# Patient Record
Sex: Female | Born: 1950 | Race: White | Hispanic: No | Marital: Married | Smoking: Former smoker
Health system: Southern US, Community
[De-identification: ages and names within clinical notes are randomized; demographics above are authoritative.]

## PROBLEM LIST (undated history)

## (undated) DIAGNOSIS — R87619 Unspecified abnormal cytological findings in specimens from cervix uteri: Secondary | ICD-10-CM

## (undated) DIAGNOSIS — I8390 Asymptomatic varicose veins of unspecified lower extremity: Secondary | ICD-10-CM

## (undated) DIAGNOSIS — E0590 Thyrotoxicosis, unspecified without thyrotoxic crisis or storm: Secondary | ICD-10-CM

## (undated) DIAGNOSIS — R0683 Snoring: Secondary | ICD-10-CM

## (undated) DIAGNOSIS — K5792 Diverticulitis of intestine, part unspecified, without perforation or abscess without bleeding: Secondary | ICD-10-CM

## (undated) DIAGNOSIS — K760 Fatty (change of) liver, not elsewhere classified: Secondary | ICD-10-CM

## (undated) DIAGNOSIS — A048 Other specified bacterial intestinal infections: Secondary | ICD-10-CM

## (undated) DIAGNOSIS — I700 Atherosclerosis of aorta: Secondary | ICD-10-CM

## (undated) DIAGNOSIS — I10 Essential (primary) hypertension: Secondary | ICD-10-CM

## (undated) DIAGNOSIS — H409 Unspecified glaucoma: Secondary | ICD-10-CM

## (undated) DIAGNOSIS — G2581 Restless legs syndrome: Secondary | ICD-10-CM

## (undated) DIAGNOSIS — F419 Anxiety disorder, unspecified: Secondary | ICD-10-CM

## (undated) DIAGNOSIS — Z8659 Personal history of other mental and behavioral disorders: Secondary | ICD-10-CM

## (undated) DIAGNOSIS — G4730 Sleep apnea, unspecified: Secondary | ICD-10-CM

## (undated) DIAGNOSIS — Z87898 Personal history of other specified conditions: Secondary | ICD-10-CM

## (undated) DIAGNOSIS — E079 Disorder of thyroid, unspecified: Secondary | ICD-10-CM

## (undated) DIAGNOSIS — K649 Unspecified hemorrhoids: Secondary | ICD-10-CM

## (undated) DIAGNOSIS — K589 Irritable bowel syndrome without diarrhea: Secondary | ICD-10-CM

## (undated) HISTORY — DX: Sleep apnea, unspecified: G47.30

## (undated) HISTORY — DX: Unspecified glaucoma: H40.9

## (undated) HISTORY — DX: Unspecified hemorrhoids: K64.9

## (undated) HISTORY — DX: Irritable bowel syndrome, unspecified: K58.9

## (undated) HISTORY — DX: Snoring: R06.83

## (undated) HISTORY — DX: Asymptomatic varicose veins of unspecified lower extremity: I83.90

## (undated) HISTORY — DX: Diverticulitis of intestine, part unspecified, without perforation or abscess without bleeding: K57.92

## (undated) HISTORY — DX: Atherosclerosis of aorta: I70.0

## (undated) HISTORY — PX: COLPOSCOPY: SHX161

## (undated) HISTORY — DX: Personal history of other mental and behavioral disorders: Z86.59

## (undated) HISTORY — DX: Unspecified abnormal cytological findings in specimens from cervix uteri: R87.619

## (undated) HISTORY — DX: Other specified bacterial intestinal infections: A04.8

## (undated) HISTORY — DX: Fatty (change of) liver, not elsewhere classified: K76.0

## (undated) HISTORY — DX: Thyrotoxicosis, unspecified without thyrotoxic crisis or storm: E05.90

---

## 1998-01-21 ENCOUNTER — Other Ambulatory Visit: Admission: RE | Admit: 1998-01-21 | Discharge: 1998-01-21 | Payer: Self-pay | Admitting: Obstetrics and Gynecology

## 1998-06-21 ENCOUNTER — Other Ambulatory Visit: Admission: RE | Admit: 1998-06-21 | Discharge: 1998-06-21 | Payer: Self-pay | Admitting: Obstetrics and Gynecology

## 1998-09-16 ENCOUNTER — Ambulatory Visit (HOSPITAL_COMMUNITY): Admission: RE | Admit: 1998-09-16 | Discharge: 1998-09-16 | Payer: Self-pay | Admitting: Obstetrics and Gynecology

## 1998-09-16 ENCOUNTER — Encounter: Payer: Self-pay | Admitting: Obstetrics and Gynecology

## 1998-12-20 ENCOUNTER — Other Ambulatory Visit: Admission: RE | Admit: 1998-12-20 | Discharge: 1998-12-20 | Payer: Self-pay | Admitting: Obstetrics and Gynecology

## 1999-02-10 ENCOUNTER — Encounter (INDEPENDENT_AMBULATORY_CARE_PROVIDER_SITE_OTHER): Payer: Self-pay | Admitting: Specialist

## 1999-02-10 ENCOUNTER — Other Ambulatory Visit: Admission: RE | Admit: 1999-02-10 | Discharge: 1999-02-10 | Payer: Self-pay | Admitting: Obstetrics and Gynecology

## 1999-06-19 ENCOUNTER — Other Ambulatory Visit: Admission: RE | Admit: 1999-06-19 | Discharge: 1999-06-19 | Payer: Self-pay | Admitting: Obstetrics and Gynecology

## 1999-09-26 ENCOUNTER — Emergency Department (HOSPITAL_COMMUNITY): Admission: EM | Admit: 1999-09-26 | Discharge: 1999-09-26 | Payer: Self-pay | Admitting: *Deleted

## 1999-10-04 ENCOUNTER — Emergency Department (HOSPITAL_COMMUNITY): Admission: EM | Admit: 1999-10-04 | Discharge: 1999-10-04 | Payer: Self-pay | Admitting: Emergency Medicine

## 1999-10-23 ENCOUNTER — Other Ambulatory Visit: Admission: RE | Admit: 1999-10-23 | Discharge: 1999-10-23 | Payer: Self-pay | Admitting: Obstetrics and Gynecology

## 2000-03-12 ENCOUNTER — Other Ambulatory Visit: Admission: RE | Admit: 2000-03-12 | Discharge: 2000-03-12 | Payer: Self-pay | Admitting: Obstetrics and Gynecology

## 2000-07-15 ENCOUNTER — Other Ambulatory Visit (HOSPITAL_COMMUNITY): Admission: RE | Admit: 2000-07-15 | Discharge: 2000-07-28 | Payer: Self-pay | Admitting: Psychiatry

## 2000-08-10 ENCOUNTER — Other Ambulatory Visit: Admission: RE | Admit: 2000-08-10 | Discharge: 2000-08-10 | Payer: Self-pay | Admitting: Obstetrics and Gynecology

## 2000-12-21 ENCOUNTER — Other Ambulatory Visit: Admission: RE | Admit: 2000-12-21 | Discharge: 2000-12-21 | Payer: Self-pay | Admitting: Obstetrics and Gynecology

## 2001-01-31 ENCOUNTER — Other Ambulatory Visit: Admission: RE | Admit: 2001-01-31 | Discharge: 2001-01-31 | Payer: Self-pay | Admitting: Obstetrics and Gynecology

## 2001-01-31 ENCOUNTER — Encounter (INDEPENDENT_AMBULATORY_CARE_PROVIDER_SITE_OTHER): Payer: Self-pay | Admitting: Specialist

## 2001-07-14 ENCOUNTER — Other Ambulatory Visit: Admission: RE | Admit: 2001-07-14 | Discharge: 2001-07-14 | Payer: Self-pay | Admitting: Obstetrics and Gynecology

## 2001-12-01 ENCOUNTER — Other Ambulatory Visit: Admission: RE | Admit: 2001-12-01 | Discharge: 2001-12-01 | Payer: Self-pay | Admitting: Obstetrics and Gynecology

## 2002-06-08 ENCOUNTER — Other Ambulatory Visit: Admission: RE | Admit: 2002-06-08 | Discharge: 2002-06-08 | Payer: Self-pay | Admitting: Obstetrics and Gynecology

## 2002-12-11 ENCOUNTER — Other Ambulatory Visit: Admission: RE | Admit: 2002-12-11 | Discharge: 2002-12-11 | Payer: Self-pay | Admitting: Obstetrics and Gynecology

## 2003-06-07 ENCOUNTER — Other Ambulatory Visit: Admission: RE | Admit: 2003-06-07 | Discharge: 2003-06-07 | Payer: Self-pay | Admitting: Obstetrics and Gynecology

## 2003-12-13 ENCOUNTER — Other Ambulatory Visit: Admission: RE | Admit: 2003-12-13 | Discharge: 2003-12-13 | Payer: Self-pay | Admitting: Obstetrics and Gynecology

## 2004-07-02 ENCOUNTER — Other Ambulatory Visit: Admission: RE | Admit: 2004-07-02 | Discharge: 2004-07-02 | Payer: Self-pay | Admitting: Obstetrics and Gynecology

## 2004-11-21 ENCOUNTER — Other Ambulatory Visit: Admission: RE | Admit: 2004-11-21 | Discharge: 2004-11-21 | Payer: Self-pay | Admitting: Obstetrics and Gynecology

## 2005-07-21 ENCOUNTER — Other Ambulatory Visit: Admission: RE | Admit: 2005-07-21 | Discharge: 2005-07-21 | Payer: Self-pay | Admitting: Obstetrics & Gynecology

## 2006-01-18 ENCOUNTER — Other Ambulatory Visit: Admission: RE | Admit: 2006-01-18 | Discharge: 2006-01-18 | Payer: Self-pay | Admitting: Obstetrics & Gynecology

## 2006-08-10 ENCOUNTER — Other Ambulatory Visit: Admission: RE | Admit: 2006-08-10 | Discharge: 2006-08-10 | Payer: Self-pay | Admitting: Obstetrics & Gynecology

## 2007-08-23 ENCOUNTER — Other Ambulatory Visit: Admission: RE | Admit: 2007-08-23 | Discharge: 2007-08-23 | Payer: Self-pay | Admitting: Obstetrics & Gynecology

## 2007-09-04 HISTORY — PX: LEEP: SHX91

## 2007-10-27 ENCOUNTER — Emergency Department (HOSPITAL_COMMUNITY): Admission: EM | Admit: 2007-10-27 | Discharge: 2007-10-27 | Payer: Self-pay | Admitting: Emergency Medicine

## 2008-01-04 ENCOUNTER — Other Ambulatory Visit: Admission: RE | Admit: 2008-01-04 | Discharge: 2008-01-04 | Payer: Self-pay | Admitting: Obstetrics & Gynecology

## 2008-04-18 ENCOUNTER — Other Ambulatory Visit: Admission: RE | Admit: 2008-04-18 | Discharge: 2008-04-18 | Payer: Self-pay | Admitting: Obstetrics & Gynecology

## 2008-08-27 ENCOUNTER — Other Ambulatory Visit: Admission: RE | Admit: 2008-08-27 | Discharge: 2008-08-27 | Payer: Self-pay | Admitting: Obstetrics & Gynecology

## 2009-10-03 ENCOUNTER — Encounter: Admission: RE | Admit: 2009-10-03 | Discharge: 2009-10-03 | Payer: Self-pay | Admitting: Gastroenterology

## 2011-11-11 LAB — HM PAP SMEAR

## 2011-11-13 LAB — HM DEXA SCAN: HM Dexa Scan: NORMAL

## 2012-08-29 LAB — HM MAMMOGRAPHY

## 2012-09-20 ENCOUNTER — Emergency Department (HOSPITAL_COMMUNITY): Payer: BC Managed Care – PPO

## 2012-09-20 ENCOUNTER — Emergency Department (HOSPITAL_COMMUNITY)
Admission: EM | Admit: 2012-09-20 | Discharge: 2012-09-20 | Disposition: A | Discharge status: Home / Self Care | Payer: BC Managed Care – PPO | Attending: Emergency Medicine | Admitting: Emergency Medicine

## 2012-09-20 ENCOUNTER — Encounter (HOSPITAL_COMMUNITY): Payer: Self-pay | Admitting: *Deleted

## 2012-09-20 DIAGNOSIS — R42 Dizziness and giddiness: Secondary | ICD-10-CM

## 2012-09-20 DIAGNOSIS — Z8669 Personal history of other diseases of the nervous system and sense organs: Secondary | ICD-10-CM | POA: Insufficient documentation

## 2012-09-20 DIAGNOSIS — I10 Essential (primary) hypertension: Secondary | ICD-10-CM | POA: Insufficient documentation

## 2012-09-20 DIAGNOSIS — R112 Nausea with vomiting, unspecified: Secondary | ICD-10-CM | POA: Insufficient documentation

## 2012-09-20 DIAGNOSIS — Z79899 Other long term (current) drug therapy: Secondary | ICD-10-CM | POA: Insufficient documentation

## 2012-09-20 DIAGNOSIS — Z7982 Long term (current) use of aspirin: Secondary | ICD-10-CM | POA: Insufficient documentation

## 2012-09-20 DIAGNOSIS — Z87891 Personal history of nicotine dependence: Secondary | ICD-10-CM | POA: Insufficient documentation

## 2012-09-20 DIAGNOSIS — E079 Disorder of thyroid, unspecified: Secondary | ICD-10-CM | POA: Insufficient documentation

## 2012-09-20 DIAGNOSIS — F411 Generalized anxiety disorder: Secondary | ICD-10-CM | POA: Insufficient documentation

## 2012-09-20 HISTORY — DX: Anxiety disorder, unspecified: F41.9

## 2012-09-20 HISTORY — DX: Essential (primary) hypertension: I10

## 2012-09-20 HISTORY — DX: Disorder of thyroid, unspecified: E07.9

## 2012-09-20 HISTORY — DX: Personal history of other specified conditions: Z87.898

## 2012-09-20 HISTORY — DX: Restless legs syndrome: G25.81

## 2012-09-20 LAB — CBC WITH DIFFERENTIAL/PLATELET
Basophils Absolute: 0 10*3/uL (ref 0.0–0.1)
Lymphocytes Relative: 14 % (ref 12–46)
Lymphs Abs: 1.2 10*3/uL (ref 0.7–4.0)
MCV: 89.6 fL (ref 78.0–100.0)
Neutro Abs: 7 10*3/uL (ref 1.7–7.7)
Platelets: 182 10*3/uL (ref 150–400)
RBC: 4.44 MIL/uL (ref 3.87–5.11)
RDW: 13.1 % (ref 11.5–15.5)
WBC: 8.5 10*3/uL (ref 4.0–10.5)

## 2012-09-20 LAB — URINALYSIS, ROUTINE W REFLEX MICROSCOPIC
Glucose, UA: NEGATIVE mg/dL
Leukocytes, UA: NEGATIVE
Nitrite: NEGATIVE
Specific Gravity, Urine: 1.022 (ref 1.005–1.030)
pH: 8 (ref 5.0–8.0)

## 2012-09-20 LAB — BASIC METABOLIC PANEL
CO2: 23 mEq/L (ref 19–32)
Calcium: 9.1 mg/dL (ref 8.4–10.5)
Chloride: 100 mEq/L (ref 96–112)
Creatinine, Ser: 0.57 mg/dL (ref 0.50–1.10)
GFR calc Af Amer: >90 mL/min (ref >90)
Sodium: 136 mEq/L (ref 135–145)

## 2012-09-20 MED ORDER — PANTOPRAZOLE SODIUM 40 MG IV SOLR
40.0000 mg | Freq: Once | INTRAVENOUS | Status: AC
  Administered 2012-09-20: 40 mg via INTRAVENOUS
  Filled 2012-09-20: qty 40

## 2012-09-20 MED ORDER — MECLIZINE HCL 50 MG PO TABS: 25.0000 mg | ORAL_TABLET | Freq: Three times a day (TID) | ORAL | Status: DC | PRN

## 2012-09-20 MED ORDER — ONDANSETRON 8 MG PO TBDP: ORAL_TABLET | ORAL | Status: DC

## 2012-09-20 MED ORDER — ONDANSETRON HCL 4 MG/2ML IJ SOLN
4.0000 mg | Freq: Once | INTRAMUSCULAR | Status: AC
  Administered 2012-09-20: 4 mg via INTRAVENOUS
  Filled 2012-09-20: qty 2

## 2012-09-20 MED ORDER — MECLIZINE HCL 25 MG PO TABS
25.0000 mg | ORAL_TABLET | Freq: Once | ORAL | Status: AC
  Administered 2012-09-20: 25 mg via ORAL
  Filled 2012-09-20: qty 1

## 2012-09-20 MED ORDER — SODIUM CHLORIDE 0.9 % IV BOLUS (SEPSIS)
1000.0000 mL | Freq: Once | INTRAVENOUS | Status: AC
  Administered 2012-09-20: 1000 mL via INTRAVENOUS

## 2012-09-20 MED ORDER — ALPRAZOLAM 0.5 MG PO TABS: 0.5000 mg | ORAL_TABLET | Freq: Three times a day (TID) | ORAL | Status: DC | PRN

## 2012-09-20 NOTE — ED Notes (Signed)
Patient unable to provide urine sample at this time

## 2012-09-20 NOTE — ED Notes (Signed)
Patient transported to CT 

## 2012-09-20 NOTE — ED Notes (Signed)
[id number]> Expected date:<BR> Expected time:<BR> Means of arrival:<BR> Comments:<BR> 62 y/o F dizziness x 8 hrs

## 2012-09-20 NOTE — ED Notes (Signed)
Per EMS, pt brought from home with reports of N/V and dizziness that has worsened since midnight. Per EMS, pt denies fever, diarrhea and cough.

## 2012-09-20 NOTE — Progress Notes (Signed)
Pt confirms pcp is [name] [facility] updated

## 2012-09-28 NOTE — ED Provider Notes (Signed)
History     CSN: [id number]  Arrival date & time 09/20/12  0809   First MD Initiated Contact with Patient 09/20/12 [phone]      Chief Complaint  Patient presents with  . Nausea  . Emesis  . Dizziness    (Consider location/radiation/quality/duration/timing/severity/associated sxs/prior treatment) HPI.... Nausea, dizziness for several hours.   No motor or sensory deficits. Patient is ambulatory. No fever, sweats, chills, stiff neck. Certain positions make it worse. Severity is mild to moderate  Past Medical History  Diagnosis Date  . Thyroid disease   . Hypertension   . H/O dizziness   . Anxiety   . Restless leg syndrome     Past Surgical History  Procedure Laterality Date  . None      History reviewed. No pertinent family history.  History  Substance Use Topics  . Smoking status: Former Smoker    Quit date: 09/21/1978  . Smokeless tobacco: Never Used  . Alcohol Use: No    OB History   Grav Para Term Preterm Abortions TAB SAB Ect Mult Living                  Review of Systems  All other systems reviewed and are negative.    Allergies  Sulfites  Home Medications   Current Outpatient Rx  Name  Route  Sig  Dispense  Refill  . ALPRAZolam (XANAX) 0.5 MG tablet   Oral   Take 0.5 mg by mouth 3 (three) times daily as needed for anxiety.         Marland Kitchen aspirin EC 81 MG tablet   Oral   Take 81 mg by mouth daily.         . Calcium-Magnesium-Vitamin D (CALCIUM MAGNESIUM PO)   Oral   Take 1 tablet by mouth daily.         . hydrochlorothiazide (HYDRODIURIL) 25 MG tablet   Oral   Take 25 mg by mouth daily.         Marland Kitchen levothyroxine (SYNTHROID, LEVOTHROID) 150 MCG tablet   Oral   Take 150 mcg by mouth daily.         . Multiple Vitamin (MULTIVITAMIN WITH MINERALS) TABS   Oral   Take 1 tablet by mouth daily.         Marland Kitchen omega-3 acid ethyl esters (LOVAZA) 1 G capsule   Oral   Take 1 g by mouth daily.         Marland Kitchen ALPRAZolam (XANAX) 0.5 MG tablet  Oral   Take 1 tablet (0.5 mg total) by mouth 3 (three) times daily as needed for anxiety.   20 tablet   0   . meclizine (ANTIVERT) 50 MG tablet   Oral   Take 0.5 tablets (25 mg total) by mouth 3 (three) times daily as needed for dizziness.   30 tablet   0   . ondansetron (ZOFRAN ODT) 8 MG disintegrating tablet      8mg  ODT q4 hours prn nausea   10 tablet   0     BP 137/68  Pulse 75  Temp(Src) 97.9 F (36.6 C) (Oral)  Resp 18  SpO2 99%  Physical Exam  Nursing note and vitals reviewed. Constitutional: She is oriented to person, place, and time. She appears well-developed and well-nourished.  HENT:  Head: Normocephalic and atraumatic.  Eyes: Conjunctivae and EOM are normal. Pupils are equal, round, and reactive to light.  Neck: Normal range of motion. Neck supple.  Cardiovascular: Normal  rate, regular rhythm and normal heart sounds.   Pulmonary/Chest: Effort normal and breath sounds normal.  Abdominal: Soft. Bowel sounds are normal.  Musculoskeletal: Normal range of motion.  Neurological: She is alert and oriented to person, place, and time.  Skin: Skin is warm and dry.  Psychiatric: She has a normal mood and affect.    ED Course  Procedures (including critical care time)  Labs Reviewed  BASIC METABOLIC PANEL - Abnormal; Notable for the following:    Glucose, Bld 182 (*)    All other components within normal limits  CBC WITH DIFFERENTIAL - Abnormal; Notable for the following:    Neutrophils Relative 83 (*)    All other components within normal limits  URINALYSIS, ROUTINE W REFLEX MICROSCOPIC - Abnormal; Notable for the following:    Ketones, ur 40 (*)    All other components within normal limits   No results found.   1. Vertigo       MDM  No frank neuro deficits. CT head negative. Screening labs normal. Discharge meds include meclizine, Xanax, Zofran         Donnetta Hutching, MD 09/28/12 [phone]

## 2012-11-17 ENCOUNTER — Ambulatory Visit (INDEPENDENT_AMBULATORY_CARE_PROVIDER_SITE_OTHER): Payer: BC Managed Care – PPO | Admitting: Neurology

## 2012-11-17 ENCOUNTER — Encounter: Payer: Self-pay | Admitting: Neurology

## 2012-11-17 ENCOUNTER — Other Ambulatory Visit: Payer: Self-pay | Admitting: *Deleted

## 2012-11-17 DIAGNOSIS — G4733 Obstructive sleep apnea (adult) (pediatric): Secondary | ICD-10-CM | POA: Insufficient documentation

## 2012-11-17 DIAGNOSIS — R0609 Other forms of dyspnea: Secondary | ICD-10-CM

## 2012-11-17 DIAGNOSIS — H8190 Unspecified disorder of vestibular function, unspecified ear: Secondary | ICD-10-CM

## 2012-11-17 DIAGNOSIS — J988 Other specified respiratory disorders: Secondary | ICD-10-CM

## 2012-11-17 DIAGNOSIS — R0989 Other specified symptoms and signs involving the circulatory and respiratory systems: Secondary | ICD-10-CM

## 2012-11-17 DIAGNOSIS — R0683 Snoring: Secondary | ICD-10-CM

## 2012-11-17 DIAGNOSIS — H8109 Meniere's disease, unspecified ear: Secondary | ICD-10-CM | POA: Insufficient documentation

## 2012-11-17 DIAGNOSIS — E669 Obesity, unspecified: Secondary | ICD-10-CM

## 2012-11-17 HISTORY — DX: Obstructive sleep apnea (adult) (pediatric): G47.33

## 2012-11-17 HISTORY — DX: Snoring: R06.83

## 2012-11-17 NOTE — Patient Instructions (Addendum)
I think overall you are doing fairly well but I do want to suggest a few things today:  If your vertigo recurs, we will consider a MRI brain.  Remember to drink plenty of fluid, eat healthy meals and do not skip any meals. Try to eat protein with a every meal and eat a healthy snack such as fruit or nuts in between meals. Try to keep a regular sleep-wake schedule and try to exercise daily, particularly in the form of walking, 20-30 minutes a day, if you can.   As far as your medications are concerned, I would like to suggest: no changes.    As far as diagnostic testing: consider a sleep study. Based on your symptoms and your exam I believe you are at risk for obstructive sleep apnea or OSA, and I think we should proceed with a sleep study to determine whether you do or do not have OSA and how severe it is. If you have more than mild OSA, I want you to consider treatment with CPAP. Please remember, the risks and ramifications of moderate to severe obstructive sleep apnea or OSA are: Cardiovascular disease, including congestive heart failure, stroke, difficult to control hypertension, arrhythmias, and even type 2 diabetes has been linked to untreated OSA. Sleep apnea causes disruption of sleep and sleep deprivation in most cases, which, in turn, can cause recurrent headaches, problems with memory, mood, concentration, focus, and vigilance. Most people with untreated sleep apnea report excessive daytime sleepiness, which can affect their ability to drive. Please do not drive if you feel sleepy. Call me if you decide to go through with the sleep study; we will be able to do this at [facility].   I would like to see you back in 6 months, sooner if we need to. Please call us with any interim questions, concerns, problems, updates or refill requests.  Brett Canales is my clinical assistant and will answer any of your questions and relay your messages to me and also relay most of my messages to you.  Our phone number is  [phone]. We also have an after hours call service for urgent matters and there is a physician on-call for urgent questions. For any emergencies you know to call 911 or go to the nearest emergency room.

## 2012-11-17 NOTE — Progress Notes (Signed)
Subjective:    Patient ID: Jodi [name] is a 62 y.o. female.  HPI  [name], MD, PhD [facility] [address] [address] [place], [place] [address]  Dear Dr. Katrinka Blazing,   I saw your patient, Jodi [name], upon your kind request in my neurologic clinic today for initial consultation of her vertigo. The patient is unaccompanied today. As you know, [name] is a very pleasant 62 year old right-handed woman with an underlying medical history of hypertension, hypothyroidism, hyperlipidemia and anxiety, who had a sudden attack of vertigo some 8 weeks ago. This started at 11:30 PM while doing the dishes and associated with nausea/vomiting and she did go to bed with similar symptoms and in the morning had to be taking to the ER via ambulance and was found to be dehydrated and had a CTH without contrast which was negative. She was given meclizine, which she took for about 2 days, but it made her very sleepy. She has been steadily improving. Her visual disturbance consisted of blurry vision and jumpy eyes. Years ago she had mild vertigo. She has recently been given new glasses with prism on the L and this has helped her vision. She did have a sinus congestion with mucus production during the month of February, approximately a month before the onset of her vertigo. She has been fatigued for about a month as well. She has had occasional tension type headache in the back of her head. She currently does not have any vertiginous symptoms. Never had a TIA or a Stroke, and denies one sided weakness, numbness, tingling, slurring of speech or droopy face and denies recurrent headaches.  She did not have any hearing loss, ear fullness, ear pain or tinnitus. Her current medications are facial, multivitamin, calcium, coenzyme Q10, probiotic, magnesium, baby aspirin, Synthroid, hydrochlorothiazide, turmeric. She was recently restarted on Wellbutrin, but could not tolerate the  long-acting as it exacerbated her anxiety and depression.  Of note, she denies any excessive daytime somnolence but feels tired during the day. Upon further asking, she snores and has done so for years. She has not been told that she has witnessed apneas but has woken herself up from her own snoring and choking episodes. She has considered a sleep study in the past. She is in the process of losing weight. She has lost a little bit. She is to see her back in a couple months for weight check and cholesterol recheck. She goes to bed late. Often after midnight. She does not feel rested in the mornings but denies recurrent morning headaches. She has had difficulty focusing.  Her Past Medical History Is Significant For: Past Medical History  Diagnosis Date  . Thyroid disease   . Hypertension   . H/O dizziness   . Anxiety   . Restless leg syndrome     Her Past Surgical History Is Significant For: Past Surgical History  Procedure Laterality Date  . None      Her Family History Is Significant For: No family history on file.  Her Social History Is Significant For: History   Social History  . Marital Status: Married    Spouse Name: N/A    Number of Children: N/A  . Years of Education: N/A   Social History Main Topics  . Smoking status: Former Smoker    Quit date: 09/21/1978  . Smokeless tobacco: Never Used  . Alcohol Use: No  . Drug Use: No  . Sexually Active: None   Other  Topics Concern  . None   Social History Narrative  . None    Her Allergies Are:  Allergies  Allergen Reactions  . Sulfites Anaphylaxis  :   Her Current Medications Are:  Outpatient Encounter Prescriptions as of 11/17/2012  Medication Sig Dispense Refill  . ALPRAZolam (XANAX) 0.5 MG tablet Take 1 tablet (0.5 mg total) by mouth 3 (three) times daily as needed for anxiety.  20 tablet  0  . aspirin EC 81 MG tablet Take 81 mg by mouth daily.      . Calcium-Magnesium-Vitamin D (CALCIUM MAGNESIUM PO) Take 1  tablet by mouth daily.      . hydrochlorothiazide (HYDRODIURIL) 25 MG tablet Take 25 mg by mouth daily.      Marland Kitchen levothyroxine (SYNTHROID, LEVOTHROID) 150 MCG tablet Take 150 mcg by mouth daily.      . Multiple Vitamin (MULTIVITAMIN WITH MINERALS) TABS Take 1 tablet by mouth daily.      Marland Kitchen omega-3 acid ethyl esters (LOVAZA) 1 G capsule Take 1 g by mouth daily.      . [DISCONTINUED] ALPRAZolam (XANAX) 0.5 MG tablet Take 0.5 mg by mouth 3 (three) times daily as needed for anxiety.      Marland Kitchen buPROPion (WELLBUTRIN) 100 MG tablet       . [DISCONTINUED] meclizine (ANTIVERT) 50 MG tablet Take 0.5 tablets (25 mg total) by mouth 3 (three) times daily as needed for dizziness.  30 tablet  0  . [DISCONTINUED] ondansetron (ZOFRAN ODT) 8 MG disintegrating tablet 8mg  ODT q4 hours prn nausea  10 tablet  0   No facility-administered encounter medications on file as of 11/17/2012.  :  Review of Systems  Constitutional: Positive for fatigue.  Cardiovascular: Positive for leg swelling.  Musculoskeletal: Positive for arthralgias.  Neurological:       Restless leg  Psychiatric/Behavioral: The patient is nervous/anxious.        Sleepines    Objective:  Neurologic Exam  Physical Exam Physical Examination:   Filed Vitals:   11/17/12 0957  BP: 151/77  Pulse: 94  Temp: 98.3 F (36.8 C)    General Examination: The patient is a very pleasant 62 y.o. female in no acute distress. She appears well-developed and well-nourished and well groomed. She is anxious appearing. She is overweight.   HEENT: Normocephalic, atraumatic, pupils are equal, round and reactive to light and accommodation. Funduscopic exam is normal with sharp disc margins noted. Extraocular tracking is good without limitation to gaze excursion or nystagmus noted. Normal smooth pursuit is noted. Hearing is grossly intact. Tympanic membranes are clear bilaterally. Face is symmetric with normal facial animation and normal facial sensation. Speech is clear  with no dysarthria noted. There is no hypophonia. There is no lip, neck/head, jaw or voice tremor. Neck is supple with full range of passive and active motion. There are no carotid bruits on auscultation. Oropharynx exam reveals: good dental hygiene and marked airway crowding, due to redundant soft palate and elongated uvula; I did not see the tip of her uvula and tongue is enlarged. Mallampati is class II. Tongue protrudes centrally and palate elevates symmetrically. Tonsils are small or absent. Neck size is 16 inches. She has no nystagmus or vertigo with sudden changes in posture.  Chest: Clear to auscultation without wheezing, rhonchi or crackles noted.  Heart: S1+S2+0, regular and normal without murmurs, rubs or gallops noted.   Abdomen: Soft, non-tender and non-distended with normal bowel sounds appreciated on auscultation.  Extremities: There is 2+ pitting edema  in the distal lower extremities bilaterally. Pedal pulses are intact.  Skin: Warm and dry without trophic changes noted. There are no varicose veins, but has multiple spider veins.  Musculoskeletal: exam reveals no obvious joint deformities, tenderness or joint swelling or erythema.   Neurologically:  Mental status: The patient is awake, alert and oriented in all 4 spheres. Her memory, attention, language and knowledge are appropriate. There is no aphasia, agnosia, apraxia or anomia. Speech is clear with normal prosody and enunciation. Thought process is linear. Mood is congruent and affect is normal.  Cranial nerves are as described above under HEENT exam. In addition, shoulder shrug is normal with equal shoulder height noted. Motor exam: Normal bulk, strength and tone is noted. There is no drift, tremor or rebound. Romberg is negative. Reflexes are 2+ throughout. Toes are downgoing bilaterally. Fine motor skills are intact with normal finger taps, normal hand movements, normal rapid alternating patting, normal foot taps and normal  foot agility.  Cerebellar testing shows no dysmetria or intention tremor on finger to nose testing. Heel to shin is unremarkable bilaterally. There is no truncal or gait ataxia.  Sensory exam is intact to light touch, pinprick, vibration, temperature sense and proprioception in the upper and lower extremities.  Gait, station and balance are unremarkable. No veering to one side is noted. No leaning to one side is noted. Posture is age-appropriate and stance is narrow based. No problems turning are noted. She turns en bloc. Tandem walk is unremarkable. Intact toe and heel stance is noted.               Assessment and Plan:   Assessment and Plan:  In summary, MAYAH URQUIDI is a very pleasant 62 y.o.-year old female with a history of sudden onset of vertigo in March. She has steadily improved. Given that she had a prodromal sinus congestion and gradual improvement associated fatigue reported I believe that she has had an episode of viral labyrinthitis or vestibular neuronitis rather than positional vertigo. I do believe that her exam looks well at this time and reassured her in that regard. I think watchful waiting is warranted at this time. If her vertigo recurs we will pursue a brain MRI but she is quite claustrophobic and worries about that. Also I believe she is at risk for obstructive sleep apnea. I suggested a sleep study but she would like to pursue weight loss a little bit more aggressively for now. If she has ongoing issues with tiredness and fatigue despite improvement of her vertigo symptoms I would like to pursue the sleep study with her. She is in agreement. If she decides to go ahead with a sleep study she will call us. For now I have suggested a six-month checkup to touch base with her again. She was in agreement. I answered all her questions and explained sleep test procedure and the risks and ramifications of untreated moderate to severe OSA including cardiovascular disease down the Road. I  also explained to her how obstructive sleep apnea may adversely impact her mood and her cognitive skills. Thank you very much for allowing me to participate in the care of this nice patient. If I can be of any further assistance to you please do not hesitate to call me at [phone].  Sincerely,   [name], MD, PhD

## 2012-11-30 ENCOUNTER — Ambulatory Visit: Payer: Self-pay | Admitting: Obstetrics & Gynecology

## 2012-12-01 ENCOUNTER — Encounter: Payer: Self-pay | Admitting: Obstetrics & Gynecology

## 2012-12-02 ENCOUNTER — Ambulatory Visit (INDEPENDENT_AMBULATORY_CARE_PROVIDER_SITE_OTHER): Payer: BC Managed Care – PPO | Admitting: Obstetrics & Gynecology

## 2012-12-02 ENCOUNTER — Encounter: Payer: Self-pay | Admitting: Obstetrics & Gynecology

## 2012-12-02 VITALS — BP 140/76 | Ht 65.75 in | Wt 209.0 lb

## 2012-12-02 DIAGNOSIS — Z124 Encounter for screening for malignant neoplasm of cervix: Secondary | ICD-10-CM

## 2012-12-02 DIAGNOSIS — Z01419 Encounter for gynecological examination (general) (routine) without abnormal findings: Secondary | ICD-10-CM

## 2012-12-02 DIAGNOSIS — Z0000 Encounter for general adult medical examination without abnormal findings: Secondary | ICD-10-CM

## 2012-12-02 LAB — POCT URINALYSIS DIPSTICK
Bilirubin, UA: NEGATIVE
Blood, UA: NEGATIVE
Leukocytes, UA: NEGATIVE
Nitrite, UA: NEGATIVE
Protein, UA: NEGATIVE
Urobilinogen, UA: NEGATIVE
pH, UA: 7

## 2012-12-02 NOTE — Patient Instructions (Signed)

## 2012-12-02 NOTE — Progress Notes (Signed)
Patient ID: Jodi [name], female   DOB: [birth year], 62 y.o.   MRN: [id number]  62 y.o. G1P1 MarriedCaucasianF here for annual exam.  No vaginal bleeding.  Has been to the ER for vertigo 09/20/12.  Diagnosed as viral vestibular neuritis.  She has also had some fatigue.  This is much better.  No LMP recorded. Patient is postmenopausal.          Sexually active: no  The current method of family planning is none.    Exercising: no  walking Smoker:  no  Health Maintenance: Pap:  5/13 neg History of abnormal Pap:  Yes, LEEP 3/09 Cin II/III MMG:  08/29/12 Colonoscopy:  Never.  Declines. BMD:   11/2011 TDaP:  2010 Screening Labs: PCP--Candace Smith early 2014, Hb today: PCP, Urine today: negative   reports that she quit smoking about 34 years ago. She has never used smokeless tobacco. She reports that she does not drink alcohol or use illicit drugs.  Past Medical History  Diagnosis Date  . Thyroid disease     hypothyroid  . Hypertension   . H/O dizziness   . Anxiety   . Restless leg syndrome   . Snoring 11/17/2012  . History of depression     incest survivor/dysthymia  . IBS (irritable bowel syndrome)     lactose intolerance  . Fatty liver disease, nonalcoholic     Past Surgical History  Procedure Laterality Date  . 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 Colposcopy  99,00,02,04,06  . Endometrial biopsy  2/09, 1/12    09-NEG, 2012-benign proliferative endometrium    Current Outpatient Prescriptions  Medication Sig Dispense Refill  . ALPRAZolam (XANAX) 0.5 MG tablet Take 1 tablet (0.5 mg total) by mouth 3 (three) times daily as needed for anxiety.  20 tablet  0  . aspirin EC 81 MG tablet Take 81 mg by mouth daily.      . Calcium-Magnesium-Vitamin D (CALCIUM MAGNESIUM PO) Take 1 tablet by mouth daily.      . hydrochlorothiazide (HYDRODIURIL) 25 MG tablet Take 25 mg by mouth daily.      Marland Kitchen levothyroxine (SYNTHROID, LEVOTHROID) 150 MCG tablet Take 150 mcg by mouth daily.      . Multiple  Vitamin (MULTIVITAMIN WITH MINERALS) TABS Take 1 tablet by mouth daily.      . Omega-3 Fatty Acids (FISH OIL) 1000 MG CAPS Take 1 capsule by mouth daily.       No current facility-administered medications for this visit.    Family History  Problem Relation Age of Onset  . Hypertension Father   . Diabetes Father   . Parkinson's disease Mother   . Dementia Mother   . Breast cancer Cousin     maternal side of family  . Dementia Brother   . Dementia Father     ROS:  Pertinent items are noted in HPI.  Otherwise, a comprehensive ROS was negative.  Exam:   BP 140/76  Ht 5' 5.75" (1.67 m)  Wt 209 lb (94.802 kg)  BMI 33.99 kg/m2  Weight change: no change  Height: 5' 5.75" (167 cm)  Ht Readings from Last 3 Encounters:  12/02/12 5' 5.75" (1.67 m)  11/17/12 5' 6.5" (1.689 m)    General appearance: alert, cooperative and appears stated age Head: Normocephalic, without obvious abnormality, atraumatic Neck: no adenopathy, supple, symmetrical, trachea midline and thyroid normal to inspection and palpation Lungs: clear to auscultation bilaterally Breasts: normal appearance, no masses or tenderness Heart:  regular rate and rhythm Abdomen: soft, non-tender; bowel sounds normal; no masses,  no organomegaly Extremities: extremities normal, atraumatic, no cyanosis or edema Skin: Skin color, texture, turgor normal. No rashes or lesions Lymph nodes: Cervical, supraclavicular, and axillary nodes normal. No abnormal inguinal nodes palpated Neurologic: Grossly normal   Pelvic: External genitalia:  no lesions              Urethra:  normal appearing urethra with no masses, tenderness or lesions              Bartholins and Skenes: normal                 Vagina: normal appearing vagina with normal color and discharge, no lesions              Cervix: no lesions and well healed from LEEP              Pap taken: yes Bimanual Exam:  Uterus:  normal size, contour, position, consistency, mobility,  non-tender              Adnexa: normal adnexa and no mass, fullness, tenderness               Rectovaginal: Confirms               Anus:  normal sphincter tone, no lesions  A:  Well Woman with normal exam PMP, No HRT 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 pap smear, only, obtained today Declines colonoscopy.  OC light given today. return annually or prn  An After Visit Summary was printed and given to the patient.

## 2012-12-07 ENCOUNTER — Telehealth: Payer: Self-pay

## 2012-12-07 NOTE — Telephone Encounter (Signed)
6/4 lmtcb//kn 

## 2012-12-14 NOTE — Telephone Encounter (Signed)
Calling for results from pap.

## 2012-12-15 NOTE — Telephone Encounter (Signed)
Returning call.

## 2012-12-16 ENCOUNTER — Telehealth: Payer: Self-pay | Admitting: *Deleted

## 2012-12-16 NOTE — Telephone Encounter (Signed)
Message copied by Alisa Graff on Fri Dec 16, 2012  6:45 PM ------      Message from: Jerene Bears      Created: Tue Dec 06, 2012  1:31 PM       02.  Inform normal. ------

## 2012-12-16 NOTE — Telephone Encounter (Signed)
Patient calling after looking at pap result on my chart.  Concerned about "atropic changes"  That is noted in comment section.  Discussed this is a normal finding in postmenopausal women as a lack of estrogen has an effect of the cells.  Reassured normal pap.

## 2012-12-23 NOTE — Telephone Encounter (Signed)
6/20 per sy,RN patient notified of her pap result.

## 2012-12-23 NOTE — Telephone Encounter (Signed)
Patient notified of pap results.

## 2013-01-27 ENCOUNTER — Encounter: Payer: Self-pay | Admitting: Obstetrics & Gynecology

## 2013-01-27 ENCOUNTER — Telehealth: Payer: Self-pay | Admitting: Obstetrics & Gynecology

## 2013-01-27 NOTE — Telephone Encounter (Signed)
Pt calling to get results of her IFOB sample.

## 2013-01-30 NOTE — Telephone Encounter (Signed)
Kelly can you help with this

## 2013-02-02 NOTE — Telephone Encounter (Signed)
Dr Hyacinth Meeker, I ran her IFOB and called her to let her know the results are negative. I will do an encounter for this.

## 2013-05-11 ENCOUNTER — Other Ambulatory Visit: Payer: Self-pay

## 2013-05-22 ENCOUNTER — Ambulatory Visit: Payer: BC Managed Care – PPO | Admitting: Neurology

## 2013-12-21 ENCOUNTER — Encounter: Payer: Self-pay | Admitting: Obstetrics & Gynecology

## 2013-12-21 ENCOUNTER — Ambulatory Visit (INDEPENDENT_AMBULATORY_CARE_PROVIDER_SITE_OTHER): Payer: BC Managed Care – PPO | Admitting: Obstetrics & Gynecology

## 2013-12-21 VITALS — BP 140/80 | HR 68 | Resp 16 | Ht 65.75 in | Wt 218.6 lb

## 2013-12-21 DIAGNOSIS — R87611 Atypical squamous cells cannot exclude high grade squamous intraepithelial lesion on cytologic smear of cervix (ASC-H): Secondary | ICD-10-CM

## 2013-12-21 DIAGNOSIS — Z1211 Encounter for screening for malignant neoplasm of colon: Secondary | ICD-10-CM

## 2013-12-21 DIAGNOSIS — Z124 Encounter for screening for malignant neoplasm of cervix: Secondary | ICD-10-CM

## 2013-12-21 DIAGNOSIS — Z0000 Encounter for general adult medical examination without abnormal findings: Secondary | ICD-10-CM

## 2013-12-21 DIAGNOSIS — Z01419 Encounter for gynecological examination (general) (routine) without abnormal findings: Secondary | ICD-10-CM

## 2013-12-21 LAB — HEMOGLOBIN, FINGERSTICK: Hemoglobin, fingerstick: 14.2 g/dL (ref 12.0–16.0)

## 2013-12-21 LAB — POCT URINALYSIS DIPSTICK
Bilirubin, UA: NEGATIVE
GLUCOSE UA: NEGATIVE
Ketones, UA: NEGATIVE
NITRITE UA: NEGATIVE
PROTEIN UA: NEGATIVE
RBC UA: NEGATIVE
UROBILINOGEN UA: NEGATIVE
pH, UA: 5

## 2013-12-21 NOTE — Patient Instructions (Signed)

## 2013-12-21 NOTE — Progress Notes (Signed)
63 y.o. G1P1 MarriedCaucasianF here for annual exam.  Doing well.  No vaginal bleeding.  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  No vaginal bleeding.  Does take BP at home.  Notes it is almost always higher here than normal.    Declines colonoscopy.  Will do a virtual.  D/W pt we may not be able to get this covered but can try.    Has chronic diarrhea and LLQ pain that has been present for "years".  She's never discussed this with me in the past.    Has appt with Dr. Tamala Julian next month.  Will do blood work with Dr. Tamala Julian when she goes for appt.     Patient's last menstrual period was 07/06/2010.          Sexually active: no  The current method of family planning is post menopausal status.    Exercising: yes  walking Smoker:  Former smoker 27 years ago  Health Maintenance: Pap:  12/02/12 WNL History of abnormal Pap:  Yes h/o LEEP-CIN II/III MMG:  09/05/13 3D-normal Colonoscopy:  none BMD:   5/13 1.3/-1.0 TDaP:  2010 Screening Labs: with PCP, Hb today: 14.2, Urine today: WBC-trace   reports that she quit smoking about 35 years ago. She has never used smokeless tobacco. She reports that she does not drink alcohol or use illicit drugs.  Past Medical History  Diagnosis Date  . Thyroid disease     hypothyroid  . Hypertension   . H/O dizziness   . Anxiety   . Restless leg syndrome   . Snoring 11/17/2012  . History of depression     incest survivor/dysthymia  . IBS (irritable bowel syndrome)     lactose intolerance  . Fatty liver disease, nonalcoholic   . Abnormal Pap smear of cervix     Past Surgical History  Procedure Laterality Date  . 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 Colposcopy  99,00,02,04,06  . Endometrial biopsy  2/09, 1/12    09-NEG, 2012-benign proliferative endometrium    Current Outpatient Prescriptions  Medication Sig Dispense Refill  . hydrochlorothiazide (HYDRODIURIL) 25 MG tablet Take 25 mg by mouth daily.      Marland Kitchen  levothyroxine (SYNTHROID, LEVOTHROID) 150 MCG tablet Take 150 mcg by mouth daily.      Marland Kitchen ALPRAZolam (XANAX) 0.5 MG tablet Take 1 tablet (0.5 mg total) by mouth 3 (three) times daily as needed for anxiety.  20 tablet  0  . aspirin EC 81 MG tablet Take 81 mg by mouth daily.      . Calcium-Magnesium-Vitamin D (CALCIUM MAGNESIUM PO) Take 1 tablet by mouth daily.      . Multiple Vitamin (MULTIVITAMIN WITH MINERALS) TABS Take 1 tablet by mouth daily.      . Omega-3 Fatty Acids (FISH OIL) 1000 MG CAPS Take 1 capsule by mouth daily.       No current facility-administered medications for this visit.    Family History  Problem Relation Age of Onset  . Hypertension Father   . Diabetes Father   . Parkinson's disease Mother   . Dementia Mother   . Breast cancer Cousin     maternal side of family  . Dementia Brother   . Dementia Father     ROS:  Pertinent items are noted in HPI.  Otherwise, a comprehensive ROS was negative.  Exam:   BP 140/80  Pulse 68  Resp 16  Ht 5'  5.75" (1.67 m)  Wt 218 lb 9.6 oz (99.156 kg)  BMI 35.55 kg/m2  LMP 07/06/2010    Height: 5' 5.75" (167 cm)  Ht Readings from Last 3 Encounters:  12/21/13 5' 5.75" (1.67 m)  12/02/12 5' 5.75" (1.67 m)  11/17/12 5' 6.5" (1.689 m)    General appearance: alert, cooperative and appears stated age Head: Normocephalic, without obvious abnormality, atraumatic Neck: no adenopathy, supple, symmetrical, trachea midline and thyroid normal to inspection and palpation Lungs: clear to auscultation bilaterally Breasts: normal appearance, no masses or tenderness Heart: regular rate and rhythm Abdomen: soft, non-tender; bowel sounds normal; no masses,  no organomegaly Extremities: extremities normal, atraumatic, no cyanosis or edema Skin: Skin color, texture, turgor normal. No rashes or lesions Lymph nodes: Cervical, supraclavicular, and axillary nodes normal. No abnormal inguinal nodes palpated Neurologic: Grossly normal   Pelvic:  External genitalia:  no lesions              Urethra:  normal appearing urethra with no masses, tenderness or lesions              Bartholins and Skenes: normal                 Vagina: normal appearing vagina with normal color and discharge, no lesions              Cervix: no lesions              Pap taken: yes Bimanual Exam:  Uterus:  normal size, contour, position, consistency, mobility, non-tender              Adnexa: normal adnexa and no mass, fullness, tenderness               Rectovaginal: Confirms               Anus:  normal sphincter tone, no lesions  A:  Well Woman with normal exam 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  S/P LEEP. Chronic diarrhea and LLQ pain. Menopausal on HRT Considering shingles vaccines  P:   Mammogram yearly.  Does 3D due to density. pap smear obtained today. Consents to a virtual colonoscopy this year.  Will see if can get this covered.  IFOB given. return annually or prn  An After Visit Summary was printed and given to the patient.

## 2013-12-26 LAB — IPS PAP SMEAR ONLY

## 2013-12-27 NOTE — Addendum Note (Signed)
Addended by: Megan Salon on: 12/27/2013 10:06 AM   Modules accepted: Orders

## 2013-12-28 LAB — IPS HPV ON A LIQUID BASED SPECIMEN

## 2014-01-03 ENCOUNTER — Telehealth: Payer: Self-pay | Admitting: Emergency Medicine

## 2014-01-03 DIAGNOSIS — R87810 Cervical high risk human papillomavirus (HPV) DNA test positive: Principal | ICD-10-CM

## 2014-01-03 DIAGNOSIS — R87610 Atypical squamous cells of undetermined significance on cytologic smear of cervix (ASC-US): Secondary | ICD-10-CM

## 2014-01-03 NOTE — Telephone Encounter (Signed)
Message copied by Michele Mcalpine on Wed Jan 03, 2014 10:57 AM ------      Message from: Megan Salon      Created: Fri Dec 29, 2013  6:53 AM       Inform pt pap abnormal and HR HPV was positive.  Needs repeat colposcopy.  Please schedule. ------

## 2014-01-04 NOTE — Telephone Encounter (Signed)
Patient notified of message from Dr. Sabra Heck. She is agreeable to scheduling colposcopy. Brief description of procedure given to patient, she has had colposcopy testing prior and remembers it.  Colposcopy pre-procedure instructions given. Advised 800 mg of Motrin with food one hour prior to appointment. Motrin=Advil=Ibuprofen Can take 800 mg (Can purchase over the counter, you will need four 200 mg pills). Make sure to eat a meal before appointment and drink plenty of fluids. Advised will need to cancel within 24 hours or will have $100.00 no show fee placed to account. Patient verbalized understanding of instructions.  Patient menopausal. Scheduled for 02/02/14 with Dr. Sabra Heck.   Routing to provider for final review. Patient agreeable to disposition. Will close encounter  Colposcopy ordered and advised patient she would receive call from our office with benefits to discuss. Routing to [employer] for [employer].

## 2014-01-04 NOTE — Telephone Encounter (Signed)
Message copied by Michele Mcalpine on Thu Jan 04, 2014 11:45 AM ------      Message from: Megan Salon      Created: Fri Dec 29, 2013  6:53 AM       Inform pt pap abnormal and HR HPV was positive.  Needs repeat colposcopy.  Please schedule. ------

## 2014-01-04 NOTE — Telephone Encounter (Signed)
Returning a call from [place].

## 2014-01-04 NOTE — Telephone Encounter (Signed)
Message left to return call to Obediah Welles at [phone].    

## 2014-01-04 NOTE — Telephone Encounter (Signed)
Hx of HGSIL, LEEP in 2009 Post menopausal.

## 2014-01-04 NOTE — Telephone Encounter (Signed)
Patient returned tracys call at lunch

## 2014-01-04 NOTE — Telephone Encounter (Deleted)
Message copied by Michele Mcalpine on Thu Jan 04, 2014 11:48 AM ------      Message from: Megan Salon      Created: Fri Dec 29, 2013  6:53 AM       Inform pt pap abnormal and HR HPV was positive.  Needs repeat colposcopy.  Please schedule. ------

## 2014-01-15 ENCOUNTER — Encounter: Payer: Self-pay | Admitting: Obstetrics & Gynecology

## 2014-01-15 NOTE — Progress Notes (Signed)
Pt did not return ifob kit.  Letter sent.

## 2014-02-02 ENCOUNTER — Ambulatory Visit (INDEPENDENT_AMBULATORY_CARE_PROVIDER_SITE_OTHER): Payer: BC Managed Care – PPO | Admitting: Obstetrics & Gynecology

## 2014-02-02 VITALS — BP 142/62 | HR 92 | Resp 16 | Wt 216.2 lb

## 2014-02-02 DIAGNOSIS — R87610 Atypical squamous cells of undetermined significance on cytologic smear of cervix (ASC-US): Secondary | ICD-10-CM

## 2014-02-02 DIAGNOSIS — R87810 Cervical high risk human papillomavirus (HPV) DNA test positive: Secondary | ICD-10-CM

## 2014-02-02 NOTE — Progress Notes (Signed)
Subjective:     Patient ID: Jodi [name], female   DOB: 1950/12/30, 63 y.o.   MRN: [id number]  HPI 63 yo G1P1 MWF here for colposcopy due to ASCUS H pap with +HR HPV obtained at [facility] on 12/21/13.  Pt with hx of abnormal Pap smear and subsequent LEEP 2009 due to CIN 2/3.  HPV testing has not been performed except once during the last 6 years but at that time it was negative.  Pap and HPV testing d/w pt today.  All questions answered.  Ready to proceed with colposcopy.  Review of Systems  All other systems reviewed and are negative.      Objective:   Physical Exam  Constitutional: She is oriented to person, place, and time. She appears well-developed and well-nourished.  Genitourinary: Vagina normal. There is no rash or tenderness on the right labia. There is no rash or tenderness on the left labia.    Lymphadenopathy:       Right: No inguinal adenopathy present.       Left: No inguinal adenopathy present.  Neurological: She is alert and oriented to person, place, and time.  Skin: Skin is warm and dry.  Psychiatric: She has a normal mood and affect.   Speculum placed.  3% acetic acid applied to cervix for >45 seconds.  Cervix visualized with both 7.5X and 15X magnification.  Green filter also used.  Lugols solution was not used.  Findings:  No AWE.  Biopsy:  3 and 12.  ECC:  was performed.  Monsel's was needed.  Excellent hemostasis was present.  Pt tolerated procedure well and all instruments were removed.  Findings noted above on picture of cervix.     Assessment:     ASCUS H pap     Plan:     Biopsies at 3 and 12 pending.  ECC pending.  As there were not significant findings on colposcopy today, feel pt will probably need repeat colpo and pap in six months.  Results will be called to pt.

## 2014-02-07 LAB — IPS OTHER TISSUE BIOPSY

## 2014-02-12 ENCOUNTER — Telehealth: Payer: Self-pay | Admitting: Obstetrics & Gynecology

## 2014-02-12 DIAGNOSIS — IMO0002 Reserved for concepts with insufficient information to code with codable children: Secondary | ICD-10-CM

## 2014-02-12 NOTE — Telephone Encounter (Signed)
Message copied by Michele Mcalpine on Mon Feb 12, 2014  5:20 PM ------      Message from: Megan Salon      Created: Thu Feb 08, 2014  9:54 PM       Please call pt.  Biopsies showed HPV changes only but ECC showed high grade dysplasia.  Needs repeat LEEP. ------

## 2014-02-12 NOTE — Telephone Encounter (Signed)
Spoke with patient. She is given message from Dr. Sabra Heck and is agreeable to scheduling LEEP. Requests Morning appointment. Appointment for 03/08/14 at 1000 with Dr. Sabra Heck.  pre-procedure instructions given. Motrin instructions given. Motrin=Advil=Ibuprofen Can take 800 mg (Can purchase over the counter, you will need four 200 mg pills). Take with food. Make sure to eat a meal and drink fluids prior to appointment.    Advised she would be contacted with out of pocket costs.   Routing to Dr. Sabra Heck  cc Felipa Emory.

## 2014-02-12 NOTE — Telephone Encounter (Signed)
Patient is calling to see if results are back from colpo

## 2014-02-13 NOTE — Telephone Encounter (Signed)
PR: $35 °

## 2014-02-15 ENCOUNTER — Encounter: Payer: Self-pay | Admitting: Obstetrics & Gynecology

## 2014-02-16 LAB — FECAL OCCULT BLOOD, IMMUNOCHEMICAL: IFOBT: NEGATIVE

## 2014-02-16 MED ORDER — ALPRAZOLAM 0.5 MG PO TABS: 0.5000 mg | ORAL_TABLET | ORAL | Status: DC | PRN

## 2014-02-16 NOTE — Addendum Note (Signed)
Addended by: Abelino Derrick C on: 02/16/2014 11:02 AM   Modules accepted: Orders

## 2014-02-16 NOTE — Telephone Encounter (Signed)
Rx for xanax will be faxed to pharmacy.  Pt requests due to upcoming procedure.

## 2014-03-08 ENCOUNTER — Ambulatory Visit (INDEPENDENT_AMBULATORY_CARE_PROVIDER_SITE_OTHER): Payer: BC Managed Care – PPO | Admitting: Obstetrics & Gynecology

## 2014-03-08 VITALS — BP 132/68 | HR 80 | Resp 20 | Wt 215.4 lb

## 2014-03-08 DIAGNOSIS — R6889 Other general symptoms and signs: Secondary | ICD-10-CM

## 2014-03-08 DIAGNOSIS — IMO0002 Reserved for concepts with insufficient information to code with codable children: Secondary | ICD-10-CM

## 2014-03-08 NOTE — Progress Notes (Signed)
Patient ID: Jodi [name], female   DOB: [birth year], 63 y.o.   MRN: [id number]  63 y.o. Married White Female G1P1 here for LEEP.  H/O LEEP several years ago with negative margins.  Subsequent follow up paps have all been negative.  At [facility] 12/21/13, ASCUS-H pap with +HR HPV was noted.  Colposcopy with biopsy on 02/02/14 showed CIN 1 with HPV changes at two biopsy sites and ECC positive for HGSIL cells.  LEEP recommended.  Pt here for this today.  All questions answered.  Patient's last menstrual period was 07/06/2010. Contraception:  PMP state  Pre-procedure vitals: Blood pressure 132/68, pulse 80, resp. rate 20, weight 215 lb 6.4 oz (97.705 kg), last menstrual period 07/06/2010.   Procedure explained and patient's questions were invited and answered.   Consent form signed.   Procedure Set-up: Grounding pad located leftthigh.  Cautery settings:50 cut/55 coagulation.  Suction applied to coated speculum.  Procedure:  Speculum placed with good visualization of the cervix.  Colposcopy performed showing:  acetowhite lesion(s) noted at 12 o'clock.  Cervix anesthetized using 2% Xylocaine with 1:100,000units Epinephrine.  8 cc's used.Entire transition zone with 12 x 78mm loop in 1passes.  Specimen(s) placed on cork and labeled for pathology.  Hemostasis obtained with ball cautery and Monsel's solution.  EBL:  Minimal  Complications: none  Patient tolerated procedure well and left the office in satisfactory condition.  Plan:  After visit summary given.  Repeat pap will be planned pending pathology results.  If positive margin present, I will recommend hysterectomy.

## 2014-03-13 LAB — IPS OTHER TISSUE BIOPSY

## 2014-03-14 ENCOUNTER — Telehealth: Payer: Self-pay

## 2014-03-14 ENCOUNTER — Encounter: Payer: Self-pay | Admitting: Obstetrics & Gynecology

## 2014-03-14 NOTE — Telephone Encounter (Signed)
Spoke with patient. Results given as seen below. Patient is agreeable and verbalizes understanding. 06 recall entered. Patient scheduled for March 10th at 8:30am with Dr.Miller for follow up pap. Patient agreeable to date and time. Patient would like to know if experiencing fatigue is normal after LEEP. 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 Every day since then I have been less tired. I just want to make sure this is normal." Advised patient some fatigue is normal as the body is recovering from procedure and if she over did work that could be a factor as well. Patient denies bleeding, pain, nausea, being light headed, and fever. "I feel fine otherwise. I feel like every day I have more energy." Advised patient to rest when she can and drink plenty of fluids. Advised this is should continue to decrease. Advised if it does not or worsens will need to call back. Advised if any new symptoms arise will also need to call to let Dr.Miller know. Patient is agreeable and verbalizes understanding.  Anything further for patient Dr.Miller?

## 2014-03-14 NOTE — Telephone Encounter (Signed)
Dr.Miller, discussed this message with patient in telephone encounter from 9/9 which I routed to you for approval and further recommendations.   Routing to provider for final review. Patient agreeable to disposition. Will close encounter

## 2014-03-14 NOTE — Telephone Encounter (Signed)
No.  Thank you.  Good advice given.  Encounter closed.

## 2014-03-14 NOTE — Telephone Encounter (Signed)
Message copied by Jasmine Awe on Wed Mar 14, 2014 10:47 AM ------      Message from: Megan Salon      Created: Wed Mar 14, 2014  8:47 AM       Please inform pt LEEP pathology showed CIN 1 only.  Repeat Pap 6 months.  Place in recall as well please. ------

## 2014-05-07 ENCOUNTER — Encounter: Payer: Self-pay | Admitting: Obstetrics & Gynecology

## 2014-09-07 ENCOUNTER — Telehealth: Payer: Self-pay | Admitting: Obstetrics & Gynecology

## 2014-09-07 NOTE — Telephone Encounter (Signed)
Patient is scheduled for Pap only.  No LEEP at this time.

## 2014-09-07 NOTE — Telephone Encounter (Signed)
Patient will bring new id card to appt next week

## 2014-09-07 NOTE — Telephone Encounter (Signed)
Left message for patient to call back. Need updated insurance information. Patient is scheduled for LEEP 03.10.2016.

## 2014-09-13 ENCOUNTER — Ambulatory Visit (INDEPENDENT_AMBULATORY_CARE_PROVIDER_SITE_OTHER): Payer: BC Managed Care – PPO | Admitting: Obstetrics & Gynecology

## 2014-09-13 ENCOUNTER — Telehealth: Payer: Self-pay | Admitting: Obstetrics & Gynecology

## 2014-09-13 ENCOUNTER — Encounter: Payer: Self-pay | Admitting: Obstetrics & Gynecology

## 2014-09-13 VITALS — BP 138/80 | HR 68 | Resp 16 | Wt 212.4 lb

## 2014-09-13 DIAGNOSIS — R896 Abnormal cytological findings in specimens from other organs, systems and tissues: Secondary | ICD-10-CM | POA: Diagnosis not present

## 2014-09-13 DIAGNOSIS — IMO0002 Reserved for concepts with insufficient information to code with codable children: Secondary | ICD-10-CM

## 2014-09-13 MED ORDER — HYDROCORTISONE 2.5 % RE CREA: 1.0000 "application " | TOPICAL_CREAM | Freq: Two times a day (BID) | RECTAL | Status: DC

## 2014-09-13 MED ORDER — LIDOCAINE 5 % EX OINT
1.0000 | TOPICAL_OINTMENT | Freq: Four times a day (QID) | CUTANEOUS | Status: DC | PRN
Start: 2014-09-13 — End: 2018-05-06

## 2014-09-13 NOTE — Telephone Encounter (Signed)
Left message to call [place] at [phone].  Per OV with Dr.Miller hydrocortisone 2.5% cream to be sent to pharmacy.Hydrocortisone 2.5% cream sent to [facility] on file.

## 2014-09-13 NOTE — Telephone Encounter (Signed)
Patient is returning a call to Kait[place]. °

## 2014-09-13 NOTE — Telephone Encounter (Signed)
Patient was seen today and called to say the pharmacy does not have her Rx for hydrocortisone on file. Pharmacy on file is correct.

## 2014-09-13 NOTE — Progress Notes (Signed)
Subjective:     Patient ID: Jodi [name], female   DOB: 04/03/1951, 64 y.o.   MRN: [id number]  HPI 64 yo G1P1 MWF here for follow up pap smear.  Pt has now undergone two LEEPs.  Last one did have positive margins.  Pt and I have discussed consideration of hysterectomy if follow up testing is positive.  Pt has questions about 16/18 testing.  Pt would consider hysterectomy more seriously if either of these were positive.    Pt had recent hemorrhoid flare.  Using witch hazel.  Has used proctosol in the past and she would like a prescription for them.  Pt has never used topical Lidocaine.  D/w pt how to use this and she would like to try this as well.    Review of Systems  All other systems reviewed and are negative.      Objective:   Physical Exam  Constitutional: She is oriented to person, place, and time. She appears well-developed and well-nourished.  Genitourinary: Vagina normal. There is no rash, tenderness or lesion on the right labia. There is no rash, tenderness or lesion on the left labia. Cervix exhibits no motion tenderness and no discharge.  Lymphadenopathy:       Right: No inguinal adenopathy present.       Left: No inguinal adenopathy present.  Neurological: She is alert and oriented to person, place, and time.  Skin: Skin is warm and dry.  Psychiatric: She has a normal mood and affect.       Assessment:     H/O abnormal pap and LEEP x 2  Hemorrhoids    Plan:     Pap and 16/18 testing pending  Lidocaine and Hydrocortisone 2.5% cream rx to pharmacy

## 2014-09-13 NOTE — Telephone Encounter (Signed)
Spoke with patient. Advised rx for hydrocortisone cream has been sent in. Patient is agreeable.  Routing to provider for final review. Patient agreeable to disposition. Will close encounter

## 2014-09-15 LAB — IPS HPV GENOTYPING 16/18

## 2014-09-18 LAB — IPS PAP SMEAR ONLY

## 2014-09-21 NOTE — Addendum Note (Signed)
Addended by: Megan Salon on: 09/21/2014 04:43 PM   Modules accepted: Orders

## 2014-10-29 ENCOUNTER — Other Ambulatory Visit: Payer: Self-pay | Admitting: *Deleted

## 2014-10-29 DIAGNOSIS — I83813 Varicose veins of bilateral lower extremities with pain: Secondary | ICD-10-CM

## 2014-10-31 ENCOUNTER — Encounter: Payer: Self-pay | Admitting: Vascular Surgery

## 2014-11-01 ENCOUNTER — Ambulatory Visit (HOSPITAL_COMMUNITY)
Admission: RE | Admit: 2014-11-01 | Discharge: 2014-11-01 | Disposition: A | Discharge status: Home / Self Care | Payer: BC Managed Care – PPO | Source: Ambulatory Visit | Attending: Vascular Surgery | Admitting: Vascular Surgery

## 2014-11-01 ENCOUNTER — Encounter: Payer: Self-pay | Admitting: Vascular Surgery

## 2014-11-01 ENCOUNTER — Ambulatory Visit (INDEPENDENT_AMBULATORY_CARE_PROVIDER_SITE_OTHER): Payer: BC Managed Care – PPO | Admitting: Vascular Surgery

## 2014-11-01 VITALS — BP 124/80 | HR 99 | Wt 212.0 lb

## 2014-11-01 DIAGNOSIS — I83813 Varicose veins of bilateral lower extremities with pain: Secondary | ICD-10-CM | POA: Diagnosis not present

## 2014-11-01 NOTE — Progress Notes (Signed)
[facility] [place] HISTORY AND PHYSICAL   History of Present Illness:  Patient is a 64 y.o. year old female who presents for evaluation of symptomatic varicose veins. Patient states he had increased swelling of both lower extremities over the last several years. She says this is been slowly progressive since the birth of her son 52 years ago. She has also noted increased prominent varicose veins in both lower extremities. She states that the swelling its worse as the day proceed to she is on her feet all day. She also developed some fullness and achiness in the calf as well. This does improve slightly with elevation of the legs. Her legs are better in the morning usually.  She denies prior history of DVT. She denies prior family history of varicose veins. Other medical problems include hypothyroidism, hypertension, anxiety, depression, irritable bowel syndrome all of which are currently stable.  Past Medical History  Diagnosis Date  . Thyroid disease     hypothyroid  . Hypertension   . H/O dizziness   . Anxiety   . Restless leg syndrome   . Snoring 11/17/2012  . History of depression     incest survivor/dysthymia  . IBS (irritable bowel syndrome)     lactose intolerance  . Fatty liver disease, nonalcoholic   . Abnormal Pap smear of cervix     Past Surgical History  Procedure Laterality Date  . 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 Colposcopy  99,00,02,04,06  . Endometrial biopsy  2/09, 1/12    09-NEG, 2012-benign proliferative endometrium    Social History History  Substance Use Topics  . Smoking status: Former Smoker    Quit date: 09/21/1978  . Smokeless tobacco: Never Used     Comment: quit 27 years ago  . Alcohol Use: No    Family History Family History  Problem Relation Age of Onset  . Hypertension Father   . Diabetes Father   . Dementia Father   . Cancer Father   . Hyperlipidemia Father   . Parkinson's disease Mother   . Dementia Mother   .  Hypertension Mother   . Breast cancer Cousin     maternal side of family  . Dementia Brother     Allergies  Allergies  Allergen Reactions  . Sulfites Anaphylaxis  . Iodine   . Macrodantin [Nitrofurantoin Macrocrystal] Nausea Only and Other (See Comments)    Headache  . Shellfish Allergy      Current Outpatient Prescriptions  Medication Sig Dispense Refill  . Calcium-Magnesium-Vitamin D (CALCIUM MAGNESIUM PO) Take 1 tablet by mouth daily.    . Cholecalciferol (VITAMIN D PO) Take by mouth daily.    . hydrochlorothiazide (HYDRODIURIL) 25 MG tablet Take 25 mg by mouth daily.    . hydrocortisone (ANUSOL-HC) 2.5 % rectal cream Place 1 application rectally 2 (two) times daily. 30 g 0  . KRILL OIL PO Take by mouth daily.    Marland Kitchen LEVOXYL 137 MCG tablet Take 137 mcg by mouth daily before breakfast.   0  . lidocaine (XYLOCAINE) 5 % ointment Apply 1 application topically 4 (four) times daily as needed. 1.25 g 0  . Multiple Vitamin (MULTIVITAMIN WITH MINERALS) TABS Take 1 tablet by mouth daily.     No current facility-administered medications for this visit.    ROS:   General:  No weight loss, Fever, chills  HEENT: No recent headaches, no nasal bleeding, no visual changes, no sore throat  Neurologic: No dizziness,  blackouts, seizures. No recent symptoms of stroke or mini- stroke. No recent episodes of slurred speech, or temporary blindness.  Cardiac: No recent episodes of chest pain/pressure, no shortness of breath at rest.  No shortness of breath with exertion.  Denies history of atrial fibrillation or irregular heartbeat  Vascular: No history of rest pain in feet.  No history of claudication.  No history of non-healing ulcer, No history of DVT   Pulmonary: No home oxygen, no productive cough, no hemoptysis,  No asthma or wheezing  Musculoskeletal:  [ ]  Arthritis, [ ]  Low back pain,  [ ]  Joint pain  Hematologic:No history of hypercoagulable state.  No history of easy bleeding.  No  history of anemia  Gastrointestinal: No hematochezia or melena,  No gastroesophageal reflux, no trouble swallowing  Urinary: [ ]  chronic Kidney disease, [ ]  on HD - [ ]  MWF or [ ]  TTHS, [ ]  Burning with urination, [ ]  Frequent urination, [ ]  Difficulty urinating;   Skin: No rashes  Psychological: No history of anxiety,  No history of depression   Physical Examination  Filed Vitals:   11/01/14 1036 11/01/14 1043  BP: 152/83 124/80  Pulse: 99   Weight: 212 lb (96.163 kg)   SpO2: 95%     Body mass index is 34.48 kg/(m^2).  General:  Alert and oriented, no acute distress HEENT: Normal Neck: No bruit or JVD Pulmonary: Clear to auscultation bilaterally Cardiac: Regular Rate and Rhythm without murmur Abdomen: Soft, non-tender, non-distended, no mass Skin: No rash, cluster of 4-5 mm varicosities left medial calf left lateral calf reticular cluster 3 cm diameter, bilateral posterior calf distal third has clusters of 4 mm diameter varicosities. Extremity Pulses:  2+ radial, brachial, femoral, dorsalis pedis, posterior tibial pulses bilaterally Musculoskeletal: No deformity trace pretibial edema bilaterally  Neurologic: Upper and lower extremity motor 5/5 and symmetric  DATA:  Patient had a venous duplex scan today. This showed no evidence of deep vein reflux. She did have evidence of superficial vein reflux in the greater saphenous vein bilaterally. Left side was 4-10 mm right side was 4-8 mm   ASSESSMENT:  Patient with superficial venous reflux with symptoms of pain and swelling. She was given a prescription today for bilateral lower extremity compression stockings. She'll get symptomatic relief from these. She wishes to consider intervention with laser ablation. She also wishes to consider possible sclerotherapy down the road if her symptoms are not completely improved with laser ablation alone. She'll return in 3 months time for consideration of this.   PLAN:  See above  Ruta Hinds, MD [facility] Office: [phone] Pager: [phone]

## 2015-01-01 DIAGNOSIS — R1011 Right upper quadrant pain: Secondary | ICD-10-CM | POA: Insufficient documentation

## 2015-01-04 ENCOUNTER — Encounter: Payer: Self-pay | Admitting: Obstetrics & Gynecology

## 2015-01-04 ENCOUNTER — Ambulatory Visit (INDEPENDENT_AMBULATORY_CARE_PROVIDER_SITE_OTHER): Payer: BC Managed Care – PPO | Admitting: Obstetrics & Gynecology

## 2015-01-04 VITALS — BP 158/82 | HR 72 | Resp 16 | Ht 65.5 in | Wt 204.0 lb

## 2015-01-04 DIAGNOSIS — E039 Hypothyroidism, unspecified: Secondary | ICD-10-CM | POA: Insufficient documentation

## 2015-01-04 DIAGNOSIS — Z1211 Encounter for screening for malignant neoplasm of colon: Secondary | ICD-10-CM

## 2015-01-04 DIAGNOSIS — Z0000 Encounter for general adult medical examination without abnormal findings: Secondary | ICD-10-CM

## 2015-01-04 DIAGNOSIS — E7800 Pure hypercholesterolemia, unspecified: Secondary | ICD-10-CM | POA: Insufficient documentation

## 2015-01-04 DIAGNOSIS — F419 Anxiety disorder, unspecified: Secondary | ICD-10-CM | POA: Insufficient documentation

## 2015-01-04 DIAGNOSIS — Z01419 Encounter for gynecological examination (general) (routine) without abnormal findings: Secondary | ICD-10-CM

## 2015-01-04 DIAGNOSIS — Z124 Encounter for screening for malignant neoplasm of cervix: Secondary | ICD-10-CM | POA: Diagnosis not present

## 2015-01-04 DIAGNOSIS — I10 Essential (primary) hypertension: Secondary | ICD-10-CM | POA: Insufficient documentation

## 2015-01-04 LAB — POCT URINALYSIS DIPSTICK
Bilirubin, UA: NEGATIVE
Blood, UA: NEGATIVE
Glucose, UA: NEGATIVE
KETONES UA: NEGATIVE
NITRITE UA: NEGATIVE
PH UA: 5
Protein, UA: NEGATIVE
Urobilinogen, UA: NEGATIVE

## 2015-01-04 NOTE — Progress Notes (Signed)
64 y.o. G1P1 MarriedCaucasianF here for annual exam.  Doing well.  Pt having RUQ pain and was recently diagnosed with a gall stone.  Considering gall bladder surgery.  Seeing Dr. Zella Richer.    Patient's last menstrual period was 07/06/2010.          Sexually active: No.  The current method of family planning is post menopausal status.    Exercising: Yes.    walking and hand weights Smoker:  Former smoker-years ago  Health Maintenance: Pap:  09/13/14 WNL/positive subtype 18 History of abnormal Pap:  yes MMG:  09/10/14 3D-normal Colonoscopy:  none BMD:   5/13 TDaP:  2010 Screening Labs: PCP, Hb today: PCP, Urine today: WBC-trace   reports that she quit smoking about 36 years ago. She has never used smokeless tobacco. She reports that she does not drink alcohol or use illicit drugs.  Past Medical History  Diagnosis Date  . Thyroid disease     hypothyroid  . Hypertension   . H/O dizziness   . Anxiety   . Restless leg syndrome   . Snoring 11/17/2012  . History of depression     incest survivor/dysthymia  . IBS (irritable bowel syndrome)     lactose intolerance  . Fatty liver disease, nonalcoholic   . Abnormal Pap smear of cervix   . Varicose vein     Past Surgical History  Procedure Laterality Date  . 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 Colposcopy  99,00,02,04,06  . Endometrial biopsy  2/09, 1/12    09-NEG, 2012-benign proliferative endometrium    Current Outpatient Prescriptions  Medication Sig Dispense Refill  . Calcium-Magnesium-Vitamin D (CALCIUM MAGNESIUM PO) Take 1 tablet by mouth daily.    . Cholecalciferol (VITAMIN D PO) Take by mouth daily.    . hydrochlorothiazide (HYDRODIURIL) 25 MG tablet Take 25 mg by mouth daily.    . hydrocortisone (ANUSOL-HC) 2.5 % rectal cream Place 1 application rectally 2 (two) times daily. 30 g 0  . KRILL OIL PO Take by mouth daily.    Marland Kitchen LEVOXYL 137 MCG tablet Take 137 mcg by mouth daily before breakfast.   0  . lidocaine (XYLOCAINE) 5 %  ointment Apply 1 application topically 4 (four) times daily as needed. 1.25 g 0  . Multiple Vitamin (MULTIVITAMIN WITH MINERALS) TABS Take 1 tablet by mouth daily.    . Probiotic Product (PROBIOTIC DAILY PO)     . Turmeric 500 MG CAPS      No current facility-administered medications for this visit.    Family History  Problem Relation Age of Onset  . Hypertension Father   . Diabetes Father   . Dementia Father   . Cancer Father   . Hyperlipidemia Father   . Parkinson's disease Mother   . Dementia Mother   . Hypertension Mother   . Breast cancer Cousin     maternal side of family  . Dementia Brother     ROS:  Pertinent items are noted in HPI.  Otherwise, a comprehensive ROS was negative.  Exam:   BP 158/82 mmHg  Pulse 72  Resp 16  Ht 5' 5.5" (1.664 m)  Wt 204 lb (92.534 kg)  BMI 33.42 kg/m2  LMP 07/06/2010  Weight change:-15#   Height: 5' 5.5" (166.4 cm)  Ht Readings from Last 3 Encounters:  01/04/15 5' 5.5" (1.664 m)  12/21/13 5' 5.75" (1.67 m)  12/02/12 5' 5.75" (1.67 m)    General appearance: alert, cooperative and appears stated  age Head: Normocephalic, without obvious abnormality, atraumatic Neck: no adenopathy, supple, symmetrical, trachea midline and thyroid normal to inspection and palpation Lungs: clear to auscultation bilaterally Breasts: normal appearance, no masses or tenderness Heart: regular rate and rhythm Abdomen: soft, non-tender; bowel sounds normal; no masses,  no organomegaly Extremities: extremities normal, atraumatic, no cyanosis or edema Skin: Skin color, texture, turgor normal. No rashes or lesions Lymph nodes: Cervical, supraclavicular, and axillary nodes normal. No abnormal inguinal nodes palpated Neurologic: Grossly normal   Pelvic: External genitalia:  no lesions              Urethra:  normal appearing urethra with no masses, tenderness or lesions              Bartholins and Skenes: normal                 Vagina: normal appearing  vagina with normal color and discharge, no lesions              Cervix: no lesions              Pap taken: Yes.   Bimanual Exam:  Uterus:  normal size, contour, position, consistency, mobility, non-tender              Adnexa: normal adnexa and no mass, fullness, tenderness               Rectovaginal: Confirms               Anus:  normal sphincter tone, no lesions  Chaperone was present for exam.  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 Menopausal, no HRT Recent gallstone issues   P: Mammogram yearly. Does 3D due to density. pap smear obtained today.  Did have +subtype 18 testing that was positive 3/16 IFOB given. Declines colonoscopy, again. Labs with Dr. Tamala Julian 3/16 for AEX.  Labs were all normal then.   return annually or prn

## 2015-01-04 NOTE — Addendum Note (Signed)
Addended by: Megan Salon on: 01/04/2015 02:50 PM   Modules accepted: Miquel Dunn

## 2015-01-09 LAB — IPS PAP TEST WITH REFLEX TO HPV

## 2015-01-10 ENCOUNTER — Encounter: Payer: Self-pay | Admitting: Obstetrics & Gynecology

## 2015-01-11 ENCOUNTER — Telehealth: Payer: Self-pay

## 2015-01-11 NOTE — Telephone Encounter (Signed)
Lmtcb//kn 

## 2015-01-11 NOTE — Telephone Encounter (Signed)
-----   Message from Megan Salon, MD sent at 01/10/2015  9:09 AM EDT ----- Inform pap was negative.  Needs to have Pap and HR HPV testing 1 year.  08 recall.  Thanks.

## 2015-01-21 NOTE — Telephone Encounter (Signed)
Patient notified of results. Appointment for 6 month pap scheduled-see result note.//kn

## 2015-01-22 ENCOUNTER — Encounter: Payer: Self-pay | Admitting: Obstetrics & Gynecology

## 2015-02-05 ENCOUNTER — Encounter: Payer: Self-pay | Admitting: Obstetrics & Gynecology

## 2015-02-11 ENCOUNTER — Encounter: Payer: Self-pay | Admitting: Vascular Surgery

## 2015-02-11 LAB — FECAL OCCULT BLOOD, IMMUNOCHEMICAL: IFOBT: NEGATIVE

## 2015-02-12 ENCOUNTER — Encounter: Payer: Self-pay | Admitting: Vascular Surgery

## 2015-02-12 ENCOUNTER — Ambulatory Visit (INDEPENDENT_AMBULATORY_CARE_PROVIDER_SITE_OTHER): Payer: BC Managed Care – PPO | Admitting: Vascular Surgery

## 2015-02-12 VITALS — BP 121/77 | HR 88 | Temp 97.4°F | Resp 14 | Ht 66.0 in | Wt 206.0 lb

## 2015-02-12 DIAGNOSIS — I83893 Varicose veins of bilateral lower extremities with other complications: Secondary | ICD-10-CM

## 2015-02-12 NOTE — Progress Notes (Signed)
Subjective:     Patient ID: Jodi [name], female   DOB: 1951-02-19, 64 y.o.   MRN: [id number]  HPI this 64 year old female returns for further evaluation and follow-up of her bilateral edema and painful varicosities due to bilateral great saphenous reflux. She has tried long-leg elastic compression stockings 20-30 millimeter gradient as well as elevation and ibuprofen continues to have progressive edema as the day goes by. She also has a heaviness and aching discomfort left worse than right. She has no history of DVT or thrombophlebitis or stasis ulcers or bleeding. Her symptoms are beginning to affect her daily living because of the swelling and pain.  Past Medical History  Diagnosis Date  . Thyroid disease     hypothyroid  . Hypertension   . H/O dizziness   . Anxiety   . Restless leg syndrome   . Snoring 11/17/2012  . History of depression     incest survivor/dysthymia  . IBS (irritable bowel syndrome)     lactose intolerance  . Fatty liver disease, nonalcoholic   . Abnormal Pap smear of cervix   . Varicose vein   . Varicose veins     History  Substance Use Topics  . Smoking status: Former Smoker    Quit date: 09/21/1978  . Smokeless tobacco: Never Used     Comment: quit 27 years ago  . Alcohol Use: No    Family History  Problem Relation Age of Onset  . Hypertension Father   . Diabetes Father   . Dementia Father   . Cancer Father   . Hyperlipidemia Father   . Parkinson's disease Mother   . Dementia Mother   . Hypertension Mother   . Breast cancer Cousin     maternal side of family  . Dementia Brother     Allergies  Allergen Reactions  . Sulfites Anaphylaxis  . Iodine   . Macrodantin [Nitrofurantoin Macrocrystal] Nausea Only and Other (See Comments)    Headache  . Shellfish Allergy      Current outpatient prescriptions:  .  Calcium-Magnesium-Vitamin D (CALCIUM MAGNESIUM PO), Take 1 tablet by mouth daily., Disp: , Rfl:  .  Cholecalciferol (VITAMIN D PO),  Take by mouth daily., Disp: , Rfl:  .  hydrochlorothiazide (HYDRODIURIL) 25 MG tablet, Take 25 mg by mouth daily., Disp: , Rfl:  .  hydrocortisone (ANUSOL-HC) 2.5 % rectal cream, Place 1 application rectally 2 (two) times daily., Disp: 30 g, Rfl: 0 .  KRILL OIL PO, Take by mouth daily., Disp: , Rfl:  .  LEVOXYL 137 MCG tablet, Take 137 mcg by mouth daily before breakfast. , Disp: , Rfl: 0 .  lidocaine (XYLOCAINE) 5 % ointment, Apply 1 application topically 4 (four) times daily as needed., Disp: 1.25 g, Rfl: 0 .  Multiple Vitamin (MULTIVITAMIN WITH MINERALS) TABS, Take 1 tablet by mouth daily., Disp: , Rfl:  .  Probiotic Product (PROBIOTIC DAILY PO), , Disp: , Rfl:  .  Turmeric 500 MG CAPS, , Disp: , Rfl:   Filed Vitals:   02/12/15 0936  BP: 121/77  Pulse: 88  Temp: 97.4 F (36.3 C)  Resp: 14  Height: 5\' 6"  (1.676 m)  Weight: 206 lb (93.441 kg)  SpO2: 98%    Body mass index is 33.27 kg/(m^2).        \   Review of Systems denies chest pain, dyspnea on exertion, PND, orthopnea, hemoptysis, claudication     Objective:   Physical Exam BP 121/77 mmHg  Pulse 88  Temp(Src) 97.4 F (36.3 C)  Resp 14  Ht 5\' 6"  (1.676 m)  Wt 206 lb (93.441 kg)  BMI 33.27 kg/m2  SpO2 98%  LMP 07/06/2010  Saphenous system with network of reticular and spider veins down towards medial malleolus. No active ulcer. Right leg with diffuse reticular and spider veins over great saphenous system below the knee with 1+ edema. Mild hyperpigmentation bilaterally with no active ulcer. Bilateral 3+ dorsalis pedis pulse palpable.  Today I reviewed the duplex scan of the venous system performed 11/01/2014 and also performed a bedside sono site examination independently Patient does have gross reflux in bilateral great saphenous veins with varicosities being supplied by this venous system. The veins are large caliber.      Assessment:     painful varicosities and distal edema bilaterally due to gross  reflux bilateral great saphenous veins. Symptoms are affecting patient's daily living and are resistant to conservative measures including long-leg elastic compression stockings, elevation, and ibuprofen     Plan:         patient needs #1 laser ablation left great saphenous vein plus tender 20 stab phlebectomy of painful varicosities followed by 2 courses of sclerotherapy and #2 laser ablation right great saphenous vein followed by 2 courses of sclerotherapy. We'll proceed with precertification to perform this in the near future and hopefully relieve her symptoms of pain and swelling

## 2015-02-18 ENCOUNTER — Other Ambulatory Visit: Payer: Self-pay | Admitting: *Deleted

## 2015-02-18 DIAGNOSIS — I83893 Varicose veins of bilateral lower extremities with other complications: Secondary | ICD-10-CM

## 2015-02-22 ENCOUNTER — Encounter: Payer: Self-pay | Admitting: Vascular Surgery

## 2015-02-25 ENCOUNTER — Ambulatory Visit (INDEPENDENT_AMBULATORY_CARE_PROVIDER_SITE_OTHER): Payer: BC Managed Care – PPO | Admitting: Vascular Surgery

## 2015-02-25 ENCOUNTER — Encounter: Payer: Self-pay | Admitting: Vascular Surgery

## 2015-02-25 VITALS — BP 140/90 | HR 113 | Temp 97.8°F | Resp 16 | Ht 65.5 in | Wt 202.0 lb

## 2015-02-25 DIAGNOSIS — I83893 Varicose veins of bilateral lower extremities with other complications: Secondary | ICD-10-CM

## 2015-02-25 NOTE — Progress Notes (Signed)
Laser Ablation Procedure    Date: 02/25/2015   Jodi [name] DOB:[birth year]  Consent signed: Yes    Surgeon:  Dr. Nelda Severe. Kellie Simmering  Procedure: Laser Ablation: left Greater Saphenous Vein  BP 140/90 mmHg  Pulse 113  Temp(Src) 97.8 F (36.6 C)  Resp 16  Ht 5' 5.5" (1.664 m)  Wt 202 lb (91.627 kg)  BMI 33.09 kg/m2  SpO2 100%  LMP 07/06/2010  Tumescent Anesthesia: 460 cc 0.9% NaCl with 50 cc Lidocaine HCL with 1% Epi and 15 cc 8.4% NaHCO3  Local Anesthesia: 6 cc Lidocaine HCL and NaHCO3 (ratio 2:1)  Pulsed Mode: 15 watts, 561ms delay, 1.0 duration  Total Energy:  2052            Total Pulses:    138            Total Time: 2:17    Stab Phlebectomy: 10-20 Sites: Calf  Patient tolerated procedure well  Notes:   Description of Procedure:  After marking the course of the secondary varicosities, the patient was placed on the operating table in the supine position, and the left leg was prepped and draped in sterile fashion.   Local anesthetic was administered and under ultrasound guidance the saphenous vein was accessed with a micro needle and guide wire; then the mirco puncture sheath was placed.  A guide wire was inserted saphenofemoral junction , followed by a 5 french sheath.  The position of the sheath and then the laser fiber below the junction was confirmed using the ultrasound.  Tumescent anesthesia was administered along the course of the saphenous vein using ultrasound guidance. The patient was placed in Trendelenburg position and protective laser glasses were placed on patient and staff, and the laser was fired at 15 watts continuous mode advancing 1-50mm/second for a total of 2052 joules.   For stab phlebectomies, local anesthetic was administered at the previously marked varicosities, and tumescent anesthesia was administered around the vessels.  Ten to 20 stab wounds were made using the tip of an 11 blade. And using the vein hook, the phlebectomies were performed using a  hemostat to avulse the varicosities.  Adequate hemostasis was achieved.     Steri strips were applied to the stab wounds and ABD pads and thigh high compression stockings were applied.  Ace wrap bandages were applied over the phlebectomy sites and at the top of the saphenofemoral junction. Blood loss was less than 15 cc.  The patient ambulated out of the operating room having tolerated the procedure well.

## 2015-02-25 NOTE — Progress Notes (Signed)
Subjective:     Patient ID: Jodi [name], female   DOB: [birth year], 64 y.o.   MRN: [id number]  HPI 64 year old female had laser ablation left great saphenous vein from the mid to proximal calf to near the saphenofemoral junction +10-20 stab phlebectomy of painful varicosities performed under local tumescent anesthesia. A total of 2050 J of energy was utilized. She tolerated the procedure well. Review of Systems     Objective:   Physical Exam BP 140/90 mmHg  Pulse 113  Temp(Src) 97.8 F (36.6 C)  Resp 16  Ht 5' 5.5" (1.664 m)  Wt 202 lb (91.627 kg)  BMI 33.09 kg/m2  SpO2 100%  LMP 07/06/2010       Assessment:     Well-tolerated laser ablation left great saphenous vein with multiple stab phlebectomy of painful varicosities for gross reflux great saphenous system    Plan:     Return in 1 week for venous duplex exam to confirm closure left great saphenous vein We'll then proceed in the near future for similar procedure and contralateral right leg

## 2015-02-26 ENCOUNTER — Encounter: Payer: Self-pay | Admitting: Vascular Surgery

## 2015-02-26 ENCOUNTER — Telehealth: Payer: Self-pay | Admitting: *Deleted

## 2015-02-26 NOTE — Telephone Encounter (Signed)
Pt doing well. No bleeding or problems. Following all instructions.

## 2015-03-01 ENCOUNTER — Encounter: Payer: Self-pay | Admitting: Vascular Surgery

## 2015-03-04 ENCOUNTER — Ambulatory Visit (HOSPITAL_COMMUNITY)
Admission: RE | Admit: 2015-03-04 | Discharge: 2015-03-04 | Disposition: A | Discharge status: Home / Self Care | Payer: BC Managed Care – PPO | Source: Ambulatory Visit | Attending: Vascular Surgery | Admitting: Vascular Surgery

## 2015-03-04 ENCOUNTER — Other Ambulatory Visit: Payer: Self-pay | Admitting: *Deleted

## 2015-03-04 ENCOUNTER — Ambulatory Visit (INDEPENDENT_AMBULATORY_CARE_PROVIDER_SITE_OTHER): Payer: BC Managed Care – PPO | Admitting: Vascular Surgery

## 2015-03-04 ENCOUNTER — Encounter: Payer: Self-pay | Admitting: Vascular Surgery

## 2015-03-04 VITALS — BP 145/90 | HR 85 | Temp 97.1°F | Resp 14 | Ht 65.5 in | Wt 202.0 lb

## 2015-03-04 DIAGNOSIS — I83893 Varicose veins of bilateral lower extremities with other complications: Secondary | ICD-10-CM

## 2015-03-04 DIAGNOSIS — I83892 Varicose veins of left lower extremities with other complications: Secondary | ICD-10-CM

## 2015-03-04 DIAGNOSIS — I10 Essential (primary) hypertension: Secondary | ICD-10-CM | POA: Diagnosis not present

## 2015-03-04 NOTE — Progress Notes (Signed)
Subjective:     Patient ID: Jodi [name], female   DOB: 04-19-1951, 64 y.o.   MRN: [id number]  HPI this 64 year old female returns 1 week post-laser ablation left great saphenous vein plus multiple stab phlebectomy of painful varicosities. She tolerated the procedure well. She's had mild discomfort in the mid distal thigh with some bruising but states that this is improving. Stab phlebectomy sites have caused no pain. She's had no distal edema. Review of Systems     Objective:   Physical Exam BP 145/90 mmHg  Pulse 85  Temp(Src) 97.1 F (36.2 C)  Resp 14  Ht 5' 5.5" (1.664 m)  Wt 202 lb (91.627 kg)  BMI 33.09 kg/m2  SpO2 97%  LMP 07/06/2010  Gen. well-developed well-nourished female in no apparent distress alert and oriented 3 Lungs no rhonchi or wheezing Left leg with mild ecchymosis in mid medial thigh. Stab phlebectomy sites and calf are healing nicely. 3+ dorsalis pedis pulses palpable. No distal edema noted.  Today I ordered a venous duplex exam of the left leg which I reviewed and interpreted. There is no DVT. There is total closure of the great saphenous vein up to near the saphenofemoral junction beginning in the proximal calf     Assessment:     Successful well-tolerated laser ablation left great saphenous vein with multiple stab phlebectomy of painful varicosities    Plan:     Will return in the near future for laser ablation right great saphenous vein to be followed by sclerotherapy

## 2015-03-04 NOTE — Progress Notes (Signed)
Filed Vitals:   03/04/15 1004 03/04/15 1008  BP: 148/80 145/90  Pulse: 66 85  Temp: 97.1 F (36.2 C)   Resp: 14   Height: 5' 5.5" (1.664 m)   Weight: 202 lb (91.627 kg)   SpO2: 97%

## 2015-04-04 ENCOUNTER — Encounter: Payer: Self-pay | Admitting: Vascular Surgery

## 2015-04-08 ENCOUNTER — Encounter: Payer: Self-pay | Admitting: Vascular Surgery

## 2015-04-08 ENCOUNTER — Ambulatory Visit (INDEPENDENT_AMBULATORY_CARE_PROVIDER_SITE_OTHER): Payer: BC Managed Care – PPO | Admitting: Vascular Surgery

## 2015-04-08 VITALS — BP 141/82 | HR 87 | Temp 98.0°F | Resp 16 | Ht 65.5 in | Wt 205.0 lb

## 2015-04-08 DIAGNOSIS — I83893 Varicose veins of bilateral lower extremities with other complications: Secondary | ICD-10-CM

## 2015-04-08 HISTORY — PX: ENDOVENOUS ABLATION SAPHENOUS VEIN W/ LASER: SUR449

## 2015-04-08 NOTE — Progress Notes (Signed)
Subjective:     Patient ID: JAYCIE KREGEL, female   DOB: 1950-09-28, 64 y.o.   MRN: [id number]  HPI this 64 year old female had laser ablation right great saphenous vein performed under local tumescent anesthesia. A total of 1541 J of energy was utilized. She tolerated the procedure well. Review of Systems     Objective:   Physical Exam BP 141/82 mmHg  Pulse 87  Temp(Src) 98 F (36.7 C)  Resp 16  Ht 5' 5.5" (1.664 m)  Wt 205 lb (92.987 kg)  BMI 33.58 kg/m2  SpO2 99%  LMP 07/06/2010       Assessment:     Well-tolerated laser ablation right great saphenous vein from distal thigh to near the saphenofemoral junction performed under local tumescent anesthesia    Plan:     Return in 1 week for venous duplex exam to confirm closure right great saphenous vein Patient will then be scheduled for sclerotherapy to complete her treatment regimen

## 2015-04-08 NOTE — Progress Notes (Signed)
Laser Ablation Procedure    Date: 04/08/2015   Jodi [name] DOB:01-19-1951  Consent signed: Yes    Surgeon:  Dr. Nelda Severe. Kellie Simmering  Procedure: Laser Ablation: right Greater Saphenous Vein  BP 141/82 mmHg  Pulse 87  Temp(Src) 98 F (36.7 C)  Resp 16  Ht 5' 5.5" (1.664 m)  Wt 205 lb (92.987 kg)  BMI 33.58 kg/m2  SpO2 99%  LMP 07/06/2010  Tumescent Anesthesia: 210 cc 0.9% NaCl with 50 cc Lidocaine HCL with 1% Epi and 15 cc 8.4% NaHCO3  Local Anesthesia: 4 cc Lidocaine HCL and NaHCO3 (ratio 2:1)  Pulsed Mode: 15 watts, 562ms delay, 1.0 duration  Total Energy: 1541             Total Pulses:     103           Total Time: 1:43    Patient tolerated procedure well  Notes:   Description of Procedure:  After marking the course of the secondary varicosities, the patient was placed on the operating table in the supine position, and the right leg was prepped and draped in sterile fashion.   Local anesthetic was administered and under ultrasound guidance the saphenous vein was accessed with a micro needle and guide wire; then the mirco puncture sheath was placed.  A guide wire was inserted saphenofemoral junction , followed by a 5 french sheath.  The position of the sheath and then the laser fiber below the junction was confirmed using the ultrasound.  Tumescent anesthesia was administered along the course of the saphenous vein using ultrasound guidance. The patient was placed in Trendelenburg position and protective laser glasses were placed on patient and staff, and the laser was fired at 15 watts continuous mode advancing 1-57mm/second for a total of 1541 joules.     Steri strips were applied to the stab wounds and ABD pads and thigh high compression stockings were applied.  Ace wrap bandages were applied over the phlebectomy sites and at the top of the saphenofemoral junction. Blood loss was less than 15 cc.  The patient ambulated out of the operating room having tolerated the procedure  well.

## 2015-04-09 ENCOUNTER — Telehealth: Payer: Self-pay | Admitting: *Deleted

## 2015-04-09 ENCOUNTER — Encounter: Payer: Self-pay | Admitting: Vascular Surgery

## 2015-04-09 NOTE — Telephone Encounter (Signed)
Pt doing well. No problems. Following all instructions. 

## 2015-04-15 ENCOUNTER — Encounter: Payer: Self-pay | Admitting: Vascular Surgery

## 2015-04-16 ENCOUNTER — Ambulatory Visit (INDEPENDENT_AMBULATORY_CARE_PROVIDER_SITE_OTHER): Payer: Self-pay | Admitting: Vascular Surgery

## 2015-04-16 ENCOUNTER — Encounter: Payer: Self-pay | Admitting: Vascular Surgery

## 2015-04-16 ENCOUNTER — Ambulatory Visit (HOSPITAL_COMMUNITY)
Admission: RE | Admit: 2015-04-16 | Discharge: 2015-04-16 | Disposition: A | Discharge status: Home / Self Care | Payer: BC Managed Care – PPO | Source: Ambulatory Visit | Attending: Vascular Surgery | Admitting: Vascular Surgery

## 2015-04-16 VITALS — BP 133/86 | HR 90 | Temp 97.3°F | Resp 18 | Ht 65.5 in | Wt 209.9 lb

## 2015-04-16 DIAGNOSIS — I83893 Varicose veins of bilateral lower extremities with other complications: Secondary | ICD-10-CM | POA: Diagnosis not present

## 2015-04-16 DIAGNOSIS — Z48812 Encounter for surgical aftercare following surgery on the circulatory system: Secondary | ICD-10-CM | POA: Insufficient documentation

## 2015-04-16 NOTE — Progress Notes (Signed)
Patient presents today for one-week follow-up of laser ablation of right great saphenous vein. She had similar treatment of left great saphenous vein prior. She reports minimal discomfort associated with this. She has been compliant with her compression garments.  Physical exam she has minimal bruising and minimal discomfort over the medial aspect of her right thigh. No skin irritation.  Venous duplex today shows closure of her great saphenous vein from the proximal calf to 0.8 cm from the saphenofemoral junction and no evidence of DVT  Impression and plan successful staged bilateral treatment of great saphenous vein reflux. We will continue her right leg compression for one additional week and then when necessary. Will see Thea Silversmith, RN for sclerotherapy of telangiectasia in one month.

## 2015-05-13 ENCOUNTER — Encounter: Payer: Self-pay | Admitting: *Deleted

## 2015-05-15 ENCOUNTER — Ambulatory Visit (INDEPENDENT_AMBULATORY_CARE_PROVIDER_SITE_OTHER): Payer: BC Managed Care – PPO | Admitting: *Deleted

## 2015-05-15 DIAGNOSIS — I83893 Varicose veins of bilateral lower extremities with other complications: Secondary | ICD-10-CM | POA: Diagnosis not present

## 2015-05-15 NOTE — Progress Notes (Signed)
X=.3% Sotradecol administered with a 27g butterfly.  Patient received a total of 24cc.  Treated all areas of concern for this nice lady. Easy access. Tol well. She has one more ins covered tx which we will schedule next week.  Photos: No.  Compression stockings applied: Yes.

## 2015-05-16 ENCOUNTER — Encounter: Payer: Self-pay | Admitting: Vascular Surgery

## 2015-06-21 ENCOUNTER — Ambulatory Visit (INDEPENDENT_AMBULATORY_CARE_PROVIDER_SITE_OTHER): Payer: BC Managed Care – PPO | Admitting: Obstetrics & Gynecology

## 2015-06-21 ENCOUNTER — Encounter: Payer: Self-pay | Admitting: Obstetrics & Gynecology

## 2015-06-21 VITALS — BP 138/76 | HR 86 | Resp 16 | Ht 65.5 in | Wt 209.0 lb

## 2015-06-21 DIAGNOSIS — B977 Papillomavirus as the cause of diseases classified elsewhere: Secondary | ICD-10-CM | POA: Diagnosis not present

## 2015-06-21 DIAGNOSIS — R888 Abnormal findings in other body fluids and substances: Secondary | ICD-10-CM

## 2015-06-21 DIAGNOSIS — N870 Mild cervical dysplasia: Secondary | ICD-10-CM

## 2015-06-21 DIAGNOSIS — IMO0002 Reserved for concepts with insufficient information to code with codable children: Secondary | ICD-10-CM

## 2015-06-21 NOTE — Progress Notes (Signed)
GYNECOLOGY  VISIT   HPI: 64 y.o. Married Caucasian female G1P1 with here for follow up Pap smear.  H/o abnormal pap smears that started back in 2008-2009.  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  Last LEEP 9/15 showing CIN 1 only with pathology.  Follow up Pap in 3/10 negative.  HPV testing was + for 18/45.    Denies vaginal bleeding.  She is undergoing varicose vein treatments.  Now she is undergoing sclerotherapy right now.  She is almost done with this.  GYNECOLOGIC HISTORY: Patient's last menstrual period was 07/06/2010.        OB History    Gravida Para Term Preterm AB TAB SAB Ectopic Multiple Living   1 1        1          Patient Active Problem List   Diagnosis Date Noted  . Anxiety disorder 01/04/2015  . Essential (primary) hypertension 01/04/2015  . Hypothyroidism 01/04/2015  . Pure hypercholesterolemia 01/04/2015  . Right upper quadrant pain 01/01/2015  . Vertigo, labyrinthine 11/17/2012  . Snoring 11/17/2012    Past Medical History  Diagnosis Date  . Thyroid disease     hypothyroid  . Hypertension   . H/O dizziness   . Anxiety   . Restless leg syndrome   . Snoring 11/17/2012  . History of depression     incest survivor/dysthymia  . IBS (irritable bowel syndrome)     lactose intolerance  . Fatty liver disease, nonalcoholic   . Abnormal Pap smear of cervix   . Varicose vein   . Varicose veins     Past Surgical History  Procedure Laterality Date  . 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 Colposcopy  99,00,02,04,06  . Endometrial biopsy  2/09, 1/12    09-NEG, 2012-benign proliferative endometrium  . Endovenous ablation saphenous vein w/ laser Right 04-08-2015    endovenous laser ablation (right greater saphenous vein) by Victorino Dike MD      Current Outpatient Prescriptions  Medication Sig Dispense Refill  . Calcium-Magnesium-Vitamin D (CALCIUM MAGNESIUM PO) Take 1 tablet by mouth daily.    . Cholecalciferol (VITAMIN D PO) Take by mouth daily.    .  hydrochlorothiazide (HYDRODIURIL) 25 MG tablet Take 25 mg by mouth daily.    Marland Kitchen KRILL OIL PO Take by mouth daily.    Marland Kitchen LEVOXYL 137 MCG tablet Take 137 mcg by mouth daily before breakfast.   0  . Multiple Vitamin (MULTIVITAMIN WITH MINERALS) TABS Take 1 tablet by mouth daily.    . Probiotic Product (PROBIOTIC DAILY PO)     . Turmeric 500 MG CAPS     . hydrocortisone (ANUSOL-HC) 2.5 % rectal cream Place 1 application rectally 2 (two) times daily. (Patient not taking: Reported on 06/21/2015) 30 g 0  . lidocaine (XYLOCAINE) 5 % ointment Apply 1 application topically 4 (four) times daily as needed. (Patient not taking: Reported on 06/21/2015) 1.25 g 0   No current facility-administered medications for this visit.     ALLERGIES: Sulfites; Iodine; Macrodantin; and Shellfish allergy  Family History  Problem Relation Age of Onset  . Hypertension Father   . Diabetes Father   . Dementia Father   . Cancer Father   . Hyperlipidemia Father   . Parkinson's disease Mother   . Dementia Mother   . Hypertension Mother   . Breast cancer Cousin     maternal side of family  . Dementia Brother  SH:  Non smoker  Review of Systems  All other systems reviewed and are negative.   PHYSICAL EXAMINATION:    BP 138/76 mmHg  Pulse 86  Resp 16  Ht 5' 5.5" (1.664 m)  Wt 209 lb (94.802 kg)  BMI 34.24 kg/m2  LMP 07/06/2010    General appearance: alert, cooperative and appears stated age  Pelvic: External genitalia:  no lesions              Urethra:  normal appearing urethra with no masses, tenderness or lesions              Bartholins and Skenes: normal                 Vagina: normal appearing vagina with normal color and discharge, no lesions              Cervix: no lesions and cervix is very small and there is less tissue anteriorly vs posteriorly              Bimanual Exam:  Uterus:  normal size, contour, position, consistency, mobility, non-tender  Chaperone was present for  exam.  Assessment: Recurrent abnormal Pap smears LEEP x 2, 2009 and 2015 H/O postivie 18/45 testing H/O CIN 1, 2 and 3 in the past   PLAN Pap with HR HPV pending today.  Pt and I discussed the need for hysterectomy vs laser with gyn onc in the future due to the small amount of cervix that remains.  Pt really does not want surgery so possible laser could be considered but would refer to gyn/onc for this if occurs.  ~15 minutes spent with patient >50% of time was in face to face discussion of pap history and possible future recommendations if future abnormality occurs.

## 2015-06-25 LAB — IPS PAP TEST WITH HPV

## 2015-06-26 ENCOUNTER — Encounter: Payer: Self-pay | Admitting: *Deleted

## 2015-06-27 ENCOUNTER — Other Ambulatory Visit: Payer: Self-pay | Admitting: Obstetrics & Gynecology

## 2015-06-27 ENCOUNTER — Encounter: Payer: Self-pay | Admitting: Obstetrics & Gynecology

## 2015-06-27 ENCOUNTER — Telehealth: Payer: Self-pay

## 2015-06-27 MED ORDER — HYDROCORTISONE 2.5 % RE CREA: 1.0000 "application " | TOPICAL_CREAM | Freq: Two times a day (BID) | RECTAL | Status: DC

## 2015-06-27 NOTE — Telephone Encounter (Signed)
Telephone encounter created to discuss mychart message with Dr.Miller. 

## 2015-06-27 NOTE — Telephone Encounter (Signed)
Routing to [place] for review and advise of pap smear from 06/21/2015. Patient also sent in a refill request for Hydrocortisone 2.5% rectal cream. Okay to refill at this time?

## 2015-06-27 NOTE — Telephone Encounter (Signed)
Non-Urgent Medical Question  Message [id number]   From  ORION OVERBAUGH   To  Megan Salon, MD   Sent  06/27/2015 8:32 AM     Hi  I am wondering whether my results from my pap will be back this week, or due to Christmas delays it will be next week instead?   Thank you   Jodi [name]      Responsible Party    Pool - [facility] Pool No one has taken responsibility for this message.     No actions have been taken on this message.

## 2015-06-28 NOTE — Telephone Encounter (Signed)
Dr.Miller responded to the patient via mychart regarding her medicine refill and pap smear results. Please see note. Will close encounter.

## 2015-07-03 ENCOUNTER — Ambulatory Visit (INDEPENDENT_AMBULATORY_CARE_PROVIDER_SITE_OTHER): Payer: BC Managed Care – PPO | Admitting: *Deleted

## 2015-07-03 ENCOUNTER — Encounter: Payer: Self-pay | Admitting: Vascular Surgery

## 2015-07-03 DIAGNOSIS — I83893 Varicose veins of bilateral lower extremities with other complications: Secondary | ICD-10-CM

## 2015-07-03 NOTE — Progress Notes (Signed)
X=.3% Sotradecol administered with a 27g butterfly.  Patient received a total of 12cc.  Treated anything left to treat. She has had a good result. Easy access. Will follow prn.  Photos: No.  Compression stockings applied: Yes.

## 2016-01-10 ENCOUNTER — Ambulatory Visit: Payer: BC Managed Care – PPO | Admitting: Obstetrics & Gynecology

## 2016-02-24 ENCOUNTER — Encounter: Payer: Self-pay | Admitting: Obstetrics & Gynecology

## 2016-02-24 ENCOUNTER — Ambulatory Visit: Payer: BC Managed Care – PPO | Admitting: Obstetrics & Gynecology

## 2016-02-24 ENCOUNTER — Ambulatory Visit (INDEPENDENT_AMBULATORY_CARE_PROVIDER_SITE_OTHER): Payer: Medicare Other | Admitting: Obstetrics & Gynecology

## 2016-02-24 VITALS — BP 140/80 | HR 104 | Resp 16 | Ht 65.5 in | Wt 203.2 lb

## 2016-02-24 DIAGNOSIS — Z124 Encounter for screening for malignant neoplasm of cervix: Secondary | ICD-10-CM

## 2016-02-24 DIAGNOSIS — B977 Papillomavirus as the cause of diseases classified elsewhere: Secondary | ICD-10-CM

## 2016-02-24 DIAGNOSIS — Z01419 Encounter for gynecological examination (general) (routine) without abnormal findings: Secondary | ICD-10-CM | POA: Diagnosis not present

## 2016-02-24 DIAGNOSIS — Z205 Contact with and (suspected) exposure to viral hepatitis: Secondary | ICD-10-CM

## 2016-02-24 DIAGNOSIS — Z0000 Encounter for general adult medical examination without abnormal findings: Secondary | ICD-10-CM

## 2016-02-24 DIAGNOSIS — D069 Carcinoma in situ of cervix, unspecified: Secondary | ICD-10-CM

## 2016-02-24 LAB — POCT URINALYSIS DIPSTICK
BILIRUBIN UA: NEGATIVE
Blood, UA: NEGATIVE
GLUCOSE UA: NEGATIVE
KETONES UA: NEGATIVE
LEUKOCYTES UA: NEGATIVE
Nitrite, UA: NEGATIVE
PH UA: 5
Protein, UA: NEGATIVE
Urobilinogen, UA: NEGATIVE

## 2016-02-24 NOTE — Progress Notes (Signed)
65 y.o. G1P1 MarriedCaucasianF here for annual exam.  Doing well.  Just came back from trip to [place] and the [place].  Went to several [place] parks on the trip.  Right after they got home, her husband had a stroke.  Went to the ER and he was hospitalized for three days.  All of his symptoms have resolved.  Wearing a heart monitor right now and he is on a blood thinner.  He has no residual effects from it.    Pt denies vaginal bleeding.     PCP:  Dr. Tamala Julian.  Saw her in March.  Has follow up in September.  Patient's last menstrual period was 07/06/2010.          Sexually active: No.  The current method of family planning is post menopausal status.    Exercising: Yes.    walking Smoker:  no  Health Maintenance: Pap:  06/21/2015 negative +HR HPV  History of abnormal Pap:  yes MMG:  09/11/2015 BIRADS 2 benign  Colonoscopy: declines BMD:   11/13/2011  TDaP:  2010  Pneumonia vaccine(s):  2016 Zostavax:   2015 Hep C testing: has discussed with PCP  Screening Labs: PCP, Hb today: PCP, Urine today: normal    reports that she quit smoking about 37 years ago. She has never used smokeless tobacco. She reports that she does not drink alcohol or use drugs.  Past Medical History:  Diagnosis Date  . Abnormal Pap smear of cervix   . Anxiety   . Fatty liver disease, nonalcoholic   . H/O dizziness   . History of depression    incest survivor/dysthymia  . Hypertension   . IBS (irritable bowel syndrome)    lactose intolerance  . Restless leg syndrome   . Snoring 11/17/2012  . Thyroid disease    hypothyroid  . Varicose veins     Past Surgical History:  Procedure Laterality Date  . COLPOSCOPY  99,00,02,04,06  . ENDOMETRIAL BIOPSY  2/09, 1/12   09-NEG, 2012-benign proliferative endometrium  . ENDOVENOUS ABLATION SAPHENOUS VEIN W/ LASER Right 04-08-2015   endovenous laser ablation (right greater saphenous vein) by Victorino Dike MD    . 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 Calcium-Magnesium-Vitamin D (CALCIUM MAGNESIUM PO) Take 1 tablet by mouth daily.    . Cholecalciferol (VITAMIN D PO) Take by mouth daily.    . hydrochlorothiazide (HYDRODIURIL) 25 MG tablet Take 25 mg by mouth daily.    . hydrocortisone (ANUSOL-HC) 2.5 % rectal cream Place 1 application rectally 2 (two) times daily. 30 g 0  . KRILL OIL PO Take by mouth daily.    Marland Kitchen LEVOXYL 137 MCG tablet Take 137 mcg by mouth daily before breakfast.   0  . lidocaine (XYLOCAINE) 5 % ointment Apply 1 application topically 4 (four) times daily as needed. (Patient not taking: Reported on 06/21/2015) 1.25 g 0  . Multiple Vitamin (MULTIVITAMIN WITH MINERALS) TABS Take 1 tablet by mouth daily.    . Probiotic Product (PROBIOTIC DAILY PO)     . Turmeric 500 MG CAPS      No current facility-administered medications for this visit.     Family History  Problem Relation Age of Onset  . Hypertension Father   . Diabetes Father   . Dementia Father   . Cancer Father   . Hyperlipidemia Father   . Parkinson's disease Mother   . Dementia Mother   .  Hypertension Mother   . Breast cancer Cousin     maternal side of family  . Dementia Brother     ROS:  Pertinent items are noted in HPI.  Otherwise, a comprehensive ROS was negative.  Exam:   Vitals:   02/24/16 1545  BP: 140/80  Pulse: (!) 104  Resp: 16  Weight: 203 lb 3.2 oz (92.2 kg)  Height: 5' 5.5" (1.664 m)    General appearance: alert, cooperative and appears stated age Head: Normocephalic, without obvious abnormality, atraumatic Neck: no adenopathy, supple, symmetrical, trachea midline and thyroid normal to inspection and palpation Lungs: clear to auscultation bilaterally Breasts: normal appearance, no masses or tenderness Heart: regular rate and rhythm Abdomen: soft, non-tender; bowel sounds normal; no masses,  no organomegaly Extremities: extremities normal, atraumatic, no cyanosis or edema Skin: Skin  color, texture, turgor normal. No rashes or lesions Lymph nodes: Cervical, supraclavicular, and axillary nodes normal. No abnormal inguinal nodes palpated Neurologic: Grossly normal   Pelvic: External genitalia:  no lesions              Urethra:  normal appearing urethra with no masses, tenderness or lesions              Bartholins and Skenes: normal                 Vagina: normal appearing vagina with normal color and discharge, no lesions              Cervix: no lesions              Pap taken: Yes.   Bimanual Exam:  Uterus:  normal size, contour, position, consistency, mobility, non-tender              Adnexa: normal adnexa and no mass, fullness, tenderness               Rectovaginal: Confirms               Anus:  normal sphincter tone, no lesions  Chaperone was present for exam.  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  S/P LEEP 9/15. 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  Does 3D due to density. pap smear and HR HPV obtained today Hep C antibody today Additional blood work done by Dr. Tamala Julian return annually or prn

## 2016-02-25 LAB — HEPATITIS C ANTIBODY: HCV Ab: NEGATIVE

## 2016-02-26 LAB — IPS PAP TEST WITH HPV

## 2016-03-02 ENCOUNTER — Telehealth: Payer: Self-pay | Admitting: *Deleted

## 2016-03-02 NOTE — Telephone Encounter (Signed)
Patient returned call. Notified of results. Patient verbalized understanding. 08 recall entered. AEX moved to Tuesday 03/02/2017 at 1545. Patient agreeable to date and time.   Routing to provider for final review. Patient agreeable to disposition. Will close encounter.

## 2016-03-02 NOTE — Telephone Encounter (Signed)
Message left to return call to Lahari Suttles at [phone].    

## 2016-03-02 NOTE — Telephone Encounter (Signed)
-----   Message from Megan Salon, MD sent at 02/28/2016  6:01 PM EDT ----- Please notify pt that pap and HR HPV were both neg.  Out of current recall.  Needs new recall (08) for one year.  AEX needs to be changed to one year.  Currently scheduled in November.  Please change this.  Thanks.

## 2016-03-13 ENCOUNTER — Encounter: Payer: Self-pay | Admitting: Obstetrics & Gynecology

## 2016-03-18 ENCOUNTER — Telehealth: Payer: Self-pay

## 2016-03-18 ENCOUNTER — Encounter: Payer: Self-pay | Admitting: Obstetrics & Gynecology

## 2016-03-18 NOTE — Telephone Encounter (Signed)
Spoke with patient regarding mychart encounter. Please see telephone encounter dated 03/18/2016.

## 2016-03-18 NOTE — Telephone Encounter (Signed)
Spoke with patient regarding mychart message seen below. Advised patient that her HPV testing is negative at this time, but that HPV can remain dormant in the body and that HPV testing can return positive with future pap smears. Advised of importance of having pap smears for continued follow up due to patient's history. Patient is agreeable "I know it usually lays dormant and this can occur, but I was just stretching for different results." Advised I will review with Dr.Miller as well and return call with any additional recommendations. Patient would also like to check to see if Dr.Miller is able to prescribe cologuard for her. Advised I will review will Dr.Miller and return call with further recommendations. She is agreeable.   Visit Follow-Up Question  Message [id number]  From Jodi [name] To Jodi Salon, MD Sent 03/18/2016 3:04 PM  Hi Dr Silverio Lay you are ok.  Iam resending my question in case it disappeared. If you haven't gotten around to it yet, that's fine and I don't mean to try and rush you!!  Thanks.  Jodi Salmon, MD  03/13/2016 04:00 PM  PrintPrint  Visit Follow-Up Question  Hi Dr Sabra Heck  I told Raquel Sarna to tell you the solar eclipse cured me of that hpv!! But I am wondering if the result means it is gone, cleared, or still there and hiding til a flare? Is it possible that it is not dormant but actually cured?(my acupuncturist has been busy stickin' my immune point!!).  Also, wondered if you decided if you would prescribe Cologuard for me?  I am pretty happy abt the pap results! I will see you at the upcoming lunar eclipse!!!  Thank you  Jodi [name]   Responsible Party   Pool - [facility] No one has taken responsibility for this message.  No actions have been taken on this message.

## 2016-03-20 NOTE — Telephone Encounter (Signed)
Spoke with patient. Advised patient that Dr.Miller is agreeable to complete paper work for [employer]. Dr.Miller will work on this today and this will be ready late this afternoon. Advised patient that her signature is required on the completed form. Our office is unsure of what the cost will be for the Cologaurd and this will be her financial responsibility. She is agreeable and verbalizes understanding. She will come by the office later this afternoon to sign form for faxing.

## 2016-03-20 NOTE — Telephone Encounter (Signed)
Form signed and back to you to fax.  Thank you.

## 2016-03-23 NOTE — Telephone Encounter (Signed)
Form for cologuard faxed with cover sheet and confirmation to [phone].  Routing to provider for final review. Patient agreeable to disposition. Will close encounter.

## 2016-04-03 ENCOUNTER — Ambulatory Visit: Payer: BC Managed Care – PPO | Admitting: Obstetrics & Gynecology

## 2016-04-08 LAB — COLOGUARD: COLOGUARD: NEGATIVE

## 2016-04-27 ENCOUNTER — Telehealth: Payer: Self-pay | Admitting: *Deleted

## 2016-04-27 NOTE — Telephone Encounter (Signed)
Patient notified of negative Cologuard results. Patient verbalized understanding.    Routing to provider for final review. Patient agreeable to disposition. Will close encounter.

## 2016-09-22 ENCOUNTER — Telehealth: Payer: Self-pay | Admitting: *Deleted

## 2016-09-22 NOTE — Telephone Encounter (Signed)
Call to patient. Notified of BMD results and patient verbalized understanding.   Routing to provider for final review. Patient agreeable to disposition. Will close encounter.

## 2016-10-01 ENCOUNTER — Encounter: Payer: Self-pay | Admitting: Obstetrics & Gynecology

## 2016-10-07 ENCOUNTER — Ambulatory Visit (INDEPENDENT_AMBULATORY_CARE_PROVIDER_SITE_OTHER): Payer: Medicare Other | Admitting: Obstetrics and Gynecology

## 2016-10-07 ENCOUNTER — Telehealth: Payer: Self-pay | Admitting: Obstetrics & Gynecology

## 2016-10-07 ENCOUNTER — Encounter: Payer: Self-pay | Admitting: Obstetrics and Gynecology

## 2016-10-07 VITALS — BP 132/80 | HR 104 | Resp 16 | Wt 204.0 lb

## 2016-10-07 DIAGNOSIS — N764 Abscess of vulva: Secondary | ICD-10-CM

## 2016-10-07 NOTE — Telephone Encounter (Signed)
Spoke with patient. Patient states that she noticed a "cyst" on her labia that is a little larger than a pea. Area is red and inflamed. Has become tender to the touch. Denies any fever, chills, or oozing from the site. Advised patient she will need to be seen for further evaluation. Patient is agreeable. Appointment scheduled for today 10/07/2016 at 2:30 pm with Dr.Jertson. Patient is agreeable to date, time, and to see another provider as Dr.Miller is out of the office today.  Cc: Dr.Miller  Routing to covering provider for final review. Patient agreeable to disposition. Will close encounter.

## 2016-10-07 NOTE — Telephone Encounter (Signed)
Patient has a cyst on her labia she would like check.

## 2016-10-07 NOTE — Progress Notes (Signed)
GYNECOLOGY  VISIT   HPI: 66 y.o.   Married  Caucasian  female   G1P1 with Patient's last menstrual period was 07/06/2010.   here c/o vaginal cyst on left side. She has had a small cyst on the left vulva for a long time, it seemed like it has mostly resolved. Yesterday it got bigger, slightly uncomfortable, not leaking.   GYNECOLOGIC HISTORY: Patient's last menstrual period was 07/06/2010. Contraception:postmenopause  Menopausal hormone therapy: none         OB History    Gravida Para Term Preterm AB Living   1 1       1    SAB TAB Ectopic Multiple Live Births                     Patient Active Problem List   Diagnosis Date Noted  . Anxiety disorder 01/04/2015  . Essential (primary) hypertension 01/04/2015  . Hypothyroidism 01/04/2015  . Pure hypercholesterolemia 01/04/2015  . Right upper quadrant pain 01/01/2015  . Vertigo, labyrinthine 11/17/2012  . Snoring 11/17/2012    Past Medical History:  Diagnosis Date  . Abnormal Pap smear of cervix   . Anxiety   . Fatty liver disease, nonalcoholic   . H/O dizziness   . History of depression    incest survivor/dysthymia  . Hypertension   . IBS (irritable bowel syndrome)    lactose intolerance  . Restless leg syndrome   . Snoring 11/17/2012  . Thyroid disease    hypothyroid  . Varicose veins     Past Surgical History:  Procedure Laterality Date  . COLPOSCOPY  99,00,02,04,06  . ENDOMETRIAL BIOPSY  2/09, 1/12   09-NEG, 2012-benign proliferative endometrium  . ENDOVENOUS ABLATION SAPHENOUS VEIN W/ LASER Right 04-08-2015   endovenous laser ablation (right greater saphenous vein) by Victorino Dike MD    . 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 EPINEPHrine 0.3 mg/0.3 mL IJ SOAJ injection use as directed by prescriber  0  . hydrochlorothiazide (HYDRODIURIL) 25 MG tablet Take 25 mg by mouth daily.    . hydrocortisone (ANUSOL-HC) 2.5 % rectal cream Place 1 application  rectally 2 (two) times daily. 30 g 0  . KRILL OIL PO Take by mouth daily.    Marland Kitchen levothyroxine (SYNTHROID, LEVOTHROID) 137 MCG tablet Take 137 mcg by mouth daily before breakfast.    . lidocaine (XYLOCAINE) 5 % ointment Apply 1 application topically 4 (four) times daily as needed. 1.25 g 0  . Multiple Vitamin (MULTIVITAMIN WITH MINERALS) TABS Take 1 tablet by mouth daily.     No current facility-administered medications for this visit.      ALLERGIES: Sulfites; Iodine; Macrodantin [nitrofurantoin macrocrystal]; and Shellfish allergy  Family History  Problem Relation Age of Onset  . Hypertension Father   . Diabetes Father   . Dementia Father   . Cancer Father   . Hyperlipidemia Father   . Parkinson's disease Mother   . Dementia Mother   . Hypertension Mother   . Breast cancer Cousin     maternal side of family  . Dementia Brother     Social History   Social History  . Marital status: Married    Spouse name: N/A  . Number of children: 1  . Years of education: N/A   Occupational History  . Not on file.   Social History Main Topics  . Smoking status: Former Audiological scientist  date: 09/21/1978  . Smokeless tobacco: Never Used     Comment: quit 27 years ago  . Alcohol use No  . Drug use: No  . Sexual activity: No   Other Topics Concern  . Not on file   Social History Narrative  . No narrative on file    Review of Systems  Constitutional: Negative.   HENT: Negative.   Gastrointestinal: Negative.   Genitourinary:       Vaginal cyst   Musculoskeletal: Negative.   Skin: Negative.   Neurological: Negative.   Endo/Heme/Allergies: Negative.   Psychiatric/Behavioral: Negative.     PHYSICAL EXAMINATION:    BP 132/80 (BP Location: Left Arm, Patient Position: Sitting, Cuff Size: Normal)   Pulse (!) 104   Resp 16   Wt 204 lb (92.5 kg)   LMP 07/06/2010   BMI 33.43 kg/m     General appearance: alert, cooperative and appears stated age  Pelvic: External genitalia:  On  the left labia majora is a <1 cm mildly tender abscess, no surrounding erythema               Chaperone was present for exam.  ASSESSMENT Small vulvar abscess    PLAN Warm compresses, hot soaks Needs to be seen agin if it is worsening or not improving by next week   An After Visit Summary was printed and given to the patient.  CC: Edwinna Areola, MD

## 2016-10-07 NOTE — Patient Instructions (Signed)
Skin Abscess A skin abscess is an infected area on or under your skin that contains a collection of pus and other material. An abscess may also be called a furuncle, carbuncle, or boil. An abscess can occur in or on almost any part of your body. Some abscesses break open (rupture) on their own. Most continue to get worse unless they are treated. The infection can spread deeper into the body and eventually into your blood, which can make you feel ill. Treatment usually involves draining the abscess. What are the causes? An abscess occurs when germs, often bacteria, pass through your skin and cause an infection. This may be caused by:  A scrape or cut on your skin.  A puncture wound through your skin, including a needle injection.  Blocked oil or sweat glands.  Blocked and infected hair follicles.  A cyst that forms beneath your skin (sebaceous cyst) and becomes infected. What increases the risk? This condition is more likely to develop in people who:  Have a weak body defense system (immune system).  Have diabetes.  Have dry and irritated skin.  Get frequent injections or use illegal IV drugs.  Have a foreign body in a wound, such as a splinter.  Have problems with their lymph system or veins. What are the signs or symptoms? An abscess may start as a painful, firm bump under the skin. Over time, the abscess may get larger or become softer. Pus may appear at the top of the abscess, causing pressure and pain. It may eventually break through the skin and drain. Other symptoms include:  Redness.  Warmth.  Swelling.  Tenderness.  A sore on the skin. How is this diagnosed? This condition is diagnosed based on your medical history and a physical exam. A sample of pus may be taken from the abscess to find out what is causing the infection and what antibiotics can be used to treat it. You also may have:  Blood tests to look for signs of infection or spread of an infection to your  blood.  Imaging studies such as ultrasound, CT scan, or MRI if the abscess is deep. How is this treated? Small abscesses that drain on their own may not need treatment. Treatment for an abscess that does not rupture on its own may include:  Warm compresses applied to the area several times per day.  Incision and drainage. Your health care provider will make an incision to open the abscess and will remove pus and any foreign body or dead tissue. The incision area may be packed with gauze to keep it open for a few days while it heals.  Antibiotic medicines to treat infection. For a severe abscess, you may first get antibiotics through an IV and then change to oral antibiotics. Follow these instructions at home: Abscess Care   If you have an abscess that has not drained, place a warm, clean, wet washcloth over the abscess several times a day. Do this as told by your health care provider.  Follow instructions from your health care provider about how to take care of your abscess. Make sure you:  Cover the abscess with a bandage (dressing).  Change your dressing or gauze as told by your health care provider.  Wash your hands with soap and water before you change the dressing or gauze. If soap and water are not available, use hand sanitizer.  Check your abscess every day for signs of a worsening infection. Check for:  More redness, swelling, or   pain.  More fluid or blood.  Warmth.  More pus or a bad smell. Medicines   Take over-the-counter and prescription medicines only as told by your health care provider.  If you were prescribed an antibiotic medicine, take it as told by your health care provider. Do not stop taking the antibiotic even if you start to feel better. General instructions   To avoid spreading the infection:  Do not share personal care items, towels, or hot tubs with others.  Avoid making skin contact with other people.  Keep all follow-up visits as told by your  health care provider. This is important. Contact a health care provider if:  You have more redness, swelling, or pain around your abscess.  You have more fluid or blood coming from your abscess.  Your abscess feels warm to the touch.  You have more pus or a bad smell coming from your abscess.  You have a fever.  You have muscle aches.  You have chills or a general ill feeling. Get help right away if:  You have severe pain.  You see red streaks on your skin spreading away from the abscess. This information is not intended to replace advice given to you by your health care provider. Make sure you discuss any questions you have with your health care provider. Document Released: 04/01/2005 Document Revised: 02/16/2016 Document Reviewed: 05/01/2015 Elsevier Interactive Patient Education  2017 [employer]  

## 2017-03-02 ENCOUNTER — Ambulatory Visit (INDEPENDENT_AMBULATORY_CARE_PROVIDER_SITE_OTHER): Payer: Medicare Other | Admitting: Obstetrics & Gynecology

## 2017-03-02 ENCOUNTER — Other Ambulatory Visit (HOSPITAL_COMMUNITY)
Admission: RE | Admit: 2017-03-02 | Discharge: 2017-03-02 | Disposition: A | Discharge status: Home / Self Care | Payer: Medicare Other | Source: Ambulatory Visit | Attending: Obstetrics & Gynecology | Admitting: Obstetrics & Gynecology

## 2017-03-02 ENCOUNTER — Encounter: Payer: Self-pay | Admitting: Obstetrics & Gynecology

## 2017-03-02 VITALS — BP 128/84 | HR 84 | Resp 18 | Ht 65.5 in | Wt 202.0 lb

## 2017-03-02 DIAGNOSIS — D069 Carcinoma in situ of cervix, unspecified: Secondary | ICD-10-CM | POA: Diagnosis not present

## 2017-03-02 DIAGNOSIS — Z01419 Encounter for gynecological examination (general) (routine) without abnormal findings: Secondary | ICD-10-CM | POA: Diagnosis not present

## 2017-03-02 DIAGNOSIS — Z1211 Encounter for screening for malignant neoplasm of colon: Secondary | ICD-10-CM

## 2017-03-02 DIAGNOSIS — D060 Carcinoma in situ of endocervix: Secondary | ICD-10-CM | POA: Insufficient documentation

## 2017-03-02 NOTE — Progress Notes (Signed)
66 y.o. G1P1 Married Caucasian F here for annual exam.  Doing well.  No vaginal bleeding.  Son is doing well and working at the [employer].    Patient's last menstrual period was 07/06/2010.          Sexually active: No.  The current method of family planning is post menopausal status.    Exercising: Yes.    walking, hand weights  Smoker:  Former smoker  Health Maintenance: Pap:  02/24/16 negative, HR HPV negative, 06/21/15 negative, HR HPV positive, 01/04/15 negative, 09/13/14 negative, 18/45 positive History of abnormal Pap:  yes MMG:  09/11/16 BIRADS 1 negative Colonoscopy:  Cologuard 04/08/16 negative BMD:   09/11/16 mild osteopenia  TDaP:  07/06/08 Pneumonia vaccine(s):  Done at [facility] Zostavax:   05/15/14 Hep C testing: 02/24/16 negative Screening Labs: PCP, Hb today: PCP   reports that she quit smoking about 38 years ago. She has never used smokeless tobacco. She reports that she does not drink alcohol or use drugs.  Past Medical History:  Diagnosis Date  . Abnormal Pap smear of cervix   . Anxiety   . Fatty liver disease, nonalcoholic   . H/O dizziness   . History of depression    incest survivor/dysthymia  . Hypertension   . IBS (irritable bowel syndrome)    lactose intolerance  . Restless leg syndrome   . Sleep apnea   . Snoring 11/17/2012  . Thyroid disease    hypothyroid  . Varicose veins     Past Surgical History:  Procedure Laterality Date  . COLPOSCOPY  99,00,02,04,06  . ENDOMETRIAL BIOPSY  2/09, 1/12   09-NEG, 2012-benign proliferative endometrium  . ENDOVENOUS ABLATION SAPHENOUS VEIN W/ LASER Right 04-08-2015   endovenous laser ablation (right greater saphenous vein) by Victorino Dike MD    . 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 EPINEPHrine 0.3 mg/0.3 mL IJ SOAJ injection use as directed by prescriber  0  . hydrochlorothiazide (HYDRODIURIL) 25 MG tablet Take 25 mg by mouth daily.    .  hydrocortisone (ANUSOL-HC) 2.5 % rectal cream Place 1 application rectally 2 (two) times daily. 30 g 0  . KRILL OIL PO Take by mouth daily.    Marland Kitchen levothyroxine (SYNTHROID, LEVOTHROID) 137 MCG tablet Take 137 mcg by mouth daily before breakfast.    . lidocaine (XYLOCAINE) 5 % ointment Apply 1 application topically 4 (four) times daily as needed. 1.25 g 0  . Multiple Vitamin (MULTIVITAMIN WITH MINERALS) TABS Take 1 tablet by mouth daily.     No current facility-administered medications for this visit.     Family History  Problem Relation Age of Onset  . Hypertension Father   . Diabetes Father   . Dementia Father   . Cancer Father   . Hyperlipidemia Father   . Parkinson's disease Mother   . Dementia Mother   . Hypertension Mother   . Breast cancer Cousin        maternal side of family  . Dementia Brother     ROS:  Pertinent items are noted in HPI.  Otherwise, a comprehensive ROS was negative.  Exam:   BP 128/84 (BP Location: Right Arm, Patient Position: Sitting, Cuff Size: Large)   Pulse 84   Resp 18   Ht 5' 5.5" (1.664 m)   Wt 202 lb (91.6 kg)   LMP 07/06/2010   BMI 33.10 kg/m   Weight  change: -1#  Height: 5' 5.5" (166.4 cm)  Ht Readings from Last 3 Encounters:  03/02/17 5' 5.5" (1.664 m)  02/24/16 5' 5.5" (1.664 m)  06/21/15 5' 5.5" (1.664 m)    General appearance: alert, cooperative and appears stated age Head: Normocephalic, without obvious abnormality, atraumatic Neck: no adenopathy, supple, symmetrical, trachea midline and thyroid normal to inspection and palpation Lungs: clear to auscultation bilaterally Breasts: normal appearance, no masses or tenderness Heart: regular rate and rhythm Abdomen: soft, non-tender; bowel sounds normal; no masses,  no organomegaly Extremities: extremities normal, atraumatic, no cyanosis or edema Skin: Skin color, texture, turgor normal. No rashes or lesions Lymph nodes: Cervical, supraclavicular, and axillary nodes normal. No  abnormal inguinal nodes palpated Neurologic: Grossly normal   Pelvic: External genitalia:  no lesions              Urethra:  normal appearing urethra with no masses, tenderness or lesions              Bartholins and Skenes: normal                 Vagina: normal appearing vagina with normal color and discharge, no lesions              Cervix: no lesions              Pap taken: Yes.   Bimanual Exam:  Uterus:  normal size, contour, position, consistency, mobility, non-tender              Adnexa: normal adnexa and no mass, fullness, tenderness               Rectovaginal: Confirms               Anus:  normal sphincter tone, no lesions  Chaperone was present for exam.  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 Tamala Julian in the fall Pt is going to get the Shingrix vaccination.  States does not need order for this. Has never done colonoscopy but did cologuard last year.  IFOB given today. return annually or prn

## 2017-03-04 LAB — CYTOLOGY - PAP
Diagnosis: NEGATIVE
HPV (WINDOPATH): NOT DETECTED

## 2017-03-05 DIAGNOSIS — D069 Carcinoma in situ of cervix, unspecified: Secondary | ICD-10-CM | POA: Insufficient documentation

## 2017-03-08 ENCOUNTER — Other Ambulatory Visit: Payer: Self-pay | Admitting: Obstetrics & Gynecology

## 2017-03-08 DIAGNOSIS — Z1211 Encounter for screening for malignant neoplasm of colon: Secondary | ICD-10-CM

## 2017-04-26 ENCOUNTER — Encounter: Payer: Self-pay | Admitting: Nurse Practitioner

## 2017-05-05 ENCOUNTER — Ambulatory Visit (INDEPENDENT_AMBULATORY_CARE_PROVIDER_SITE_OTHER): Payer: Medicare Other | Admitting: Nurse Practitioner

## 2017-05-05 ENCOUNTER — Encounter: Payer: Self-pay | Admitting: Nurse Practitioner

## 2017-05-05 VITALS — BP 124/70 | HR 96 | Ht 65.5 in | Wt 196.0 lb

## 2017-05-05 DIAGNOSIS — R109 Unspecified abdominal pain: Secondary | ICD-10-CM | POA: Diagnosis not present

## 2017-05-05 DIAGNOSIS — K580 Irritable bowel syndrome with diarrhea: Secondary | ICD-10-CM

## 2017-05-05 MED ORDER — DICYCLOMINE HCL 10 MG PO CAPS: 10.0000 mg | ORAL_CAPSULE | Freq: Two times a day (BID) | ORAL | 0 refills | Status: DC

## 2017-05-05 NOTE — Progress Notes (Addendum)
Chief complaint : Abdominal pain  HPI: Patient is a 66 year old female, new to this practice referred by referred by Carol Ada, M.D for abdominal pain. Patient has chronic intermittent left sided abdominal pain, and bloating since in her 20's. Pain occurs after intake of certain things like strong coffee, metamucil, whole grains. Doesn't happen immediately after ingestion but after consuming such items for several days at a time. The pain is accompanied by nausea, vomiting and diarrhea. Symptoms last for a few hours. These have been her same symptoms for years but happening more often as she is finding that more foods are becoming problematic. Also, recently put on Deplin for ADD but it irritated her LLQ. The pain starts in left waist, it is achy and burns. Having a BM helps. When not in a flare her BMs are normal. She gets accupuncture and it helps the pain. No urinary symptoms.  She has a hx of anxeity/ panic disorder which she has managed with cognitive therapy.   Cologuard last November was negative. Done at Dr. Carron Brazen office. .No blood in stool at home.   Past Medical History:  Diagnosis Date  . Abnormal Pap smear of cervix   . Anxiety   . Fatty liver disease, nonalcoholic   . H/O dizziness   . History of depression    incest survivor/dysthymia  . Hypertension   . IBS (irritable bowel syndrome)    lactose intolerance  . Restless leg syndrome   . Sleep apnea   . Snoring 11/17/2012  . Thyroid disease    hypothyroid  . Varicose veins      Past Surgical History:  Procedure Laterality Date  . COLPOSCOPY  99,00,02,04,06  . ENDOMETRIAL BIOPSY  2/09, 1/12   09-NEG, 2012-benign proliferative endometrium  . ENDOVENOUS ABLATION SAPHENOUS VEIN W/ LASER Right 04-08-2015   endovenous laser ablation (right greater saphenous vein) by Victorino Dike MD    . LEEP  3/09   CIN II/III   Family History  Problem Relation Age of Onset  . Hypertension Father   . Diabetes Father     . Dementia Father   . Cancer Father   . Hyperlipidemia Father   . Parkinson's disease Mother   . Dementia Mother   . Hypertension Mother   . Breast cancer Cousin        maternal side of family  . Dementia Brother    Social History  Substance Use Topics  . Smoking status: Former Smoker    Quit date: 09/21/1978  . Smokeless tobacco: Never Used     Comment: quit 27 years ago  . Alcohol use No   Current Outpatient Prescriptions  Medication Sig Dispense Refill  . EPINEPHrine 0.3 mg/0.3 mL IJ SOAJ injection use as directed by prescriber  0  . hydrochlorothiazide (HYDRODIURIL) 25 MG tablet Take 25 mg by mouth daily.    . hydrocortisone (ANUSOL-HC) 2.5 % rectal cream Place 1 application rectally 2 (two) times daily. 30 g 0  . KRILL OIL PO Take by mouth daily.    Marland Kitchen levothyroxine (SYNTHROID, LEVOTHROID) 137 MCG tablet Take 137 mcg by mouth daily before breakfast.    . lidocaine (XYLOCAINE) 5 % ointment Apply 1 application topically 4 (four) times daily as needed. 1.25 g 0  . Multiple Vitamin (MULTIVITAMIN WITH MINERALS) TABS Take 1 tablet by mouth daily.     No current facility-administered medications for this visit.    Allergies  Allergen Reactions  . Sulfites Anaphylaxis  .  Iodine   . Macrodantin [Nitrofurantoin Macrocrystal] Nausea Only and Other (See Comments)    Headache  . Shellfish Allergy      Review of Systems: Positive for sleeping problems All other systems reviewed and negative except where noted in HPI.    Physical Exam: BP 124/70   Pulse 96   Ht 5' 5.5" (1.664 m)   Wt 196 lb (88.9 kg)   LMP 07/06/2010   BMI 32.12 kg/m  Constitutional:  Well-developed, white female in no acute distress. Psychiatric: very pleasant, anxious appearing. Marland Kitchen EENT: Pupils normal.  Conjunctivae are normal. No scleral icterus. Neck supple.  Cardiovascular: Normal rate, regular rhythm. No edema Pulmonary/chest: Effort normal and breath sounds normal. No wheezing, rales or  rhonchi. Abdominal: Soft, nondistended. Nontender. Bowel sounds active throughout. There are no masses palpable. No hepatomegaly. Lymphadenopathy: No cervical adenopathy noted. Neurological: Alert and oriented to person place and time. Skin: Skin is warm and dry. No rashes noted.   ASSESSMENT AND PLAN:  66 year old female with chronic episodic LLQ pain associated with loose stool, nausea and occasional vomiting. Symptoms always related to diet. LLQ pain frequently present but remaining symptoms more episodic. Same symptoms since patient was in her 22's. I suspect she has IBS. Patient was told this many years ago. Negative Cologuard at GYN's office in November.  -avoid trigger foods -trial of bentyl BID.  -follow up with me in 3-4 weeks.   Tye Savoy, NP  05/05/2017, 9:16 AM  I spent 25 minutes of face-to-face time with the patient. Greater than 50% of the time was spent counseling and coordinating care. Questions answered  Cc: Carol Ada, MD  Addendum: Reviewed and agree with initial management. Pyrtle, Lajuan Lines, MD

## 2017-05-05 NOTE — Patient Instructions (Addendum)
If you are age 66 or older, your body mass index should be between 23-30. Your Body mass index is 32.12 kg/m. If this is out of the aforementioned range listed, please consider follow up with your Primary Care Provider.  If you are age 39 or younger, your body mass index should be between 19-25. Your Body mass index is 32.12 kg/m. If this is out of the aformentioned range listed, please consider follow up with your Primary Care Provider.   We have sent the following medications to your pharmacy for you to pick up at your convenience: Bentyl 10 mg  Follow up with Tye Savoy, NP on May 26, 2017 at 9:00 am.  Thank you for choosing me and [facility].   Tye Savoy, NP

## 2017-05-21 ENCOUNTER — Ambulatory Visit: Payer: Medicare Other | Admitting: Obstetrics & Gynecology

## 2017-05-26 ENCOUNTER — Encounter: Payer: Self-pay | Admitting: Nurse Practitioner

## 2017-05-26 ENCOUNTER — Other Ambulatory Visit (INDEPENDENT_AMBULATORY_CARE_PROVIDER_SITE_OTHER): Payer: Medicare Other

## 2017-05-26 ENCOUNTER — Ambulatory Visit: Payer: Medicare Other | Admitting: Nurse Practitioner

## 2017-05-26 VITALS — BP 126/70 | HR 88 | Ht 65.5 in | Wt 196.4 lb

## 2017-05-26 DIAGNOSIS — K589 Irritable bowel syndrome without diarrhea: Secondary | ICD-10-CM | POA: Diagnosis not present

## 2017-05-26 DIAGNOSIS — F419 Anxiety disorder, unspecified: Secondary | ICD-10-CM | POA: Diagnosis not present

## 2017-05-26 LAB — IGA: IgA: 218 mg/dL (ref 68–378)

## 2017-05-26 NOTE — Patient Instructions (Signed)
If you are age 66 or older, your body mass index should be between 23-30. Your Body mass index is 32.19 kg/m. If this is out of the aforementioned range listed, please consider follow up with your Primary Care Provider.  If you are age 52 or younger, your body mass index should be between 19-25. Your Body mass index is 32.19 kg/m. If this is out of the aformentioned range listed, please consider follow up with your Primary Care Provider.   Your physician has requested that you go to the basement for the following lab work before leaving today: IGA TTG  STOP Dicyclomine.  You have been given a Low Fod Map Diet.  Follow up with Dr. Hilarie Fredrickson in 2-3 months.  Will call with an appointment when schedule is available.  Thank you for choosing me and [facility].   Tye Savoy, NP

## 2017-05-26 NOTE — Progress Notes (Addendum)
     Chief Complaint:  Follow up   HPI: Patient is a 66 year old female who I saw mid October for chronic LLQ  pain, chronic nausea, vomiting, diarrhea and bloating since her 91s.  Her symptoms are strongly dependent on what she consumes but stress/anxiety also exacerbates her symptoms. The left mid and LLQ pain, as well as diarrhea are her main symptoms, not so much nausea and vomiting.  She does have a history of IBS.  She has had a negative Cologuard at [facility] office last November.  When I saw her mid October she was given a trial of dicyclomine, advised to avoid trigger foods and follow-up with me in 3-4 weeks.  She is here for follow-up  Patient has a history of anxiety, she did not tolerate Dicyclomine because it made her anxious.  She is able to tolerate rice, potatoes, salads, and ginger ale.  She has a known intolerance of greasy foods , dairy products, and whole grains.  If however she consumes some of these things, or for anxiety driven GI sx a dose of Pepto-Bismol usually helps. She has normal BMs and no abdominal pain when eating "bland" foods, unless of course she gets anxious about something     Past Medical History:  Diagnosis Date  . Abnormal Pap smear of cervix   . Anxiety   . Fatty liver disease, nonalcoholic   . H/O dizziness   . History of depression    incest survivor/dysthymia  . Hypertension   . IBS (irritable bowel syndrome)    lactose intolerance  . Restless leg syndrome   . Sleep apnea   . Snoring 11/17/2012  . Thyroid disease    hypothyroid  . Varicose veins     Patient's surgical history, family medical history, social history, medications and allergies were all reviewed in Epic    Physical Exam: BP 126/70   Pulse 88   Ht 5' 5.5" (1.664 m)   Wt 196 lb 6.4 oz (89.1 kg)   LMP 07/06/2010   BMI 32.19 kg/m   GENERAL: Well-developed white female in NAD PSYCH: :Pleasant, cooperative, normal affect EENT:  conjunctiva pink, mucous membranes moist, neck  supple without masses CARDIAC:  RRR, no murmur heard, no peripheral edema PULM: Normal respiratory effort, lungs CTA bilaterally, no wheezing ABDOMEN:  Nondistended, soft, nontender. No obvious masses, no hepatomegaly,  normal bowel sounds SKIN:  turgor, no lesions seen Musculoskeletal:  Normal muscle tone, normal strength NEURO: Alert and oriented x 3, no focal neurologic deficits    ASSESSMENT and PLAN:  1. Pleasant  66 year old female with chronic anxiety and chronic IBS sx manifested as bloating, diarrhea, LLQ pain and occasional nausea and vomiting.  Bismuth usually helps.  Years ago patient established a correlation between her symptoms and diet as well as anxiety.  -trial of low Fodmap diet -tTg, IgA -couldn't tolerate Dicyclomine, exacerbated her anxiety.  -follow up with Dr. Hilarie Fredrickson in 2-3 months. Her sx haven't changed in year but if they do or if she develops blood in stool, weight loss or other concerning features then would consider colonoscopy, see #2.   2. Colon cancer screening, Cologuard by GYN Oct 2017 was negative. It has been "years" since colonoscopy. No bowel changes or rectal bleeding.    Tye Savoy , NP 05/26/2017, 9:17 AM   Addendum: Reviewed and agree with management and plans for follow-up. Pyrtle, Lajuan Lines, MD

## 2017-05-28 LAB — TISSUE TRANSGLUTAMINASE, IGA: (TTG) AB, IGA: 1 U/mL

## 2017-05-31 ENCOUNTER — Encounter: Payer: Self-pay | Admitting: Nurse Practitioner

## 2017-06-18 ENCOUNTER — Emergency Department (HOSPITAL_COMMUNITY): Payer: Medicare Other

## 2017-06-18 ENCOUNTER — Encounter (HOSPITAL_COMMUNITY): Payer: Self-pay

## 2017-06-18 DIAGNOSIS — I10 Essential (primary) hypertension: Secondary | ICD-10-CM | POA: Diagnosis not present

## 2017-06-18 DIAGNOSIS — R079 Chest pain, unspecified: Secondary | ICD-10-CM | POA: Diagnosis present

## 2017-06-18 DIAGNOSIS — Z87891 Personal history of nicotine dependence: Secondary | ICD-10-CM | POA: Diagnosis not present

## 2017-06-18 DIAGNOSIS — Z79899 Other long term (current) drug therapy: Secondary | ICD-10-CM | POA: Insufficient documentation

## 2017-06-18 DIAGNOSIS — E039 Hypothyroidism, unspecified: Secondary | ICD-10-CM | POA: Diagnosis not present

## 2017-06-18 LAB — BASIC METABOLIC PANEL
ANION GAP: 9 (ref 5–15)
BUN: 11 mg/dL (ref 6–20)
CO2: 26 mmol/L (ref 22–32)
Calcium: 9.1 mg/dL (ref 8.9–10.3)
Chloride: 103 mmol/L (ref 101–111)
Creatinine, Ser: 0.66 mg/dL (ref 0.44–1.00)
GFR calc Af Amer: >60 mL/min (ref >60)
GLUCOSE: 159 mg/dL — AB (ref 65–99)
POTASSIUM: 3 mmol/L — AB (ref 3.5–5.1)
Sodium: 138 mmol/L (ref 135–145)

## 2017-06-18 LAB — I-STAT TROPONIN, ED: Troponin i, poc: 0 ng/mL (ref 0.00–0.08)

## 2017-06-18 LAB — CBC
HEMATOCRIT: 41.2 % (ref 36.0–46.0)
HEMOGLOBIN: 14.7 g/dL (ref 12.0–15.0)
MCH: 32.4 pg (ref 26.0–34.0)
MCHC: 35.7 g/dL (ref 30.0–36.0)
MCV: 90.7 fL (ref 78.0–100.0)
Platelets: 145 10*3/uL — ABNORMAL LOW (ref 150–400)
RBC: 4.54 MIL/uL (ref 3.87–5.11)
RDW: 13 % (ref 11.5–15.5)
WBC: 5.5 10*3/uL (ref 4.0–10.5)

## 2017-06-18 NOTE — ED Triage Notes (Signed)
Pt has hx of anxiety and can usually control it, she states this is the first time that she's had chest pain with it, she states it feels like a twinge in her chest about three times today She took a xanax earlier

## 2017-06-19 ENCOUNTER — Emergency Department (HOSPITAL_COMMUNITY)
Admission: EM | Admit: 2017-06-19 | Discharge: 2017-06-19 | Disposition: A | Discharge status: Home / Self Care | Payer: Medicare Other | Attending: Emergency Medicine | Admitting: Emergency Medicine

## 2017-06-19 DIAGNOSIS — R079 Chest pain, unspecified: Secondary | ICD-10-CM

## 2017-06-19 LAB — I-STAT TROPONIN, ED: TROPONIN I, POC: 0 ng/mL (ref 0.00–0.08)

## 2017-06-19 MED ORDER — POTASSIUM CHLORIDE CRYS ER 20 MEQ PO TBCR
40.0000 meq | EXTENDED_RELEASE_TABLET | Freq: Once | ORAL | Status: AC
  Administered 2017-06-19: 40 meq via ORAL
  Filled 2017-06-19: qty 2

## 2017-06-19 NOTE — ED Provider Notes (Signed)
[facility] DEPT Provider Note   CSN: [id number] Arrival date & time: 06/18/17  2146    History   Chief Complaint Chief Complaint  Patient presents with  . Chest Pain  . Anxiety    HPI Jodi [name] is a 66 y.o. female.  The history is provided by the patient. No language interpreter was used.  Chest Pain   This is a new problem. Episode onset: today. The problem occurs hourly. Progression since onset: waxing and waning. Associated with: unclear, but seems worse when more anxious. Pain location: central chest. The pain is mild. The pain does not radiate. Episode Length: a few seconds. Pertinent negatives include no cough, no dizziness, no fever, no lower extremity edema, no nausea, no near-syncope, no shortness of breath, no syncope and no vomiting. She has tried nothing for the symptoms. Risk factors include stress.  Her past medical history is significant for hypertension.  Pertinent negatives for past medical history include no CAD, no diabetes, no hyperlipidemia, no MI and no PE. Family history comments: Negative for ACS    Past Medical History:  Diagnosis Date  . Abnormal Pap smear of cervix   . Anxiety   . Fatty liver disease, nonalcoholic   . H/O dizziness   . History of depression    incest survivor/dysthymia  . Hypertension   . IBS (irritable bowel syndrome)    lactose intolerance  . Restless leg syndrome   . Sleep apnea   . Snoring 11/17/2012  . Thyroid disease    hypothyroid  . Varicose veins     Patient Active Problem List   Diagnosis Date Noted  . 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 Anxiety disorder 01/04/2015  . Essential (primary) hypertension 01/04/2015  . Hypothyroidism 01/04/2015  . Pure hypercholesterolemia 01/04/2015  . Right upper quadrant pain 01/01/2015  . Vertigo, labyrinthine 11/17/2012  . Snoring 11/17/2012    Past Surgical History:  Procedure  Laterality Date  . COLPOSCOPY  99,00,02,04,06  . ENDOMETRIAL BIOPSY  2/09, 1/12   09-NEG, 2012-benign proliferative endometrium  . ENDOVENOUS ABLATION SAPHENOUS VEIN W/ LASER Right 04-08-2015   endovenous laser ablation (right greater saphenous vein) by Victorino Dike MD    . LEEP  3/09   CIN II/III    OB History    Gravida Para Term Preterm AB Living   1 1       1    SAB TAB Ectopic Multiple Live Births                   Home Medications    Prior to Admission medications   Medication Sig Start Date End Date Taking? Authorizing Provider  EPINEPHrine 0.3 mg/0.3 mL IJ SOAJ injection use as directed by prescriber 02/21/16   [provider]  hydrochlorothiazide (HYDRODIURIL) 25 MG tablet Take 25 mg by mouth daily.    [provider]  hydrocortisone (ANUSOL-HC) 2.5 % rectal cream Place 1 application rectally 2 (two) times daily. 06/27/15   Megan Salon, MD  KRILL OIL PO Take by mouth daily.    [provider]  levothyroxine (SYNTHROID, LEVOTHROID) 137 MCG tablet Take 137 mcg by mouth daily before breakfast.    [provider]  lidocaine (XYLOCAINE) 5 % ointment Apply 1 application topically 4 (four) times daily as needed. 09/13/14   Megan Salon, MD  Multiple Vitamin (MULTIVITAMIN WITH MINERALS) TABS Take 1 tablet by mouth daily.    [provider]    Family History Family History  Problem Relation Age of Onset  . Hypertension Father   . Diabetes Father   . Dementia Father   . Cancer Father   . Hyperlipidemia Father   . Parkinson's disease Mother   . Dementia Mother   . Hypertension Mother   . Breast cancer Cousin        maternal side of family  . Dementia Brother     Social History Social History   Tobacco Use  . Smoking status: Former Smoker    Last attempt to quit: 09/21/1978    Years since quitting: 38.7  . Smokeless tobacco: Never Used  . Tobacco comment: quit 27 years ago  Substance Use Topics  . Alcohol use: No     Alcohol/week: 0.0 oz  . Drug use: No     Allergies   Sulfites; Iodine; Macrodantin [nitrofurantoin macrocrystal]; Shellfish allergy; and Bentyl [dicyclomine hcl]   Review of Systems Review of Systems  Constitutional: Negative for fever.  Respiratory: Negative for cough and shortness of breath.   Cardiovascular: Positive for chest pain. Negative for syncope and near-syncope.  Gastrointestinal: Negative for nausea and vomiting.  Neurological: Negative for dizziness.   Ten systems reviewed and are negative for acute change, except as noted in the HPI.    Physical Exam Updated Vital Signs BP (!) 150/80 (BP Location: Right Arm)   Pulse 78   Temp 98.3 F (36.8 C) (Oral)   Resp 11   Ht 5' 5.5" (1.664 m)   Wt 86.2 kg (190 lb)   LMP 07/06/2010   SpO2 99%   BMI 31.14 kg/m   Physical Exam  Constitutional: She is oriented to person, place, and time. She appears well-developed and well-nourished. No distress.  Nontoxic appearing.  Anxious, but pleasant.  HENT:  Head: Normocephalic and atraumatic.  Eyes: Conjunctivae and EOM are normal. No scleral icterus.  Neck: Normal range of motion.  Cardiovascular: Normal rate, regular rhythm and intact distal pulses.  Pulmonary/Chest: Effort normal. No stridor. No respiratory distress. She has no wheezes. She has no rales.  Lungs clear to auscultation bilaterally.  No reproducible chest wall tenderness.  No crepitus or deformity.  Abdominal: Soft. She exhibits no distension and no mass. There is no tenderness.  Soft, nontender, nondistended abdomen.  Negative Murphy sign.  Musculoskeletal: Normal range of motion.  Neurological: She is alert and oriented to person, place, and time. She exhibits normal muscle tone. Coordination normal.  GCS 15. Patient moving all extremities.  Skin: Skin is warm and dry. No rash noted. She is not diaphoretic. No erythema. No pallor.  Psychiatric: Her behavior is normal.  Nursing note and vitals  reviewed.    ED Treatments / Results  Labs (all labs ordered are listed, but only abnormal results are displayed) Labs Reviewed  BASIC METABOLIC PANEL - Abnormal; Notable for the following components:      Result Value   Potassium 3.0 (*)    Glucose, Bld 159 (*)    All other components within normal limits  CBC - Abnormal; Notable for the following components:   Platelets 145 (*)    All other components within normal limits  I-STAT TROPONIN, ED  I-STAT TROPONIN, ED    EKG  EKG Interpretation  Date/Time:  Saturday June 19 2017 00:25:32 EST Ventricular Rate:  76 PR Interval:    QRS Duration: 97 QT Interval:  372 QTC Calculation: 419 R Axis:   68 Text Interpretation:  Sinus  rhythm No acute changes No significant change since last tracing Confirmed by Varney Biles [phone]) on 06/21/2017 1:44:36 AM       Radiology No results found.  Procedures Procedures (including critical care time)  Medications Ordered in ED Medications  potassium chloride SA (K-DUR,KLOR-CON) CR tablet 40 mEq (40 mEq Oral Given 06/19/17 0116)     Initial Impression / Assessment and Plan / ED Course  I have reviewed the triage vital signs and the nursing notes.  Pertinent labs & imaging results that were available during my care of the patient were reviewed by me and considered in my medical decision making (see chart for details).     Patient presenting to the ED for intermittent, waxing and waning chest pain with onset today. Chest pain is not likely of cardiac or pulmonary etiology. Cardiac work up is reassuring and symptoms are atypical for ACS. Suspect pain to be 2/2 worsening anxiety. Further doubt PE. Well's PE score low, c/w unlikely cause of pain today. No mediastinal widening on CXR to suggest dissection. No PTX, PNA, pleural effusion.   Patient advised to follow up with her PCP regarding her visit to the ED today. I do not believe further emergent work up is indicated at this  time. Return precautions discussed and provided. Patient discharged in stable condition with no unaddressed concerns.   Final Clinical Impressions(s) / ED Diagnoses   Final diagnoses:  Chest pain, unspecified type    ED Discharge Orders    None       Antonietta Breach, PA-C 06/23/17 1956    Jola Schmidt, MD 06/24/17 [id number]

## 2017-06-19 NOTE — Discharge Instructions (Signed)
Your work up today was reassuring and did not reveal a concerning cause of your chest pain. We recommend that you remain well hydrated by drinking plenty of fluids. You may take tylenol as needed for pain. Follow up with your primary care doctor regarding your visit to the ED today, to ensure resolution of symptoms.

## 2017-07-19 HISTORY — PX: CHOLECYSTECTOMY: SHX55

## 2017-07-20 ENCOUNTER — Other Ambulatory Visit: Payer: Self-pay | Admitting: General Surgery

## 2017-09-06 ENCOUNTER — Encounter: Payer: Self-pay | Admitting: Internal Medicine

## 2017-09-06 ENCOUNTER — Ambulatory Visit: Payer: Medicare Other | Admitting: Internal Medicine

## 2017-09-06 VITALS — BP 146/68 | HR 93 | Ht 65.5 in | Wt 177.0 lb

## 2017-09-06 DIAGNOSIS — R109 Unspecified abdominal pain: Secondary | ICD-10-CM | POA: Diagnosis not present

## 2017-09-06 DIAGNOSIS — K589 Irritable bowel syndrome without diarrhea: Secondary | ICD-10-CM

## 2017-09-06 MED ORDER — HYOSCYAMINE SULFATE 0.125 MG SL SUBL: 0.1250 mg | SUBLINGUAL_TABLET | Freq: Three times a day (TID) | SUBLINGUAL | 3 refills | Status: DC | PRN

## 2017-09-06 NOTE — Patient Instructions (Addendum)
Please follow up with Dr Hilarie Fredrickson in 3-4 months.  Continue lactaid as needed.  Continue pepto bismol as needed.  We have sent the following medications to your pharmacy for you to pick up at your convenience: Levsin three times daily as needed for left sided abdominal pain  You will be due for a recall cologuard vs. colonoscopy in 04/2019. We will send you a reminder in the mail when it gets closer to that time.  If you are age 67 or older, your body mass index should be between 23-30. Your Body mass index is 29.01 kg/m. If this is out of the aforementioned range listed, please consider follow up with your Primary Care Provider.  If you are age 88 or younger, your body mass index should be between 19-25. Your Body mass index is 29.01 kg/m. If this is out of the aformentioned range listed, please consider follow up with your Primary Care Provider.

## 2017-09-06 NOTE — Progress Notes (Signed)
Subjective:    Patient ID: Jodi [name], female    DOB: 01/06/1951, 67 y.o.   MRN: [id number]  HPI Jodi [name] is a 67 year old female with a past medical history of chronic left-sided abdominal pain, gallbladder disease status post cholecystectomy in January 2019 who is here for follow-up.  She also has a history of anxiety, hypothyroidism, sleep apnea.  She was last seen in November 2018 by Tye Savoy NP.  At the time of her last visit it was felt that her left-sided abdominal pain which is episodic and when severe can be associated with diarrhea, nausea and vomiting was felt secondary to irritable bowel syndrome.  TTG was negative.  Dicyclomine was tried but this seemed to exacerbate her anxiety.  She was given a copy of a 5 map diet but never tried this because she wound up having cholecystectomy in January 2019 with Dr. Donne Hazel.  Prior to surgery she was also having issues with nausea and upper abdominal discomfort as well.  She reports cholecystectomy went well and since this time she has had no nausea and an improved appetite.  Pathology was reviewed today and showed chronic cholecystitis with gallstones.  It also seems to have improved her intermittent left-sided abdominal pain.  Regarding this left-sided abdominal pain she states that she this began in her early 65s and so has been present for over 40 years.  It is intermittent and can be associated with a sharp crampy-like abdominal discomfort which does not truly radiate.  It can be associated with urgent loose stools and when most severe nausea and vomiting.  Between these episodes she reports regular daily bowel movements occasionally every other day.  She denies blood in his stool or melena.  She has lost weight since her abdominal surgery but feels hungry again and wants to eat more now (as compared to prior to cholecystectomy).  Her abdominal pain seems to be triggered by lactose, Motrin, Metamucil and coffee.  It is  definitively better with Pepto-Bismol and a bland diet.  Since surgery she is only had one minor episode of her abdominal pain.  Recently she has had some vertigo and associated nausea.  This is not a new problem for her.  She will occasionally use Xanax for flares of anxiety.  She had a negative Cologuard in October 2017.  She has never had a colonoscopy and denies a family history of colon cancer.  Review of Systems As per HPI, otherwise negative  Current Medications, Allergies, Past Medical History, Past Surgical History, Family History and Social History were reviewed in [employer] record.     Objective:   Physical Exam BP (!) 146/68   Pulse 93   Ht 5' 5.5" (1.664 m)   Wt 177 lb (80.3 kg)   LMP 07/06/2010   BMI 29.01 kg/m  Constitutional: Well-developed and well-nourished. No distress. HEENT: Normocephalic and atraumatic.  Conjunctivae are normal.  No scleral icterus. Neck: Neck supple. Trachea midline. Cardiovascular: Normal rate, regular rhythm and intact distal pulses. No M/R/G Pulmonary/chest: Effort normal and breath sounds normal. No wheezing, rales or rhonchi. Abdominal: Soft, nontender, nondistended. Bowel sounds active throughout. There are no masses palpable. No hepatosplenomegaly. Extremities: no clubbing, cyanosis, or edema Neurological: Alert and oriented to person place and time. Skin: Skin is warm and dry. Psychiatric: Normal mood and affect. Behavior is normal.  CBC    Component Value Date/Time   WBC 5.5 06/18/2017 2214   RBC 4.54 06/18/2017 2214   HGB  14.7 06/18/2017 2214   HGB 14.2 12/21/2013 1155   HCT 41.2 06/18/2017 2214   PLT 145 (L) 06/18/2017 2214   MCV 90.7 06/18/2017 2214   MCH 32.4 06/18/2017 2214   MCHC 35.7 06/18/2017 2214   RDW 13.0 06/18/2017 2214   LYMPHSABS 1.2 09/20/2012 0855   MONOABS 0.3 09/20/2012 0855   EOSABS 0.0 09/20/2012 0855   BASOSABS 0.0 09/20/2012 0855   CMP     Component Value Date/Time    NA 138 06/18/2017 2214   K 3.0 (L) 06/18/2017 2214   CL 103 06/18/2017 2214   CO2 26 06/18/2017 2214   GLUCOSE 159 (H) 06/18/2017 2214   BUN 11 06/18/2017 2214   CREATININE 0.66 06/18/2017 2214   CALCIUM 9.1 06/18/2017 2214   GFRNONAA >60 06/18/2017 2214   GFRAA >60 06/18/2017 2214      Assessment & Plan:   67 year old female with a past medical history of chronic left-sided abdominal pain, gallbladder disease status post cholecystectomy in January 2019 who is here for follow-up.  1.  Chronic left-sided abdominal pain/associated with nausea, vomiting and loose stools --episodes which have been more infrequent since cholecystectomy though I cannot associate these episodes with her gallstones and gallbladder disease.  Given these symptoms intermittently over 40 years, negative Cologuard 17 months ago and no other alarm symptoms this is most likely irritable bowel related.  Benefits from Pepto-Bismol and the use of Lactaid when eating meals containing lactose.  She can continue both Pepto-Bismol and Lactaid with her meals.  She had some mild increase in anxiety with Bentyl but I will try her on Levsin 0.125 mg every 4-6 hours as needed for left-sided abdominal discomfort.  She is asked to notify me if this worsens her anxiety.  She could use Xanax which she uses on occasion for anxiety if needed.  I did let her know that if Levsin is helping but definitively worsening anxiety to let me know.  2.  Gallbladder disease --now status post cholecystectomy and improving  3.  Colon cancer screening --negative Cologuard in October 2017, repeat in October 2020 or colonoscopy in October 2020  3-4 month follow-up, sooner if needed  25 minutes spent with the patient today. Greater than 50% was spent in counseling and coordination of care with the patient

## 2017-09-14 ENCOUNTER — Encounter: Payer: Self-pay | Admitting: Physical Therapy

## 2017-09-14 ENCOUNTER — Other Ambulatory Visit: Payer: Self-pay

## 2017-09-14 ENCOUNTER — Ambulatory Visit: Payer: Medicare Other | Attending: Family Medicine | Admitting: Physical Therapy

## 2017-09-14 DIAGNOSIS — R2681 Unsteadiness on feet: Secondary | ICD-10-CM | POA: Diagnosis present

## 2017-09-14 DIAGNOSIS — R2689 Other abnormalities of gait and mobility: Secondary | ICD-10-CM | POA: Insufficient documentation

## 2017-09-14 DIAGNOSIS — R42 Dizziness and giddiness: Secondary | ICD-10-CM

## 2017-09-14 NOTE — Patient Instructions (Signed)
Gaze Stabilization: Standing Feet Apart    Feet shoulder width apart, keeping eyes on target on wall _6___ feet away, tilt head down 15-30 and move head side to side for __60__ seconds. Repeat while moving head up and down for _60__ seconds. Do _3-5__ sessions per day.  PLAIN BACKGROUND FOR NOW!!   Gaze Stabilization: Tip Card   1.Target must remain in focus, not blurry, and appear stationary while head is in motion. 2.Perform exercises with small head movements (45 to either side of midline). 3.Increase speed of head motion so long as target is in focus. 4.If you wear eyeglasses, be sure you can see target through lens (therapist will give specific instructions for bifocal / progressive lenses). 5.These exercises may provoke dizziness or nausea. Work through these symptoms. If too dizzy, slow head movement slightly. Rest between each exercise. 6.Exercises demand concentration; avoid distractions. 7.For safety, perform standing exercises close to a counter, wall, corner, or next to something   Repeat using target on pattern background.  Copyright  VHI. All rights reserved.

## 2017-09-15 ENCOUNTER — Other Ambulatory Visit: Payer: Self-pay
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
## 2017-09-20 ENCOUNTER — Encounter: Payer: Self-pay | Admitting: Physical Therapy

## 2017-09-20 ENCOUNTER — Ambulatory Visit: Payer: Medicare Other | Admitting: Physical Therapy

## 2017-09-20 DIAGNOSIS — R42 Dizziness and giddiness: Secondary | ICD-10-CM

## 2017-09-20 DIAGNOSIS — R2681 Unsteadiness on feet: Secondary | ICD-10-CM

## 2017-09-21 NOTE — Therapy (Addendum)
[facility] [address] [place], [place], [address] Phone: [phone]   Fax:  [phone]  Physical Therapy Treatment  Patient Details  Name: Jodi [name] MRN: [id number] Date of Birth: Jun 22, 1951 Referring Provider: Carol Ada, MD   Encounter Date: 09/20/2017  PT End of Session - 09/21/17 2132    Visit Number  2    Number of Visits  8    Date for PT Re-Evaluation  10/14/17    Authorization Type  [employer]HC Medicare    Authorization Time Period  09-14-17 - 11-13-17    PT Start Time  0845    PT Stop Time  0932    PT Time Calculation (min)  47 min       Past Medical History:  Diagnosis Date  . Abnormal Pap smear of cervix   . Anxiety   . Fatty liver disease, nonalcoholic   . H/O dizziness   . History of depression    incest survivor/dysthymia  . Hypertension   . IBS (irritable bowel syndrome)    lactose intolerance  . Restless leg syndrome   . Sleep apnea   . Snoring 11/17/2012  . Thyroid disease    hypothyroid  . Varicose veins     Past Surgical History:  Procedure Laterality Date  . CHOLECYSTECTOMY    . COLPOSCOPY  99,00,02,04,06  . ENDOMETRIAL BIOPSY  2/09, 1/12   09-NEG, 2012-benign proliferative endometrium  . ENDOVENOUS ABLATION SAPHENOUS VEIN W/ LASER Right 04-08-2015   endovenous laser ablation (right greater saphenous vein) by Victorino Dike MD    . LEEP  3/09   CIN II/III    There were no vitals filed for this visit.  Subjective Assessment - 09/21/17 2131    Subjective  Pt states she did alot of bending over working in garden yesterday - felt more off balance with walking after working in garden; states she feels more off balance than dizzy at this time (at start of PT session)    Pertinent History  sleep apnea, h/o vertigo (labyrinthine per chart note), HTN, depression/anxiety    Patient Stated Goals  resolve the vertigo    Currently in Pain?  No/denies        Sensory Organization Test -  composite score 66/100 with N= 68/100 Somatosensory WNL's Visual input decreased at 73/100 with N = 80/100 Vestibular input WNL's  Condition 1 - trials 1 & 2 WNL's with #3 slightly decreased Condition 2 - trial 1 slightly decr.; 2 and 3 WNL's Condition 3 - all 3 trials WNL's Condition 4 - all 3 trials decr.  Condition 5 - trials 1 and 2 decr:  #3 WNL's Condition 6 - all 3 trials slightly decreased  Pt instructed in balance on foam exercises for HEP - feet apart/together with EO and then with EC Head turns horizontally and vertically 8-10 reps each                         PT Long Term Goals - 09/20/17 0850      PT LONG TERM GOAL #1   Title  Pt will report at least 50% improvement in vertigo.      PT LONG TERM GOAL #2   Title  Pt will improve DHI score by at least 20 points to indicate improvement in status/vertigo.    Baseline  38% on 09-20-17              Patient will benefit  from skilled therapeutic intervention in order to improve the following deficits and impairments:     Visit Diagnosis: Unsteadiness on feet  Dizziness and giddiness     Problem List Patient Active Problem List   Diagnosis Date Noted  . 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 Anxiety disorder 01/04/2015  . Essential (primary) hypertension 01/04/2015  . Hypothyroidism 01/04/2015  . Pure hypercholesterolemia 01/04/2015  . Right upper quadrant pain 01/01/2015  . Vertigo, labyrinthine 11/17/2012  . Snoring 11/17/2012    Alda Lea, PT 09/21/2017, 9:33 PM  [facility] [address] [place], [place], [address] Phone: [phone]   Fax:  [phone]  Name: Jodi [name] MRN: [id number] Date of Birth: Jan 19, 1951

## 2017-09-22 NOTE — Patient Instructions (Addendum)
Feet Together (Compliant Surface) Varied Arm Positions - Eyes Open    With eyes open, standing on compliant surface: __pillow______, feet together and arms out, look at a stationary object. Hold _60___ seconds. Repeat __2__ times per session. Do _1___ sessions per day.  Copyright  VHI. All rights reserved.  Feet Apart (Compliant Surface) Head Motion - Eyes Closed    Stand on compliant surface: __pillows______ with feet shoulder width apart. Close eyes and move head slowly, up and down. Repeat __1__ times per session. Do __1__ sessions per day.  Marland Kitchen

## 2017-09-27 ENCOUNTER — Ambulatory Visit: Payer: Medicare Other | Admitting: Physical Therapy

## 2017-09-27 DIAGNOSIS — R42 Dizziness and giddiness: Secondary | ICD-10-CM

## 2017-09-27 DIAGNOSIS — R2681 Unsteadiness on feet: Secondary | ICD-10-CM

## 2017-09-28 ENCOUNTER — Encounter: Payer: Self-pay | Admitting: Physical Therapy

## 2017-09-28 ENCOUNTER — Ambulatory Visit: Payer: Medicare Other | Admitting: Physical Therapy

## 2017-09-28 NOTE — Therapy (Signed)
[facility] [address] [place], [place], [address] Phone: [phone]   Fax:  [phone]  Physical Therapy Treatment  Patient Details  Name: Jodi [name] MRN: [id number] Date of Birth: 04-29-1951 Referring Provider: Carol Ada, MD   Encounter Date: 09/27/2017  PT End of Session - 09/28/17 2057    Visit Number  3    Number of Visits  8    Date for PT Re-Evaluation  10/14/17    Authorization Type  [employer] Medicare    Authorization Time Period  09-14-17 - 11-13-17    PT Start Time  0851    PT Stop Time  0934    PT Time Calculation (min)  43 min       Past Medical History:  Diagnosis Date  . Abnormal Pap smear of cervix   . Anxiety   . Fatty liver disease, nonalcoholic   . H/O dizziness   . History of depression    incest survivor/dysthymia  . Hypertension   . IBS (irritable bowel syndrome)    lactose intolerance  . Restless leg syndrome   . Sleep apnea   . Snoring 11/17/2012  . Thyroid disease    hypothyroid  . Varicose veins     Past Surgical History:  Procedure Laterality Date  . CHOLECYSTECTOMY    . COLPOSCOPY  99,00,02,04,06  . ENDOMETRIAL BIOPSY  2/09, 1/12   09-NEG, 2012-benign proliferative endometrium  . ENDOVENOUS ABLATION SAPHENOUS VEIN W/ LASER Right 04-08-2015   endovenous laser ablation (right greater saphenous vein) by Victorino Dike MD    . LEEP  3/09   CIN II/III    There were no vitals filed for this visit.  Subjective Assessment - 09/28/17 2049    Subjective  Pt states she has tried wearing glasses at times and states they do seem to help; feels she is doing better overall    Pertinent History  sleep apnea, h/o vertigo (labyrinthine per chart note), HTN, depression/anxiety    Patient Stated Goals  resolve the vertigo    Currently in Pain?  No/denies                       [facility] Adult PT Treatment/Exercise - 09/28/17 0001      High Level Balance   High Level  Balance Activities  Sudden stops          Balance Exercises - 09/28/17 2051      Balance Exercises: Standing   Standing Eyes Opened  Narrow base of support (BOS);Wide (BOA);Head turns;Foam/compliant surface;5 reps    Standing Eyes Closed  Narrow base of support (BOS);Wide (BOA);Head turns;Foam/compliant surface;5 reps    Rockerboard  Anterior/posterior;Head turns;EO;EC;10 reps;Intermittent UE support    Gait with Head Turns  Forward;Retro;2 reps    Partial Tandem Stance  Eyes open;Intermittent upper extremity support;Foam/compliant surface;2 reps;15 secs    Other Standing Exercises  Pt performed standing on Bosu - squats x 5 reps with EO and then with EC with UE support :  weight shifts side to side and laterally 5 reps with UE support     Overall Comments  Intermittent upper extremity support;5 reps      Reviewed balance on foam exercises - feet apart and then feet together - with head turns - EO and EC Trunk rotation in corner - straight across and then diagonal pattterns - with EO and then with EC 5 reps each       PT Long  Term Goals - 09/20/17 0850      PT LONG TERM GOAL #1   Title  Pt will report at least 50% improvement in vertigo.      PT LONG TERM GOAL #2   Title  Pt will improve DHI score by at least 20 points to indicate improvement in status/vertigo.    Baseline  38% on 09-20-17            Plan - 09/28/17 2057    Clinical Impression Statement  Pt demonstrates decr. standing balance on compliant surfaces with EC compared to balance with EO on compliant surfaces:  pt did report improvement in steadiness with use of prism glasses    Rehab Potential  Good    Clinical Impairments Affecting Rehab Potential  pt has recently obtained prism eyeglasses prescribed by her ophtalmologist    PT Frequency  1x / week    PT Duration  4 weeks    PT Treatment/Interventions  ADLs/Self Care Home Management;Canalith Repostioning;Gait training;Therapeutic activities;Therapeutic  exercise;Balance training;Neuromuscular re-education;Patient/family education;Vestibular    PT Next Visit Plan  cont vestibular exercises    PT Home Exercise Plan  x1 viewing was given on 09-14-17    Consulted and Agree with Plan of Care  Patient       Patient will benefit from skilled therapeutic intervention in order to improve the following deficits and impairments:  Decreased balance, Difficulty walking, Dizziness  Visit Diagnosis: Unsteadiness on feet  Dizziness and giddiness     Problem List Patient Active Problem List   Diagnosis Date Noted  . 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 Anxiety disorder 01/04/2015  . Essential (primary) hypertension 01/04/2015  . Hypothyroidism 01/04/2015  . Pure hypercholesterolemia 01/04/2015  . Right upper quadrant pain 01/01/2015  . Vertigo, labyrinthine 11/17/2012  . Snoring 11/17/2012    Alda Lea, PT 09/28/2017, 9:06 PM  [facility] [address] [place], [place], [address] Phone: [phone]   Fax:  [phone]  Name: Jodi [name] MRN: [id number] Date of Birth: 24-Mar-1951

## 2017-10-07 ENCOUNTER — Ambulatory Visit: Payer: Medicare Other | Attending: Family Medicine | Admitting: Physical Therapy

## 2017-10-07 DIAGNOSIS — R2681 Unsteadiness on feet: Secondary | ICD-10-CM | POA: Diagnosis present

## 2017-10-07 DIAGNOSIS — R42 Dizziness and giddiness: Secondary | ICD-10-CM

## 2017-10-08 ENCOUNTER — Encounter: Payer: Self-pay | Admitting: Physical Therapy

## 2017-10-08 NOTE — Therapy (Signed)
[facility] [address] [place], [place], [address] Phone: [phone]   Fax:  [phone]  Physical Therapy Treatment  Patient Details  Name: Jodi [name] MRN: [id number] Date of Birth: [birth year] Referring Provider: Carol Ada, MD   Encounter Date: 10/07/2017  PT End of Session - 10/08/17 1356    Visit Number  4    Number of Visits  8    Date for PT Re-Evaluation  10/14/17    Authorization Type  [employer] Medicare    Authorization Time Period  09-14-17 - 11-13-17    PT Start Time  0848    PT Stop Time  0933    PT Time Calculation (min)  45 min    Equipment Utilized During Treatment  Gait belt       Past Medical History:  Diagnosis Date  . Abnormal Pap smear of cervix   . Anxiety   . Fatty liver disease, nonalcoholic   . H/O dizziness   . History of depression    incest survivor/dysthymia  . Hypertension   . IBS (irritable bowel syndrome)    lactose intolerance  . Restless leg syndrome   . Sleep apnea   . Snoring 11/17/2012  . Thyroid disease    hypothyroid  . Varicose veins     Past Surgical History:  Procedure Laterality Date  . CHOLECYSTECTOMY    . COLPOSCOPY  99,00,02,04,06  . ENDOMETRIAL BIOPSY  2/09, 1/12   09-NEG, 2012-benign proliferative endometrium  . ENDOVENOUS ABLATION SAPHENOUS VEIN W/ LASER Right 04-08-2015   endovenous laser ablation (right greater saphenous vein) by Victorino Dike MD    . LEEP  3/09   CIN II/III    There were no vitals filed for this visit.  Subjective Assessment - 10/08/17 1352    Subjective  Pt states she was diagnosed with glaucoma on Tuesday - wonders if stress can cause some dizziness; says she was a little off yesterday and feels a little nauseous today    Pertinent History  sleep apnea, h/o vertigo (labyrinthine per chart note), HTN, depression/anxiety    Patient Stated Goals  resolve the vertigo    Currently in Pain?  No/denies                        [facility] Adult PT Treatment/Exercise - 10/08/17 0001      Ambulation/Gait   Ambulation/Gait  Yes    Ambulation Distance (Feet)  200 Feet    Assistive device  None    Gait Pattern  Within Functional Limits    Gait Comments  Pt amb. making circles clockwise and counterclockwise with ball 40' x 2 reps; "V" and "t" pattern 66' x1 rep each for improved gaze stabilization     Patterned, colorful ball used for above activity for increased visual stimulation     Balance Exercises - 10/08/17 1354      Balance Exercises: Standing   Standing Eyes Opened  Narrow base of support (BOS);Wide (BOA);Head turns;Foam/compliant surface;5 reps    Standing Eyes Closed  Narrow base of support (BOS);Wide (BOA);Head turns;Foam/compliant surface;5 reps    Tandem Stance  Eyes open;2 reps;Foam/compliant surface;Eyes closed partial tandem stance on blue mat     Rockerboard  Anterior/posterior;Head turns;EO;EC;10 reps;Intermittent UE support    Gait with Head Turns  Forward;2 reps 30' - horizontal      Pt performed balance activity - standing on pillows in corner - touching each wall straight across 5  reps each with EO, then each Diagonal pattern with EO then all 3 ways with EC with CGA  Pt performed marching forward and backward on mat on floor - with EO with horizontal and vertical head turns   Pt performed marching on mat on incline - with EO - with no head movement, adding horizontal and vertical head turns x 5 reps each; then with  EC Marching on decline on mat - with EO - with head stationary - progressing to horizontal and vertical head turns; progressing to marching with EC  Pt performed crossovers on blue mat - each leg - then 180 degree turn- 5 reps with CGA     PT Long Term Goals - 09/20/17 0850      PT LONG TERM GOAL #1   Title  Pt will report at least 50% improvement in vertigo.      PT LONG TERM GOAL #2   Title  Pt will improve DHI score by at least 20  points to indicate improvement in status/vertigo.    Baseline  38% on 09-20-17            Plan - 10/08/17 1357    Clinical Impression Statement  Pt progressing towards goals with improvements in balance on compliant surfaces noted.  Pt did not wear glasses during today's rx session - states prism glasses seem to help some days, but not helpful on some days as well.      Rehab Potential  Good    Clinical Impairments Affecting Rehab Potential  pt has recently obtained prism eyeglasses prescribed by her ophtalmologist    PT Frequency  1x / week    PT Duration  4 weeks    PT Treatment/Interventions  ADLs/Self Care Home Management;Canalith Repostioning;Gait training;Therapeutic activities;Therapeutic exercise;Balance training;Neuromuscular re-education;Patient/family education;Vestibular    PT Next Visit Plan  check goals - renew for 4 additional visits    PT Home Exercise Plan  x1 viewing was given on 09-14-17    Consulted and Agree with Plan of Care  Patient       Patient will benefit from skilled therapeutic intervention in order to improve the following deficits and impairments:  Decreased balance, Difficulty walking, Dizziness  Visit Diagnosis: Dizziness and giddiness  Unsteadiness on feet     Problem List Patient Active Problem List   Diagnosis Date Noted  . 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 Anxiety disorder 01/04/2015  . Essential (primary) hypertension 01/04/2015  . Hypothyroidism 01/04/2015  . Pure hypercholesterolemia 01/04/2015  . Right upper quadrant pain 01/01/2015  . Vertigo, labyrinthine 11/17/2012  . Snoring 11/17/2012    DildayJenness Corner, PT 10/08/2017, 2:01 PM  [facility] [address] [place], [place], [address] Phone: [phone]   Fax:  [phone]  Name: Jodi [name] MRN: [id number] Date of Birth: [birth year]

## 2017-10-14 ENCOUNTER — Ambulatory Visit: Payer: Medicare Other | Admitting: Physical Therapy

## 2017-10-14 DIAGNOSIS — R42 Dizziness and giddiness: Secondary | ICD-10-CM | POA: Diagnosis not present

## 2017-10-14 DIAGNOSIS — R2681 Unsteadiness on feet: Secondary | ICD-10-CM

## 2017-10-15 ENCOUNTER — Encounter: Payer: Self-pay | Admitting: Physical Therapy

## 2017-10-15 NOTE — Therapy (Signed)
[facility] [address] [place], [place], [address] Phone: [phone]   Fax:  [phone]  Physical Therapy Treatment  Patient Details  Name: Jodi [name] MRN: [id number] Date of Birth: 1950/12/02 Referring Provider: Carol Ada, MD   Encounter Date: 10/14/2017  PT End of Session - 10/15/17 1555    Visit Number  5    Number of Visits  8    Date for PT Re-Evaluation  11/13/17    Authorization Type  [employer] Medicare    Authorization Time Period  09-14-17 - 11-13-17; 10-14-17 -12-13-17    PT Start Time  0849    PT Stop Time  0935    PT Time Calculation (min)  46 min    Equipment Utilized During Treatment  Gait belt       Past Medical History:  Diagnosis Date  . Abnormal Pap smear of cervix   . Anxiety   . Fatty liver disease, nonalcoholic   . H/O dizziness   . History of depression    incest survivor/dysthymia  . Hypertension   . IBS (irritable bowel syndrome)    lactose intolerance  . Restless leg syndrome   . Sleep apnea   . Snoring 11/17/2012  . Thyroid disease    hypothyroid  . Varicose veins     Past Surgical History:  Procedure Laterality Date  . CHOLECYSTECTOMY    . COLPOSCOPY  99,00,02,04,06  . ENDOMETRIAL BIOPSY  2/09, 1/12   09-NEG, 2012-benign proliferative endometrium  . ENDOVENOUS ABLATION SAPHENOUS VEIN W/ LASER Right 04-08-2015   endovenous laser ablation (right greater saphenous vein) by Victorino Dike MD    . LEEP  3/09   CIN II/III    There were no vitals filed for this visit.  Subjective Assessment - 10/15/17 1547    Subjective  Pt states she is better - went to [place] concert and thought she would feel bad when she got out of there due to the noise and loud music but states she thinks it actually helped her to feel better    Pertinent History  sleep apnea, h/o vertigo (labyrinthine per chart note), HTN, depression/anxiety    Patient Stated Goals  resolve the vertigo    Currently in  Pain?  No/denies                       [facility] Adult PT Treatment/Exercise - 10/15/17 0001      Ambulation/Gait   Ambulation/Gait  Yes    Ambulation Distance (Feet)  100 Feet    Assistive device  None    Gait Pattern  Within Functional Limits    Ambulation Surface  Level;Indoor    Gait Comments  Pt amb. making circles clockwise and counterclockwise with ball 40' x 2 reps; "V" and "t" pattern 102' x1 rep each for improved gaze stabilization      High Level Balance   High Level Balance Activities  Tandem walking;Marching forwards;Marching backwards;Negotiating over obstacles standing on blue compliant mat          Balance Exercises - 10/15/17 1553      Balance Exercises: Standing   Standing Eyes Opened  Narrow base of support (BOS);Wide (BOA);Head turns;Foam/compliant surface;5 reps    Standing Eyes Closed  Narrow base of support (BOS);Wide (BOA);Head turns;Foam/compliant surface;5 reps    Tandem Stance  Eyes open;2 reps;10 secs    Rockerboard  Anterior/posterior;Head turns;EO;EC;10 reps;Intermittent UE support    Gait with Head Turns  Forward;Retro;2 reps on blue mat for compliant surface training    Other Standing Exercises  Pt performed marching in place on incline and on decline with EO and EC; with horizontal and vertical head turns      Pt performed standing on pillow in corner - trunk rotation - straight across - diagonal patterns - EO then with EC 5 reps each direction Marching on pillows - with EO and then with EC - 5 reps each   Alternate stepping up/back and down/back on incline/decline with head turns horizontally 5 reps each direction - each leg     PT Long Term Goals - 10/15/17 1558      PT LONG TERM GOAL #1   Title  Pt will report at least 50% improvement in vertigo.    Time  4    Period  Weeks    Status  On-going    Target Date  11/16/17      PT LONG TERM GOAL #2   Title  Pt will improve DHI score by at least 20 points to indicate  improvement in status/vertigo.    Baseline  38% on 09-20-17    Time  4    Period  Weeks    Status  On-going    Target Date  11/16/17      PT LONG TERM GOAL #3   Title  Independent in HEP for vestibular exercises.     Baseline  met 10-14-17    Status  Achieved      PT LONG TERM GOAL #4   Title  Amb. 90' with horizontal head turns with no LOB and no c/o increased vertigo.    Baseline  no major LOB - some unsteadiness noted with amb. with horizontal head turns    Status  Partially Met    Target Date  11/16/17            Plan - 10/15/17 1602    Clinical Impression Statement  Pt has met LTG #3 and partially met LTG #4; pt continues to have some unsteadiness with amb. with head turns but balance is improving with increased steadiness noted on compliant surfaces    Rehab Potential  Good    Clinical Impairments Affecting Rehab Potential  pt has recently obtained prism eyeglasses prescribed by her ophtalmologist    PT Frequency  1x / week    PT Duration  4 weeks    PT Treatment/Interventions  ADLs/Self Care Home Management;Canalith Repostioning;Gait training;Therapeutic activities;Therapeutic exercise;Balance training;Neuromuscular re-education;Patient/family education;Vestibular    PT Next Visit Plan  continue vestibular/balance exercises; renewal completed for 4 weeks    PT Home Exercise Plan  x1 viewing was given on 09-14-17    Consulted and Agree with Plan of Care  Patient       Patient will benefit from skilled therapeutic intervention in order to improve the following deficits and impairments:  Decreased balance, Difficulty walking, Dizziness  Visit Diagnosis: Unsteadiness on feet - Plan: PT plan of care cert/re-cert  Dizziness and giddiness - Plan: PT plan of care cert/re-cert     Problem List Patient Active Problem List   Diagnosis Date Noted  . 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 Anxiety disorder 01/04/2015  .  Essential (primary) hypertension 01/04/2015  . Hypothyroidism 01/04/2015  . Pure hypercholesterolemia 01/04/2015  . Right upper quadrant pain 01/01/2015  . Vertigo, labyrinthine 11/17/2012  . Snoring 11/17/2012    DildayJenness Corner, PT 10/15/2017, 4:16 PM  [facility] [address] [place], [place], [address] Phone: [phone]   Fax:  [phone]  Name: Jodi [name] MRN: [id number] Date of Birth: 05/24/1951

## 2017-10-21 ENCOUNTER — Encounter: Payer: Self-pay | Admitting: Physical Therapy

## 2017-10-21 ENCOUNTER — Ambulatory Visit: Payer: Medicare Other | Admitting: Physical Therapy

## 2017-10-21 DIAGNOSIS — R42 Dizziness and giddiness: Secondary | ICD-10-CM

## 2017-10-21 DIAGNOSIS — R2681 Unsteadiness on feet: Secondary | ICD-10-CM

## 2017-10-21 NOTE — Therapy (Signed)
[facility] [address] [place], [place], [address] Phone: [phone]   Fax:  [phone]  Physical Therapy Treatment  Patient Details  Name: Jodi [name] MRN: [id number] Date of Birth: 1951-04-20 Referring Provider: Carol Ada, MD   Encounter Date: 10/21/2017  PT End of Session - 10/21/17 1611    Visit Number  6    Number of Visits  8    Date for PT Re-Evaluation  11/13/17    Authorization Type  [employer] Medicare    Authorization Time Period  09-14-17 - 11-13-17; 10-14-17 -12-13-17    PT Start Time  0847    PT Stop Time  0931    PT Time Calculation (min)  44 min    Equipment Utilized During Treatment  Gait belt       Past Medical History:  Diagnosis Date  . Abnormal Pap smear of cervix   . Anxiety   . Fatty liver disease, nonalcoholic   . H/O dizziness   . History of depression    incest survivor/dysthymia  . Hypertension   . IBS (irritable bowel syndrome)    lactose intolerance  . Restless leg syndrome   . Sleep apnea   . Snoring 11/17/2012  . Thyroid disease    hypothyroid  . Varicose veins     Past Surgical History:  Procedure Laterality Date  . CHOLECYSTECTOMY    . COLPOSCOPY  99,00,02,04,06  . ENDOMETRIAL BIOPSY  2/09, 1/12   09-NEG, 2012-benign proliferative endometrium  . ENDOVENOUS ABLATION SAPHENOUS VEIN W/ LASER Right 04-08-2015   endovenous laser ablation (right greater saphenous vein) by Victorino Dike MD    . LEEP  3/09   CIN II/III    There were no vitals filed for this visit.  Subjective Assessment - 10/21/17 1558    Subjective  Pt states she suddenly felt off balance on Saturday; did alot of yard work and moving bags of mulch in garage yesterday which required looking down - states this bothered her neck and she has not felt good today- has felt nauseous and off balance     Pertinent History  sleep apnea, h/o vertigo (labyrinthine per chart note), HTN, depression/anxiety    Patient  Stated Goals  resolve the vertigo    Currently in Pain?  No/denies                       [facility] Adult PT Treatment/Exercise - 10/21/17 0001      Exercises   Exercises  Neck      Neck Exercises: Standing   Neck Retraction  5 reps 3 sec hold      Neck Exercises: Seated   Cervical Rotation  10 reps;Right;Left    Lateral Flexion  10 reps;Right;Left          Balance Exercises - 10/21/17 1608      Balance Exercises: Standing   Standing Eyes Opened  Narrow base of support (BOS);Wide (BOA);Head turns;Foam/compliant surface;5 reps    Standing Eyes Closed  Narrow base of support (BOS);Wide (BOA);Head turns;Foam/compliant surface;5 reps    Gait with Head Turns  Forward;3 reps pt made circles with ball clockwise, counterclockwise with S    Other Standing Exercises  Pt performed marching in place on incline and on decline with EO and EC; with horizontal and vertical head turns      Pt had (-) [employer]-Hallpike test with no nystagmus and no c/o increased vertigo in test positions  Pt amb. Making circles clockwise , counterclockwise, "v" pattern and "t" patterns for improved VOR/gaze stabilization - approx. 49'  Each direction with SBA  Pt made figure 8's with small green ball between legs - standing on blue mat - 180 degree turn x 5 reps with CGA for habituation      PT Long Term Goals - 10/15/17 1558      PT LONG TERM GOAL #1   Title  Pt will report at least 50% improvement in vertigo.    Time  4    Period  Weeks    Status  On-going    Target Date  11/16/17      PT LONG TERM GOAL #2   Title  Pt will improve DHI score by at least 20 points to indicate improvement in status/vertigo.    Baseline  38% on 09-20-17    Time  4    Period  Weeks    Status  On-going    Target Date  11/16/17      PT LONG TERM GOAL #3   Title  Independent in HEP for vestibular exercises.     Baseline  met 10-14-17    Status  Achieved      PT LONG TERM GOAL #4   Title  Amb. 51'  with horizontal head turns with no LOB and no c/o increased vertigo.    Baseline  no major LOB - some unsteadiness noted with amb. with horizontal head turns    Status  Partially Met    Target Date  11/16/17            Plan - 10/21/17 1613    Clinical Impression Statement  Pt states vertigo was improved at end of session - was able to look up while walking and not looking down at floor as she was at beginning of session; pt also reported less nausea at end of session after doing exercises    Rehab Potential  Good    Clinical Impairments Affecting Rehab Potential  pt has recently obtained prism eyeglasses prescribed by her ophtalmologist    PT Frequency  1x / week    PT Duration  4 weeks    PT Treatment/Interventions  ADLs/Self Care Home Management;Canalith Repostioning;Gait training;Therapeutic activities;Therapeutic exercise;Balance training;Neuromuscular re-education;Patient/family education;Vestibular    PT Next Visit Plan  continue vestibular/balance exercises; renewal completed for 4 weeks    PT Home Exercise Plan  x1 viewing was given on 09-14-17    Consulted and Agree with Plan of Care  Patient       Patient will benefit from skilled therapeutic intervention in order to improve the following deficits and impairments:  Decreased balance, Difficulty walking, Dizziness  Visit Diagnosis: Unsteadiness on feet  Dizziness and giddiness     Problem List Patient Active Problem List   Diagnosis Date Noted  . 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 Anxiety disorder 01/04/2015  . Essential (primary) hypertension 01/04/2015  . Hypothyroidism 01/04/2015  . Pure hypercholesterolemia 01/04/2015  . Right upper quadrant pain 01/01/2015  . Vertigo, labyrinthine 11/17/2012  . Snoring 11/17/2012    DildayJenness Corner, PT 10/21/2017, 4:20 PM  [facility] [address] [place], [place], [address] Phone: [phone]   Fax:  [phone]  Name: Jodi [name] MRN: [id number] Date of Birth: 1950/10/06

## 2017-10-21 NOTE — Patient Instructions (Signed)
Neck: Retraction    Sit with back and head straight. Pull chin back to line up ear with shoulder. Do not turn or tilt head. May assist if child cannot keep correct position. Hold _3-5___ seconds. Repeat _8-10___ times. Do __1-2_ sessions per day. CAUTION: Movement should be gentle, steady and slow.  DO against wall or lying down - use bed or use headrest in car   Neck Flexion    Begin with chin level, head centered over spine. Slowly lower chin toward chest. Hold ___3_ seconds. Slowly return to starting position. Repeat __8-10__ times. ALSO LOOK UP for cervical extension - combine this with the flexion    AROM: Lateral Neck Flexion    Slowly tilt head toward one shoulder, then the other. Hold each position __3__ seconds. Repeat _8-10___ times per set. Do _1___ sets per session. Do _1-2___ sessions per day.   Use hand to pull  Gently head toward shoulder http://orth.exer.us/297   Copyright  V[employer]. All rights reserved.  AROM: Neck Rotation    Turn head slowly to look over one shoulder, then the other. Hold each position _3-5___ seconds. Repeat _8-10___ times per set. Do __1__ sets per session. Do _1-2___ sessions per day.  http://orth.exer.us/295

## 2017-11-02 ENCOUNTER — Ambulatory Visit: Payer: Medicare Other | Admitting: Physical Therapy

## 2017-11-02 ENCOUNTER — Encounter: Payer: Self-pay | Admitting: Physical Therapy

## 2017-11-02 DIAGNOSIS — R42 Dizziness and giddiness: Secondary | ICD-10-CM | POA: Diagnosis not present

## 2017-11-02 DIAGNOSIS — R2681 Unsteadiness on feet: Secondary | ICD-10-CM

## 2017-11-02 NOTE — Therapy (Signed)
[facility] [address] [place], [place], [address] Phone: [phone]   Fax:  [phone]  Physical Therapy Treatment  Patient Details  Name: Jodi [name] MRN: [id number] Date of Birth: [birth year] Referring Provider: Carol Ada, MD   Encounter Date: 11/02/2017  PT End of Session - 11/02/17 2041    Visit Number  7    Number of Visits  8    Date for PT Re-Evaluation  11/13/17    Authorization Type  [employer] Medicare    Authorization Time Period  09-14-17 - 11-13-17; 10-14-17 -12-13-17    PT Start Time  0932    PT Stop Time  1016    PT Time Calculation (min)  44 min       Past Medical History:  Diagnosis Date  . Abnormal Pap smear of cervix   . Anxiety   . Fatty liver disease, nonalcoholic   . H/O dizziness   . History of depression    incest survivor/dysthymia  . Hypertension   . IBS (irritable bowel syndrome)    lactose intolerance  . Restless leg syndrome   . Sleep apnea   . Snoring 11/17/2012  . Thyroid disease    hypothyroid  . Varicose veins     Past Surgical History:  Procedure Laterality Date  . CHOLECYSTECTOMY    . COLPOSCOPY  99,00,02,04,06  . ENDOMETRIAL BIOPSY  2/09, 1/12   09-NEG, 2012-benign proliferative endometrium  . ENDOVENOUS ABLATION SAPHENOUS VEIN W/ LASER Right 04-08-2015   endovenous laser ablation (right greater saphenous vein) by Victorino Dike MD    . LEEP  3/09   CIN II/III    There were no vitals filed for this visit.  Subjective Assessment - 11/02/17 2038    Subjective  Pt states she did yard work last week and did fine - did not have dizziness like she had 2 weeks ago when she did yard work; pt states "today is a good day"    Pertinent History  sleep apnea, h/o vertigo (labyrinthine per chart note), HTN, depression/anxiety    Patient Stated Goals  resolve the vertigo    Currently in Pain?  No/denies                   Pt amb. making circles clockwise ,  counterclockwise, "v" pattern and "t" patterns for improved VOR/gaze stabilization - approx. 69'  Each direction with SBA  Pt made figure 8's with small green ball between legs - standing on blue mat - 180 degree turn x 5 reps with CGA for habituation    Instructed pt in Mulligan's stretch with use of towel for cervical rotation stretch       Balance Exercises - 11/02/17 2040      Balance Exercises: Standing   Standing Eyes Opened  Narrow base of support (BOS);Wide (BOA);Head turns;Foam/compliant surface;5 reps    Standing Eyes Closed  Narrow base of support (BOS);Wide (BOA);Head turns;Foam/compliant surface;5 reps    Tandem Stance  Eyes open;2 reps;10 secs    SLS with Vectors  Foam/compliant surface;5 reps standing on inverted Bosu - with UE support prn    Gait with Head Turns  Forward;Retro;2 reps on blue mat for compliant surface training    Other Standing Exercises  Pt performed marching in place on incline and on decline with EO and EC; with horizontal and vertical head turns             PT Long Term Goals - 10/15/17  [place] #1   Title  Pt will report at least 50% improvement in vertigo.    Time  4    Period  Weeks    Status  On-going    Target Date  11/16/17      PT LONG TERM GOAL #2   Title  Pt will improve DHI score by at least 20 points to indicate improvement in status/vertigo.    Baseline  38% on 09-20-17    Time  4    Period  Weeks    Status  On-going    Target Date  11/16/17      PT LONG TERM GOAL #3   Title  Independent in HEP for vestibular exercises.     Baseline  met 10-14-17    Status  Achieved      PT LONG TERM GOAL #4   Title  Amb. 25' with horizontal head turns with no LOB and no c/o increased vertigo.    Baseline  no major LOB - some unsteadiness noted with amb. with horizontal head turns    Status  Partially Met    Target Date  11/16/17            Plan - 11/02/17 2042    Clinical Impression Statement  Pt  tolerated exercises very well with no c/o dizziness; no major LOB occurred with any of the vestibular activities, indicative of increased vestibular input in maintaining balance; pt is progressing well towards goals    Rehab Potential  Good    Clinical Impairments Affecting Rehab Potential  pt has recently obtained prism eyeglasses prescribed by her ophtalmologist    PT Frequency  1x / week    PT Duration  4 weeks    PT Treatment/Interventions  ADLs/Self Care Home Management;Canalith Repostioning;Gait training;Therapeutic activities;Therapeutic exercise;Balance training;Neuromuscular re-education;Patient/family education;Vestibular    PT Next Visit Plan  continue vestibular/balance exercises;     PT Home Exercise Plan  x1 viewing was given on 09-14-17    Consulted and Agree with Plan of Care  Patient       Patient will benefit from skilled therapeutic intervention in order to improve the following deficits and impairments:  Decreased balance, Difficulty walking, Dizziness  Visit Diagnosis: Unsteadiness on feet  Dizziness and giddiness     Problem List Patient Active Problem List   Diagnosis Date Noted  . 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 Anxiety disorder 01/04/2015  . Essential (primary) hypertension 01/04/2015  . Hypothyroidism 01/04/2015  . Pure hypercholesterolemia 01/04/2015  . Right upper quadrant pain 01/01/2015  . Vertigo, labyrinthine 11/17/2012  . Snoring 11/17/2012    DildayJenness Corner, PT 11/02/2017, 8:45 PM  [facility] [address] [place], [place], [address] Phone: [phone]   Fax:  [phone]  Name: DYLANIE QUESENBERRY MRN: [id number] Date of Birth: 02/27/1951

## 2017-11-09 ENCOUNTER — Ambulatory Visit: Payer: Medicare Other | Attending: Family Medicine | Admitting: Physical Therapy

## 2017-11-09 DIAGNOSIS — R42 Dizziness and giddiness: Secondary | ICD-10-CM | POA: Insufficient documentation

## 2017-11-09 DIAGNOSIS — R2689 Other abnormalities of gait and mobility: Secondary | ICD-10-CM

## 2017-11-09 DIAGNOSIS — R2681 Unsteadiness on feet: Secondary | ICD-10-CM | POA: Diagnosis present

## 2017-11-10 ENCOUNTER — Encounter: Payer: Self-pay | Admitting: Physical Therapy

## 2017-11-10 NOTE — Therapy (Signed)
[facility] [address] [place], [place], [address] Phone: [phone]   Fax:  [phone]  Physical Therapy Treatment  Patient Details  Name: Jodi [name] MRN: [id number] Date of Birth: 01-04-1951 Referring Provider: Carol Ada, MD   Encounter Date: 11/09/2017  PT End of Session - 11/10/17 0956    Visit Number  8    Number of Visits  9    Date for PT Re-Evaluation  11/23/17    Authorization Type  [employer]HC Medicare    Authorization Time Period  09-14-17 - 11-13-17; 10-14-17 -12-13-17    PT Start Time  0848    PT Stop Time  0932    PT Time Calculation (min)  44 min    Equipment Utilized During Treatment  Gait belt       Past Medical History:  Diagnosis Date  . Abnormal Pap smear of cervix   . Anxiety   . Fatty liver disease, nonalcoholic   . H/O dizziness   . History of depression    incest survivor/dysthymia  . Hypertension   . IBS (irritable bowel syndrome)    lactose intolerance  . Restless leg syndrome   . Sleep apnea   . Snoring 11/17/2012  . Thyroid disease    hypothyroid  . Varicose veins     Past Surgical History:  Procedure Laterality Date  . CHOLECYSTECTOMY    . COLPOSCOPY  99,00,02,04,06  . ENDOMETRIAL BIOPSY  2/09, 1/12   09-NEG, 2012-benign proliferative endometrium  . ENDOVENOUS ABLATION SAPHENOUS VEIN W/ LASER Right 04-08-2015   endovenous laser ablation (right greater saphenous vein) by Victorino Dike MD    . LEEP  3/09   CIN II/III    There were no vitals filed for this visit.  Subjective Assessment - 11/10/17 0953    Subjective  Pt states she is doing much better - is pleased with her progress    Pertinent History  sleep apnea, h/o vertigo (labyrinthine per chart note), HTN, depression/anxiety    Patient Stated Goals  resolve the vertigo    Currently in Pain?  No/denies                   Pt performed seated bouncing on blue physioball with EO, EC and with horizontal and  vertical head turns for otolith stimulation and  For increased vestibular input in maintaining balance - no dizziness provoked with this activity   Pt performed alternate stepping up/back and down/back with EO/EC and with head turns 5 reps each with CGA on incline/decline (blue mat on incline for compliant surface training)  Pt amb. Forward and backward with EC 2 reps each on incline/decline  Pt performed trunk rotations - in corner - standing on blue mat for compliant surface - straight across and then diagonal patterns each direction          Balance Exercises - 11/10/17 0954      Balance Exercises: Standing   Standing Eyes Opened  Narrow base of support (BOS);Wide (BOA);Head turns;Foam/compliant surface;5 reps    Standing Eyes Closed  Narrow base of support (BOS);Wide (BOA);Head turns;Foam/compliant surface;5 reps    Tandem Stance  Eyes open;Eyes closed;Foam/compliant surface;5 reps with CGA    Rockerboard  Anterior/posterior;Head turns;EO;EC;10 reps;Intermittent UE support    Other Standing Exercises  Pt performed marching in place on incline and on decline with EO and EC; with horizontal and vertical head turns  PT Long Term Goals - 10/15/17 1558      PT LONG TERM GOAL #1   Title  Pt will report at least 50% improvement in vertigo.    Time  4    Period  Weeks    Status  On-going    Target Date  11/16/17      PT LONG TERM GOAL #2   Title  Pt will improve DHI score by at least 20 points to indicate improvement in status/vertigo.    Baseline  38% on 09-20-17    Time  4    Period  Weeks    Status  On-going    Target Date  11/16/17      PT LONG TERM GOAL #3   Title  Independent in HEP for vestibular exercises.     Baseline  met 10-14-17    Status  Achieved      PT LONG TERM GOAL #4   Title  Amb. 47' with horizontal head turns with no LOB and no c/o increased vertigo.    Baseline  no major LOB - some unsteadiness noted with amb. with horizontal head  turns    Status  Partially Met    Target Date  11/16/17            Plan - 11/10/17 0958    Clinical Impression Statement  Pt is progressing well towards goals; able to maintain balance much better with standng on compliant surface with EC and with head turns; continues to have most difficulty maintaining balance with EC on compliant surfaces    Rehab Potential  Good    Clinical Impairments Affecting Rehab Potential  pt has recently obtained prism eyeglasses prescribed by her ophtalmologist    PT Frequency  1x / week    PT Duration  4 weeks    PT Treatment/Interventions  ADLs/Self Care Home Management;Canalith Repostioning;Gait training;Therapeutic activities;Therapeutic exercise;Balance training;Neuromuscular re-education;Patient/family education;Vestibular    PT Next Visit Plan  continue vestibular/balance exercises;     PT Home Exercise Plan  x1 viewing was given on 09-14-17    Consulted and Agree with Plan of Care  Patient       Patient will benefit from skilled therapeutic intervention in order to improve the following deficits and impairments:  Decreased balance, Difficulty walking, Dizziness  Visit Diagnosis: Unsteadiness on feet  Other abnormalities of gait and mobility  Dizziness and giddiness     Problem List Patient Active Problem List   Diagnosis Date Noted  . 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 Anxiety disorder 01/04/2015  . Essential (primary) hypertension 01/04/2015  . Hypothyroidism 01/04/2015  . Pure hypercholesterolemia 01/04/2015  . Right upper quadrant pain 01/01/2015  . Vertigo, labyrinthine 11/17/2012  . Snoring 11/17/2012    Alda Lea, PT 11/10/2017, 10:04 AM  [facility] [address] [place], [place], [address] Phone: [phone]   Fax:  [phone]  Name: RAZAN SILER MRN: [id number] Date of Birth:  07-06-1951

## 2017-11-16 ENCOUNTER — Ambulatory Visit: Payer: Medicare Other | Admitting: Physical Therapy

## 2017-11-16 DIAGNOSIS — R2681 Unsteadiness on feet: Secondary | ICD-10-CM | POA: Diagnosis not present

## 2017-11-16 DIAGNOSIS — R42 Dizziness and giddiness: Secondary | ICD-10-CM

## 2017-11-16 NOTE — Patient Instructions (Signed)
1) Forward kicks on mat - add head turns - horizontal and vertical  2) Back kicks and side kicks - as above  3) Walking backwards  4) Partial tandem stance - eyes closed - trunk rotation  5) Staggered step on mat - eyes closed - weight shifts front to back

## 2017-11-17 ENCOUNTER — Encounter: Payer: Self-pay | Admitting: Physical Therapy

## 2017-11-17 NOTE — Therapy (Signed)
[facility] [address] [place], [place], [address] Phone: [phone]   Fax:  [phone]  Physical Therapy Treatment  Patient Details  Name: Jodi [name] MRN: [id number] Date of Birth: [birth year] Referring Provider: Carol Ada, MD   Encounter Date: 11/16/2017  PT End of Session - 11/17/17 1351    Visit Number  9    Number of Visits  11    Date for PT Re-Evaluation  11/23/17    Authorization Type  [employer]HC Medicare    Authorization Time Period  09-14-17 - 11-13-17; 10-14-17 -12-13-17    PT Start Time  0848    PT Stop Time  0931    PT Time Calculation (min)  43 min       Past Medical History:  Diagnosis Date  . Abnormal Pap smear of cervix   . Anxiety   . Fatty liver disease, nonalcoholic   . H/O dizziness   . History of depression    incest survivor/dysthymia  . Hypertension   . IBS (irritable bowel syndrome)    lactose intolerance  . Restless leg syndrome   . Sleep apnea   . Snoring 11/17/2012  . Thyroid disease    hypothyroid  . Varicose veins     Past Surgical History:  Procedure Laterality Date  . CHOLECYSTECTOMY    . COLPOSCOPY  99,00,02,04,06  . ENDOMETRIAL BIOPSY  2/09, 1/12   09-NEG, 2012-benign proliferative endometrium  . ENDOVENOUS ABLATION SAPHENOUS VEIN W/ LASER Right 04-08-2015   endovenous laser ablation (right greater saphenous vein) by Victorino Dike MD    . LEEP  3/09   CIN II/III    There were no vitals filed for this visit.  Subjective Assessment - 11/17/17 1345    Subjective  Pt states she is doing much better - able to do yard work, planting without excessive vertigo; states she is able to manage her vertigo now if it should occur    Pertinent History  sleep apnea, h/o vertigo (labyrinthine per chart note), HTN, depression/anxiety    Patient Stated Goals  resolve the vertigo    Currently in Pain?  No/denies                    Pt made figure 8's with small green  ball between legs - standing on blue mat - 180 degree turn x 5 reps with CGA for habituation         Balance Exercises - 11/17/17 1348      Balance Exercises: Standing   Standing Eyes Opened  Narrow base of support (BOS);Wide (BOA);Head turns;Foam/compliant surface;5 reps    Standing Eyes Closed  Narrow base of support (BOS);Wide (BOA);Head turns;Foam/compliant surface;5 reps    Tandem Stance  Eyes open;2 reps;10 secs    SLS with Vectors  Foam/compliant surface;5 reps standing on inverted Bosu - with UE support prn    Rockerboard  Anterior/posterior;Head turns;EO;EC;10 reps    Gait with Head Turns  Forward;2 reps 14' with horizontal    Other Standing Exercises  Pt performed marching in place on incline and on decline with EO and EC; with horizontal and vertical head turns             PT Long Term Goals - 10/15/17 1558      PT LONG TERM GOAL #1   Title  Pt will report at least 50% improvement in vertigo.    Time  4    Period  Weeks  Status  On-going    Target Date  11/16/17      PT LONG TERM GOAL #2   Title  Pt will improve DHI score by at least 20 points to indicate improvement in status/vertigo.    Baseline  38% on 09-20-17    Time  4    Period  Weeks    Status  On-going    Target Date  11/16/17      PT LONG TERM GOAL #3   Title  Independent in HEP for vestibular exercises.     Baseline  met 10-14-17    Status  Achieved      PT LONG TERM GOAL #4   Title  Amb. 66' with horizontal head turns with no LOB and no c/o increased vertigo.    Baseline  no major LOB - some unsteadiness noted with amb. with horizontal head turns    Status  Partially Met    Target Date  11/16/17            Plan - 11/17/17 1351    Clinical Impression Statement  Pt continues to progress well towards goals - able to maintain balance much better on compliant surfaces with head turns or with EC, demonstrating improved vestibular input    Rehab Potential  Good    Clinical Impairments  Affecting Rehab Potential  pt has recently obtained prism eyeglasses prescribed by her ophtalmologist    PT Frequency  1x / week    PT Duration  4 weeks    PT Treatment/Interventions  ADLs/Self Care Home Management;Canalith Repostioning;Gait training;Therapeutic activities;Therapeutic exercise;Balance training;Neuromuscular re-education;Patient/family education;Vestibular    PT Next Visit Plan  continue vestibular/balance exercises;     PT Home Exercise Plan  x1 viewing was given on 09-14-17    Consulted and Agree with Plan of Care  Patient       Patient will benefit from skilled therapeutic intervention in order to improve the following deficits and impairments:  Decreased balance, Difficulty walking, Dizziness  Visit Diagnosis: Unsteadiness on feet  Dizziness and giddiness     Problem List Patient Active Problem List   Diagnosis Date Noted  . 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 Anxiety disorder 01/04/2015  . Essential (primary) hypertension 01/04/2015  . Hypothyroidism 01/04/2015  . Pure hypercholesterolemia 01/04/2015  . Right upper quadrant pain 01/01/2015  . Vertigo, labyrinthine 11/17/2012  . Snoring 11/17/2012    DildayJenness Corner, PT 11/17/2017, 1:54 PM  [facility] [address] [place], [place], [address] Phone: [phone]   Fax:  [phone]  Name: Jodi [name] MRN: [id number] Date of Birth: [birth year]

## 2017-11-23 ENCOUNTER — Ambulatory Visit: Payer: Medicare Other | Admitting: Physical Therapy

## 2017-11-23 DIAGNOSIS — R2689 Other abnormalities of gait and mobility: Secondary | ICD-10-CM

## 2017-11-23 DIAGNOSIS — R2681 Unsteadiness on feet: Secondary | ICD-10-CM | POA: Diagnosis not present

## 2017-11-24 ENCOUNTER — Encounter: Payer: Self-pay | Admitting: Physical Therapy

## 2017-11-24 NOTE — Therapy (Addendum)
[facility] [address] [place], [place], [address] Phone: [phone]   Fax:  [phone]  Physical Therapy Treatment  Patient Details  Name: Jodi [name] MRN: [id number] Date of Birth: [birth year] Referring Provider: Carol Ada, MD   Encounter Date: 11/23/2017  PT End of Session - 11/24/17 2244    Visit Number  10    Number of Visits  11    Date for PT Re-Evaluation  11/23/17    Authorization Type  [employer]HC Medicare    Authorization Time Period  09-14-17 - 11-13-17; 10-14-17 -12-13-17    PT Start Time  0849    PT Stop Time  0931    PT Time Calculation (min)  42 min       Past Medical History:  Diagnosis Date  . Abnormal Pap smear of cervix   . Anxiety   . Fatty liver disease, nonalcoholic   . H/O dizziness   . History of depression    incest survivor/dysthymia  . Hypertension   . IBS (irritable bowel syndrome)    lactose intolerance  . Restless leg syndrome   . Sleep apnea   . Snoring 11/17/2012  . Thyroid disease    hypothyroid  . Varicose veins     Past Surgical History:  Procedure Laterality Date  . CHOLECYSTECTOMY    . COLPOSCOPY  99,00,02,04,06  . ENDOMETRIAL BIOPSY  2/09, 1/12   09-NEG, 2012-benign proliferative endometrium  . ENDOVENOUS ABLATION SAPHENOUS VEIN W/ LASER Right 04-08-2015   endovenous laser ablation (right greater saphenous vein) by Victorino Dike MD    . LEEP  3/09   CIN II/III    There were no vitals filed for this visit.  Subjective Assessment - 11/24/17 2244    Subjective  Pt states she is still doing good - had good balance standing on pillow last night with exercises     Pertinent History  sleep apnea, h/o vertigo (labyrinthine per chart note), HTN, depression/anxiety    Patient Stated Goals  resolve the vertigo    Currently in Pain?  No/denies         Sensory Organization Test:  Composite score 75/100 (N=70/100) Somatosensory, visual and vestibular inputs are all  WNL's  Condition 1 - all 3 trials WNL's Condition 2 - trials 1 and 2 slightly below N with trial 3 WNL's Condition 3 - all 3 trials WNL's Condition 4 - trial 1 below N; trials 1 and 2 WNL's Condition 5 - all 3 trials WNL's Condition 6 - trials and 1 WNL's; trial 3 slightly below N  Reviewed HEP; reviewed and discussed LTG's and progress  Pt completed DHI - score 12% with 0 yes answers, 6 sometimes, 19 no answers (significant improvement from initial 38% score)                           PT Long Term Goals - 11/24/17 2245      PT LONG TERM GOAL #1   Title  Pt will report at least 50% improvement in vertigo.    Baseline  met 11-23-17    Status  Achieved      PT LONG TERM GOAL #2   Title  Pt will improve DHI score by at least 20 points to indicate improvement in status/vertigo.    Baseline  12% on 11-23-17    Status  Achieved      PT LONG TERM GOAL #3   Title  Independent in HEP for vestibular exercises.     Status  Achieved      PT LONG TERM GOAL #4   Title  Amb. 1' with horizontal head turns with no LOB and no c/o increased vertigo.    Status  Achieved              Patient will benefit from skilled therapeutic intervention in order to improve the following deficits and impairments:     Visit Diagnosis: Unsteadiness on feet  Other abnormalities of gait and mobility     Problem List Patient Active Problem List   Diagnosis Date Noted  . 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 Anxiety disorder 01/04/2015  . Essential (primary) hypertension 01/04/2015  . Hypothyroidism 01/04/2015  . Pure hypercholesterolemia 01/04/2015  . Right upper quadrant pain 01/01/2015  . Vertigo, labyrinthine 11/17/2012  . Snoring 11/17/2012    PHYSICAL THERAPY DISCHARGE SUMMARY  Visits from Start of Care: 10  Current functional level related to goals / functional outcomes: See above for progress towards  goals - all goals met   Remaining deficits: Occasional c/o vertigo with quick movements but vertigo has signficantly decreased since time of eval on 09-14-17   Education / Equipment: Pt has been instructed in balance/vestibular exercises and reports compliance with this HEP Plan: Patient agrees to discharge.  Patient goals were met. Patient is being discharged due to meeting the stated rehab goals.  ?????         Alda Lea, PT 11/24/2017, 10:50 PM  [facility] [address] [place], [place], [address] Phone: [phone]   Fax:  [phone]  Name: ZAKYIA GAGAN MRN: [id number] Date of Birth: 06-Sep-1950

## 2017-12-21 ENCOUNTER — Ambulatory Visit: Payer: Medicare Other | Admitting: Internal Medicine

## 2017-12-21 ENCOUNTER — Other Ambulatory Visit (INDEPENDENT_AMBULATORY_CARE_PROVIDER_SITE_OTHER): Payer: Medicare Other

## 2017-12-21 ENCOUNTER — Encounter: Payer: Self-pay | Admitting: Internal Medicine

## 2017-12-21 VITALS — BP 140/76 | HR 92 | Temp 99.1°F | Ht 65.25 in | Wt 182.2 lb

## 2017-12-21 DIAGNOSIS — K589 Irritable bowel syndrome without diarrhea: Secondary | ICD-10-CM | POA: Diagnosis not present

## 2017-12-21 DIAGNOSIS — R1084 Generalized abdominal pain: Secondary | ICD-10-CM

## 2017-12-21 DIAGNOSIS — R110 Nausea: Secondary | ICD-10-CM | POA: Diagnosis not present

## 2017-12-21 LAB — CBC WITH DIFFERENTIAL/PLATELET
BASOS PCT: 0.5 % (ref 0.0–3.0)
Basophils Absolute: 0 10*3/uL (ref 0.0–0.1)
EOS PCT: 0.8 % (ref 0.0–5.0)
Eosinophils Absolute: 0 10*3/uL (ref 0.0–0.7)
HEMATOCRIT: 42.9 % (ref 36.0–46.0)
HEMOGLOBIN: 14.7 g/dL (ref 12.0–15.0)
LYMPHS PCT: 25.2 % (ref 12.0–46.0)
Lymphs Abs: 1.5 10*3/uL (ref 0.7–4.0)
MCHC: 34.4 g/dL (ref 30.0–36.0)
MCV: 91.7 fl (ref 78.0–100.0)
MONOS PCT: 5.5 % (ref 3.0–12.0)
Monocytes Absolute: 0.3 10*3/uL (ref 0.1–1.0)
Neutro Abs: 4 10*3/uL (ref 1.4–7.7)
Neutrophils Relative %: 68 % (ref 43.0–77.0)
Platelets: 159 10*3/uL (ref 150.0–400.0)
RBC: 4.67 Mil/uL (ref 3.87–5.11)
RDW: 13.5 % (ref 11.5–15.5)
WBC: 5.9 10*3/uL (ref 4.0–10.5)

## 2017-12-21 LAB — COMPREHENSIVE METABOLIC PANEL
ALBUMIN: 4.4 g/dL (ref 3.5–5.2)
ALK PHOS: 54 U/L (ref 39–117)
ALT: 18 U/L (ref 0–35)
AST: 17 U/L (ref 0–37)
BUN: 9 mg/dL (ref 6–23)
CHLORIDE: 104 meq/L (ref 96–112)
CO2: 31 mEq/L (ref 19–32)
Calcium: 9.8 mg/dL (ref 8.4–10.5)
Creatinine, Ser: 0.68 mg/dL (ref 0.40–1.20)
GFR: 91.77 mL/min (ref >60.00)
Glucose, Bld: 100 mg/dL — ABNORMAL HIGH (ref 70–99)
POTASSIUM: 4.2 meq/L (ref 3.5–5.1)
SODIUM: 141 meq/L (ref 135–145)
TOTAL PROTEIN: 7.2 g/dL (ref 6.0–8.3)
Total Bilirubin: 0.6 mg/dL (ref 0.2–1.2)

## 2017-12-21 MED ORDER — ONDANSETRON 4 MG PO TBDP: 4.0000 mg | ORAL_TABLET | Freq: Three times a day (TID) | ORAL | 5 refills | Status: DC | PRN

## 2017-12-21 NOTE — Progress Notes (Signed)
Subjective:    Patient ID: Jodi [name], female    DOB: [birth year], 67 y.o.   MRN: [id number]  HPI Areen Trautner is a 67 year old female with history of chronic left-sided abdominal pain, gallbladder disease status post cholecystectomy in January 2019, IBS who is here for follow-up.  She was last seen on 09/06/2017.  She also has a history of hypothyroidism, sleep apnea and anxiety.  Since the time of her last visit she states she has not had a true flare of her left-sided abdominal pain but still has episodes of abdominal pain in the left upper quadrant but can also experience left lower quadrant and right lower quadrant abdominal pain.  At times when she has this pain she will feel feverish and have nausea.  Today she is having considerable nausea but no vomiting.  She had some loose stools today but usually her stools have been formed without diarrhea.  No blood in her stool or melena.  She continues to have symptoms which are triggered by NSAIDs, coffee, lactose.  She is avoiding all lactose.  She did drink coffee yesterday and wonders if her nausea today relates to coffee intake.  Overall her vertigo has been better after physical therapy.  She is still using Pepto-Bismol as needed but has not yet tried Levsin as prescribed at last visit.  She had a negative Cologuard in October 2017.  Never had a colonoscopy.  No family history of colon cancer.  Review of Systems As per HPI, otherwise negative  Current Medications, Allergies, Past Medical History, Past Surgical History, Family History and Social History were reviewed in [employer] record.     Objective:   Physical Exam BP 140/76 (BP Location: Left Arm, Patient Position: Sitting, Cuff Size: Normal)   Pulse 92   Temp 99.1 F (37.3 C)   Ht 5' 5.25" (1.657 m) Comment: height measured without shoes  Wt 182 lb 4 oz (82.7 kg)   LMP 07/06/2010   BMI 30.10 kg/m  Constitutional: Well-developed and well-nourished. No  distress. HEENT: Normocephalic and atraumatic. Conjunctivae are normal.  No scleral icterus. Neck: Neck supple. Trachea midline. Cardiovascular: Normal rate, regular rhythm and intact distal pulses. No M/R/G Pulmonary/chest: Effort normal and breath sounds normal. No wheezing, rales or rhonchi. Abdominal: Soft, mild right lower quadrant tenderness without rebound or guarding, nondistended. Bowel sounds active throughout. There are no masses palpable. No hepatosplenomegaly. Extremities: no clubbing, cyanosis, trace pretibial edema Neurological: Alert and oriented to person place and time. Skin: Skin is warm and dry. Psychiatric: Normal mood and affect. Behavior is normal.  CBC    Component Value Date/Time   WBC 5.5 06/18/2017 2214   RBC 4.54 06/18/2017 2214   HGB 14.7 06/18/2017 2214   HGB 14.2 12/21/2013 1155   HCT 41.2 06/18/2017 2214   PLT 145 (L) 06/18/2017 2214   MCV 90.7 06/18/2017 2214   MCH 32.4 06/18/2017 2214   MCHC 35.7 06/18/2017 2214   RDW 13.0 06/18/2017 2214   LYMPHSABS 1.2 09/20/2012 0855   MONOABS 0.3 09/20/2012 0855   EOSABS 0.0 09/20/2012 0855   BASOSABS 0.0 09/20/2012 0855   CMP     Component Value Date/Time   NA 138 06/18/2017 2214   K 3.0 (L) 06/18/2017 2214   CL 103 06/18/2017 2214   CO2 26 06/18/2017 2214   GLUCOSE 159 (H) 06/18/2017 2214   BUN 11 06/18/2017 2214   CREATININE 0.66 06/18/2017 2214   CALCIUM 9.1 06/18/2017 2214   GFRNONAA >  60 06/18/2017 2214   GFRAA >60 06/18/2017 2214       Assessment & Plan:  67 year old female with history of chronic left-sided abdominal pain, gallbladder disease status post cholecystectomy in January 2019, IBS who is here for follow-up  1.  Chronic left-sided abdominal pain/intermittent nausea --symptoms are likely irritable in nature.  However she has chronic left upper quadrant abdominal pain but has intermittent left lower and right lower quadrant abdominal pain.  For this reason I have recommended  cross-sectional imaging with CT scan of the abdomen and pelvis.  She is allergic to iodine with an anaphylactic type reaction and so we are going to use oral contrast only.  I am also checking a CBC and CMP today.  If negative this will be reassuring both to her and I.  She should avoid her trigger foods and beverages.  Can use Pepto-Bismol as needed and Lactaid with meals.  Can also try Levsin 0.125 mg every 4-6 hours as needed for left-sided abdominal discomfort.  2.  CRC screening --negative Cologuard in October 2017, repeat in October 2020 versus colonoscopy at that time  25 minutes spent with the patient today. Greater than 50% was spent in counseling and coordination of care with the patient

## 2017-12-21 NOTE — Patient Instructions (Signed)
You have been scheduled for a CT scan of the abdomen and pelvis at [facility] ([address]---this is in the same building as [facility]).   You are scheduled on Thursday 12/23/17 at 10:45 am. You should arrive 15 minutes prior to your appointment time for registration. Please follow the written instructions below on the day of your exam:  1) Do not eat or drink anything after 6:45 am (4 hours prior to your test) 2) You have been given 2 bottles of oral contrast to drink. The solution may taste better if refrigerated, but do NOT add ice or any other liquid to this solution. Shake well before drinking.    Drink 1 bottle of contrast @ 8:45 am (2 hours prior to your exam)  Drink 1 bottle of contrast @ 9:45 am (1 hour prior to your exam)  You may take any medications as prescribed with a small amount of water except for the following: Metformin, Glucophage, Glucovance, Avandamet, Riomet, Fortamet, Actoplus Met, Janumet, Glumetza or Metaglip. The above medications must be held the day of the exam AND 48 hours after the exam.  The purpose of you drinking the oral contrast is to aid in the visualization of your intestinal tract. The contrast solution may cause some diarrhea. Before your exam is started, you will be given a small amount of fluid to drink. Plan on being at [facility] for 30 minutes or longer, depending on the type of exam you are having performed.  This test typically takes 30-45 minutes to complete.  If you have any questions regarding your exam or if you need to reschedule, you may call the CT department at [phone] between the hours of 8:00 am and 5:00 pm, Monday-Friday.  _____________________________________________________________________  Your provider has requested that you go to the basement level for lab work before leaving today. Press "B" on the elevator. The lab is located at the first door on the left as you exit the elevator.  We have  sent the following medications to your pharmacy for you to pick up at your convenience: Zofran   If you are age 67 or older, your body mass index should be between 23-30. Your Body mass index is 30.1 kg/m. If this is out of the aforementioned range listed, please consider follow up with your Primary Care Provider.  If you are age 67 or younger, your body mass index should be between 19-25. Your Body mass index is 30.1 kg/m. If this is out of the aformentioned range listed, please consider follow up with your Primary Care Provider.

## 2017-12-23 ENCOUNTER — Inpatient Hospital Stay: Admission: RE | Admit: 2017-12-23 | Payer: Medicare Other | Source: Ambulatory Visit

## 2017-12-28 ENCOUNTER — Ambulatory Visit (INDEPENDENT_AMBULATORY_CARE_PROVIDER_SITE_OTHER)
Admission: RE | Admit: 2017-12-28 | Discharge: 2017-12-28 | Disposition: A | Discharge status: Home / Self Care | Payer: Medicare Other | Source: Ambulatory Visit | Attending: Internal Medicine | Admitting: Internal Medicine

## 2017-12-28 DIAGNOSIS — R1084 Generalized abdominal pain: Secondary | ICD-10-CM

## 2017-12-29 ENCOUNTER — Other Ambulatory Visit: Payer: Self-pay | Admitting: *Deleted

## 2017-12-29 MED ORDER — AMOXICILLIN-POT CLAVULANATE 875-125 MG PO TABS: ORAL_TABLET | ORAL | 0 refills | Status: DC

## 2017-12-29 MED ORDER — SACCHAROMYCES BOULARDII 250 MG PO CAPS: ORAL_CAPSULE | ORAL | 0 refills | Status: AC

## 2018-01-25 IMAGING — CR DG CHEST 2V
2 series · 2 of 2 positions shown · non-contrast
Comparison: None.

CLINICAL DATA: Initial evaluation for acute chest pain.

EXAM:
CHEST  2 VIEW

[w chest pa]
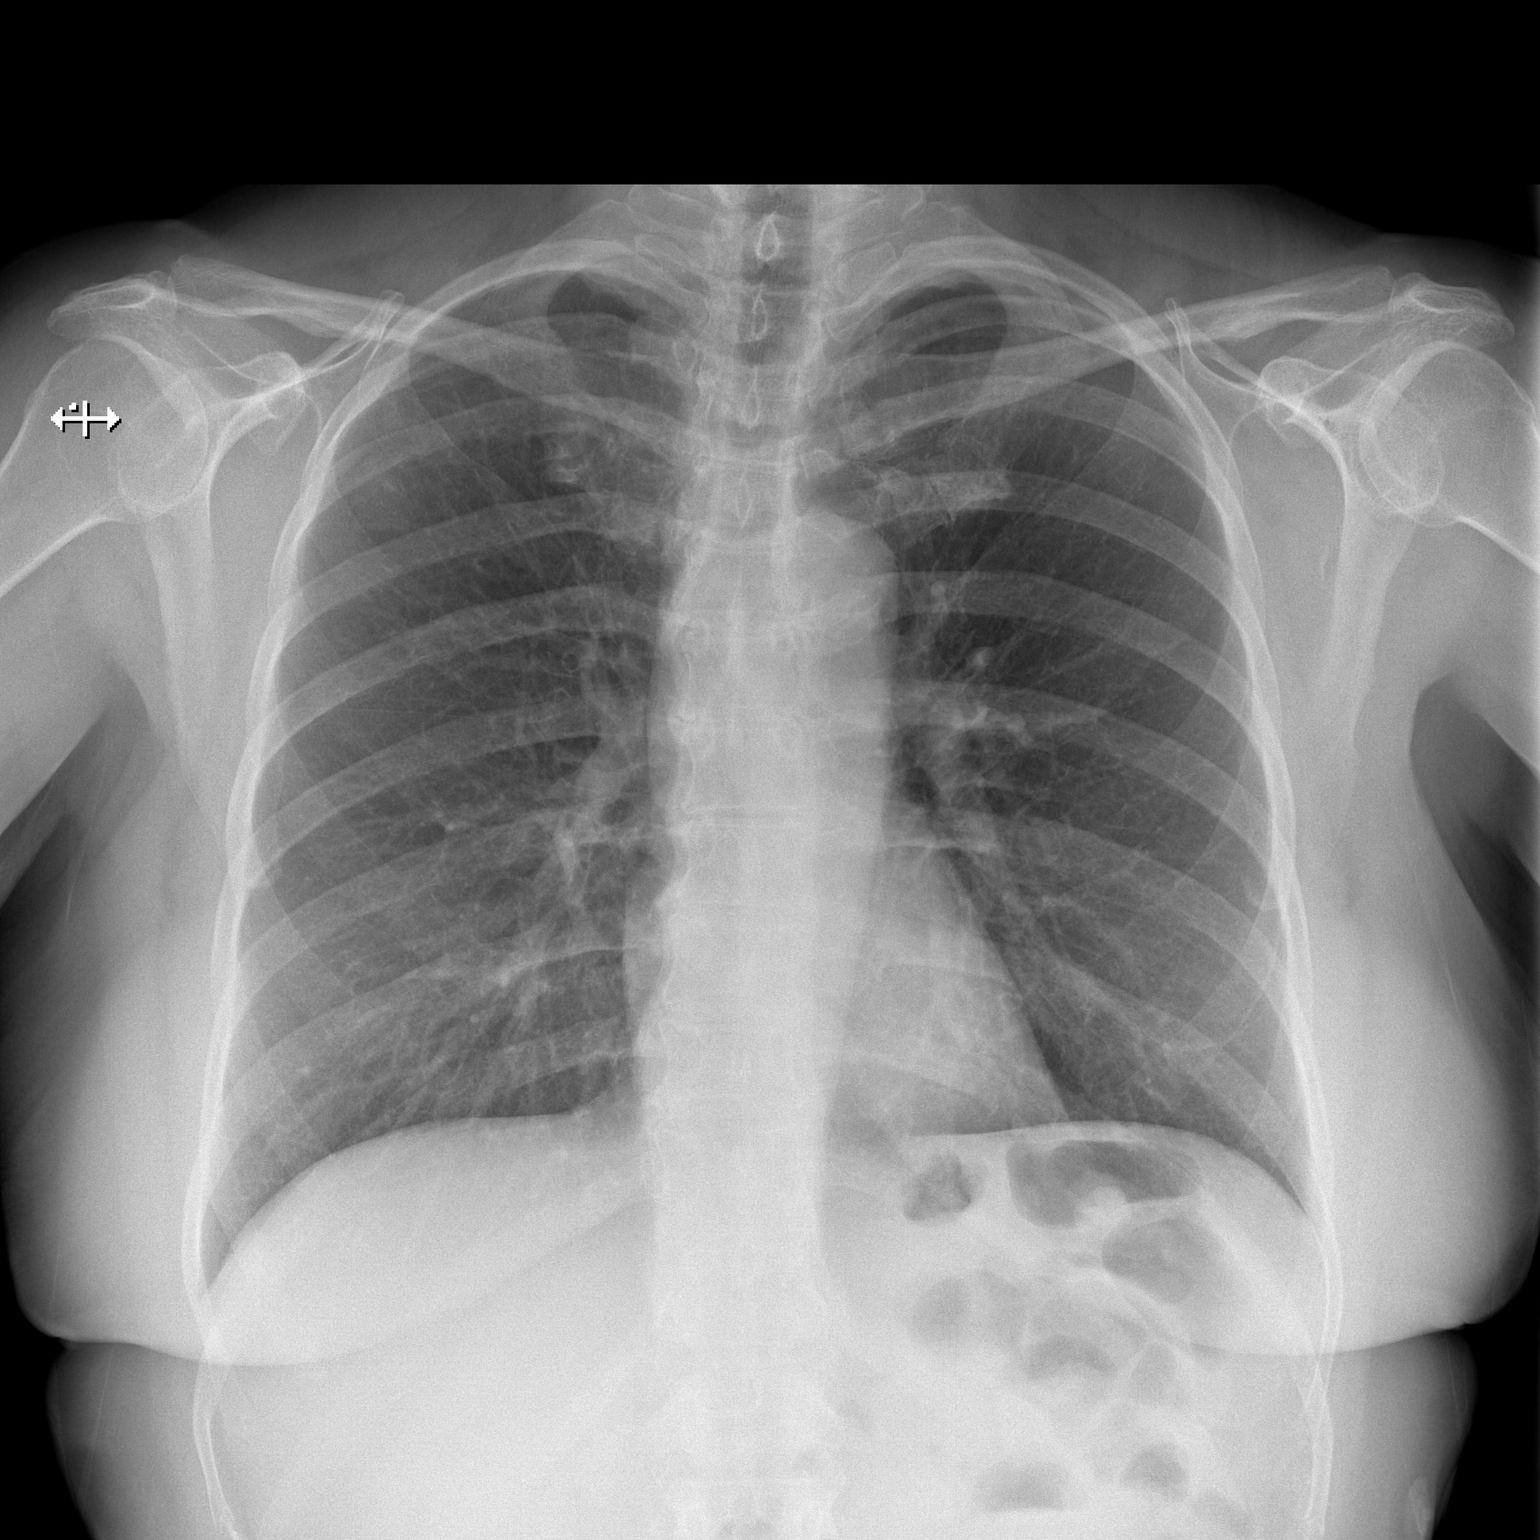

[w chest lat]
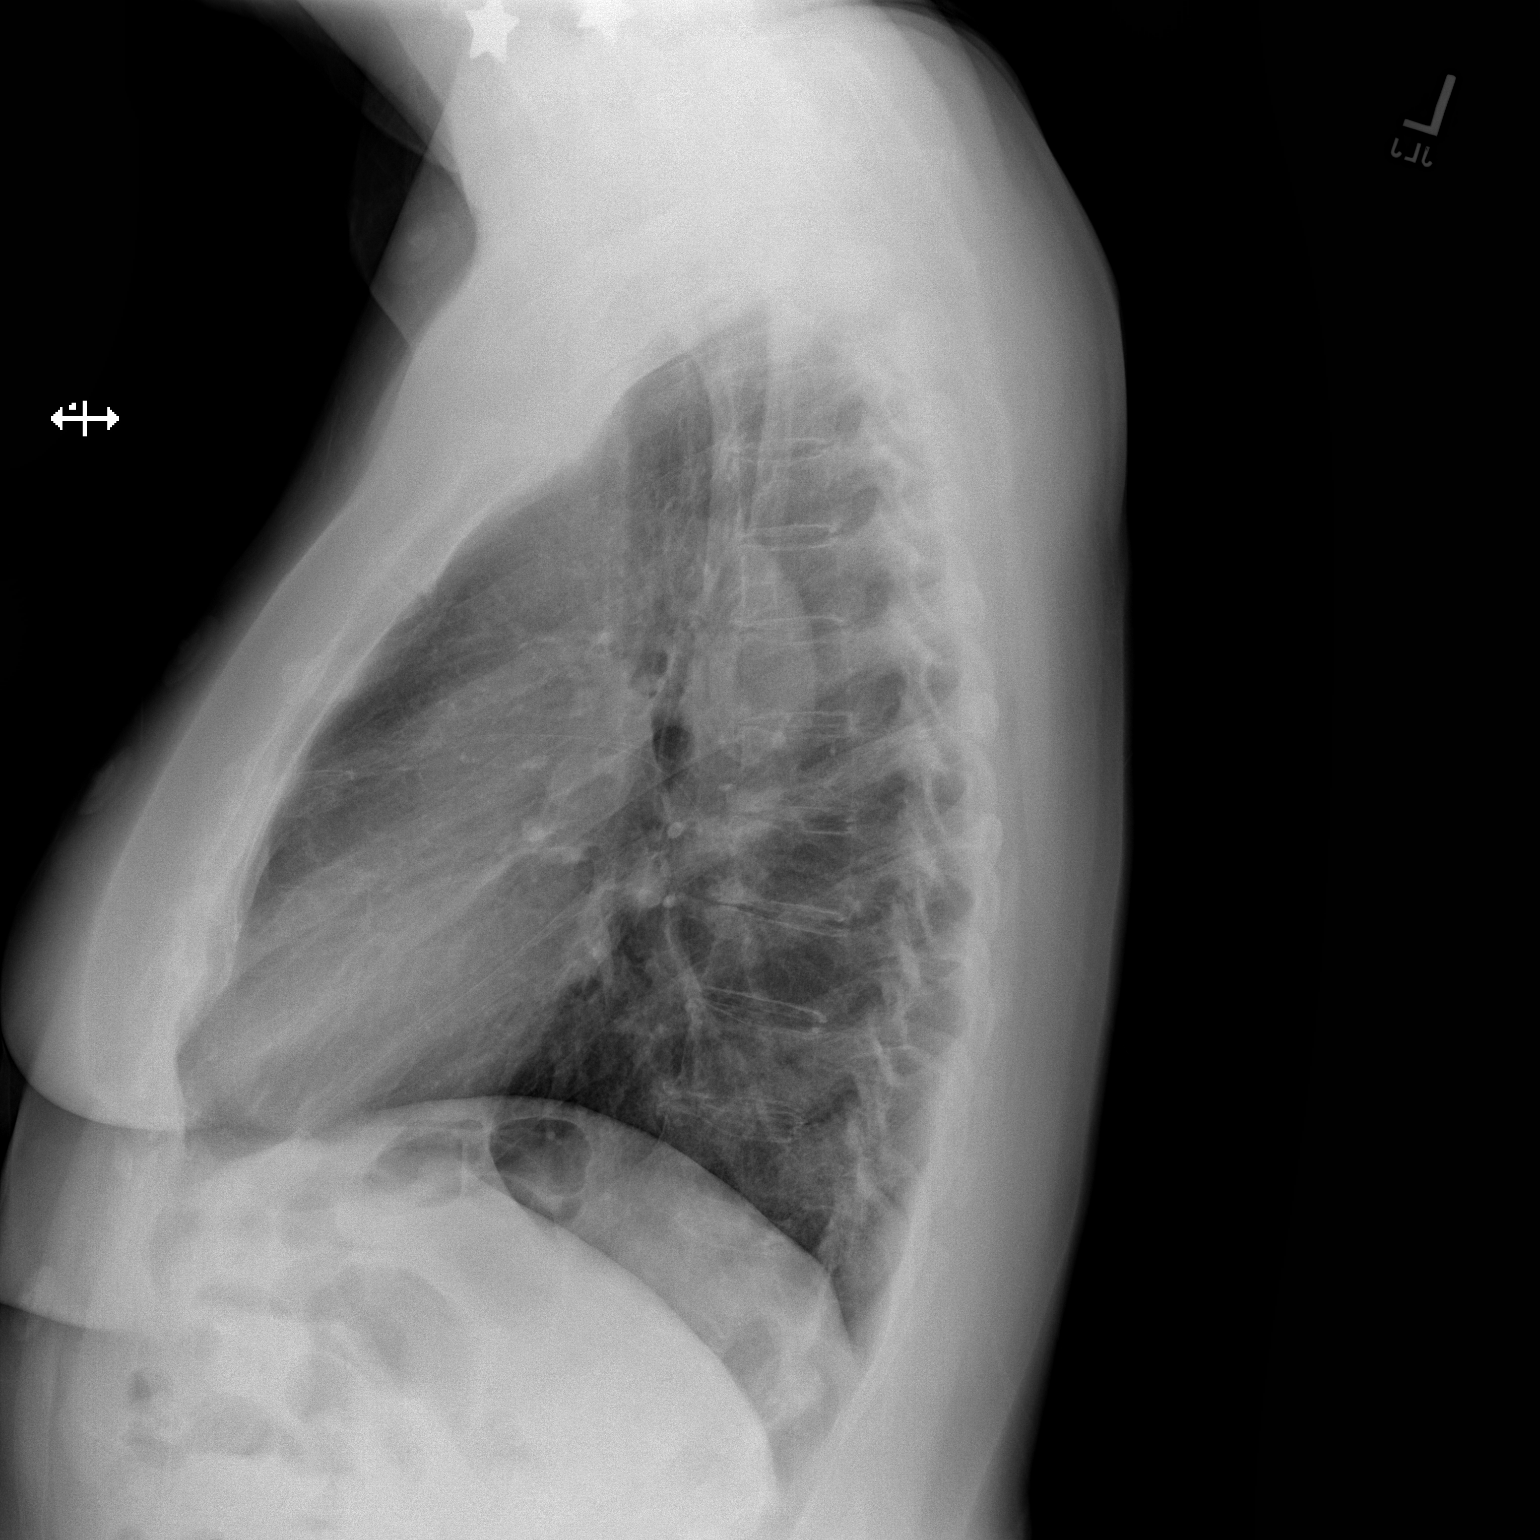

[2 of 2 positions shown; findings below may reference images not displayed]

FINDINGS: The cardiac and mediastinal silhouettes are within normal limits.
Aortic atherosclerosis.

The lungs are normally inflated. No airspace consolidation, pleural
effusion, or pulmonary edema is identified. There is no
pneumothorax.

No acute osseous abnormality identified.
IMPRESSION: 1. No active cardiopulmonary disease.
2. Aortic atherosclerosis.

## 2018-02-09 ENCOUNTER — Encounter: Payer: Self-pay | Admitting: *Deleted

## 2018-03-02 ENCOUNTER — Ambulatory Visit: Payer: Medicare Other | Admitting: Internal Medicine

## 2018-03-02 ENCOUNTER — Encounter: Payer: Self-pay | Admitting: Internal Medicine

## 2018-03-02 VITALS — BP 142/80 | HR 68 | Ht 65.25 in | Wt 181.6 lb

## 2018-03-02 DIAGNOSIS — K589 Irritable bowel syndrome without diarrhea: Secondary | ICD-10-CM

## 2018-03-02 DIAGNOSIS — K5700 Diverticulitis of small intestine with perforation and abscess without bleeding: Secondary | ICD-10-CM

## 2018-03-02 DIAGNOSIS — K648 Other hemorrhoids: Secondary | ICD-10-CM | POA: Diagnosis not present

## 2018-03-02 NOTE — Progress Notes (Signed)
Subjective:    Patient ID: Jodi [name], female    DOB: [birth year], 67 y.o.   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  She was last seen on 12/21/2017.  She is here alone today.  After her last visit for her chronic left-sided abdominal pain with intermittent nausea we decided to perform a CT scan of the abdomen pelvis.  I reviewed this test today and I reviewed it with her.  This showed jejunal diverticulitis which was noted off the jejunum left paramidline abdomen at level of umbilicus.  There was a 2.7 x 4.2 cm diverticulum with surrounding mesenteric haziness.  The remainder of the small bowel, appendix and colon were unremarkable.  She was treated with Augmentin 875 mg twice daily x7 days and Florastor 250 mg twice daily was added.  She reports that she improved tremendously with the Augmentin.  The abdominal pain has improved significantly.  She also has benefited from the [facility] with abdominal gas bloating and bowel regularity.  She still has some irritable bowel type symptom which is worse with foods like ice cream, cheeses, fried foods, bold coffees.  She is using Lactaid products which she has found to be helpful.  She will occasionally have some constipation and has noted some scant bleeding with wiping.  She is also noticed some hemorrhoidal prolapse.  Review of Systems As per HPI, otherwise negative  Current Medications, Allergies, Past Medical History, Past Surgical History, Family History and Social History were reviewed in [employer] record.      Objective:   Physical Exam BP (!) 142/80   Pulse 68   Ht 5' 5.25" (1.657 m)   Wt 181 lb 9.6 oz (82.4 kg)   LMP 07/06/2010   BMI 29.99 kg/m  Constitutional: Well-developed and well-nourished. No  distress. HEENT: Normocephalic and atraumatic.  No scleral icterus. Neck: Neck supple. Trachea midline. Cardiovascular: Normal rate, regular rhythm and intact distal pulses. No M/R/G Pulmonary/chest: Effort normal and breath sounds normal. No wheezing, rales or rhonchi. Abdominal: Soft, nontender, nondistended. Bowel sounds active throughout. There are no masses palpable. No hepatosplenomegaly. Rectal (performed with chaperone): External exam unremarkable except for small perianal skin tags most notable anteriorly, nontender exam, normal tone, hard stool in the rectal vault which is brown, small internal hemorrhoids, no masses. Extremities: no clubbing, cyanosis, or edema Neurological: Alert and oriented to person place and time. Skin: Skin is warm and dry. Psychiatric: Normal mood and affect. Behavior is normal.  CT ABDOMEN AND PELVIS WITHOUT CONTRAST   TECHNIQUE: Multidetector CT imaging of the abdomen and pelvis was performed following the standard protocol without IV contrast.   COMPARISON:  None.   FINDINGS: Lower chest: Lung bases show no acute findings. Heart size normal. No pericardial or pleural effusion.   Hepatobiliary: 11 mm low-attenuation lesion in the left hepatic lobe is too small to characterize. Cholecystectomy. No biliary ductal dilatation.   Pancreas: Negative.   Spleen: Negative.   Adrenals/Urinary Tract: Adrenal glands and right kidney are unremarkable. 12 mm low-attenuation lesion in the upper pole left kidney is difficult to further characterize without post-contrast imaging and due to size. Ureters are decompressed. Bladder is low in volume.   Stomach/Bowel: Stomach and majority of the small bowel are unremarkable. There is a diverticulum off the jejunum, in the left paramidline abdomen,  at the level of the umbilicus, measuring 2.7 x 4.2 cm. Mild surrounding mesenteric haziness. Remainder of the small bowel, appendix and colon are unremarkable.    Vascular/Lymphatic: Mild atherosclerotic calcification of the arterial vasculature without abdominal aortic aneurysm. No pathologically enlarged lymph nodes.   Reproductive: Uterus is visualized.  No adnexal mass.   Other: Trace pelvic free fluid. Mild mesenteric haziness adjacent to a jejunal diverticulum, as mentioned previously. Adjacent mesenteric lymph nodes measure up to 7 mm.   Musculoskeletal: Degenerative changes in the spine. No worrisome lytic or sclerotic lesions.   IMPRESSION: 1. 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 2.  Aortic atherosclerosis (ICD10-170.0).     Electronically Signed   By: Lorin Picket M.D.   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   1.  Jejunal diverticulitis --improved after treatment with Augmentin x7 days.  We spent time today discussing diverticulitis.  From a diet perspective I do not feel that she has to follow a nut free or seed free diet.  We discussed how there is no real data here.  I do think a high fiber diet would be more healthy for her.  I asked that she notify me if she develops recurrent and similar left-sided abdominal pain.  She voiced understanding.  2.  IBS --overall improved with the addition of Florastor.  She wishes to continue this therapy and made her aware that this is safe ongoing.  She can continue 250 mg once or twice daily.  She feels it twice daily works better.  I do recommend that she try the FODMAP diet which she has not yet tried to also help with her IBS symptoms.  3.  Hemorrhoids --prolapsing internal hemorrhoids with perianal skin tag.  We discussed hemorrhoidal banding if symptoms become troublesome.  She was given literature today.  4.  CRC screening  --negative Cologuard in 2017.  Repeat in 2020 versus colonoscopy.  6 month follow-up, sooner if needed 25 minutes spent with the patient today. Greater than 50% was spent in counseling and coordination of care with the patient

## 2018-03-02 NOTE — Patient Instructions (Addendum)
Continue Florastor 250mg  three times a day.  You have been given a [place]map diet to look over and follow.  Please follow up with [place] in 6 months (sooner if needed).  If you are age 67 or older, your body mass index should be between 23-30. Your Body mass index is 29.99 kg/m. If this is out of the aforementioned range listed, please consider follow up with your Primary Care Provider.  If you are age 3 or younger, your body mass index should be between 19-25. Your Body mass index is 29.99 kg/m. If this is out of the aformentioned range listed, please consider follow up with your Primary Care Provider.

## 2018-05-06 ENCOUNTER — Other Ambulatory Visit (HOSPITAL_COMMUNITY)
Admission: RE | Admit: 2018-05-06 | Discharge: 2018-05-06 | Disposition: A | Discharge status: Home / Self Care | Payer: Medicare Other | Source: Ambulatory Visit | Attending: Obstetrics & Gynecology | Admitting: Obstetrics & Gynecology

## 2018-05-06 ENCOUNTER — Ambulatory Visit: Payer: Medicare Other | Admitting: Obstetrics & Gynecology

## 2018-05-06 ENCOUNTER — Other Ambulatory Visit: Payer: Self-pay

## 2018-05-06 ENCOUNTER — Ambulatory Visit (INDEPENDENT_AMBULATORY_CARE_PROVIDER_SITE_OTHER): Payer: Medicare Other | Admitting: Obstetrics & Gynecology

## 2018-05-06 ENCOUNTER — Encounter: Payer: Self-pay | Admitting: Obstetrics & Gynecology

## 2018-05-06 VITALS — BP 138/70 | HR 92 | Resp 18 | Ht 65.25 in | Wt 183.4 lb

## 2018-05-06 DIAGNOSIS — Z01419 Encounter for gynecological examination (general) (routine) without abnormal findings: Secondary | ICD-10-CM | POA: Diagnosis not present

## 2018-05-06 DIAGNOSIS — Z124 Encounter for screening for malignant neoplasm of cervix: Secondary | ICD-10-CM | POA: Diagnosis present

## 2018-05-06 NOTE — Progress Notes (Signed)
67 y.o. G1P1 Married White or Caucasian female here for annual exam.  Had gallbladder removed 1/19.  Has been seeing Dr. Hilarie Fredrickson.  He was fine with her doing the cologuard.  Did have diverticulitis this year.    Denies vaginal bleeding.  Patient's last menstrual period was 07/06/2010.          Sexually active: No.  The current method of family planning is post menopausal status.    Exercising: Yes.    walking Smoker:  no  Health Maintenance: Pap:  03/02/17 Neg. HR HPV:Neg   02/24/16 Neg. HR HPV:neg  History of abnormal Pap:  Yes, LEEP  MMG:  09/11/16 BIRADS1:Neg  Colonoscopy:  04/08/16 Cologuard Neg  BMD:   09/11/16 Osteopenia  TDaP:  2019 Pneumonia vaccine(s):  done Shingrix:   Has 1 Hep C testing: 02/24/16 Neg  Screening Labs: PCP   reports that she quit smoking about 39 years ago. She has never used smokeless tobacco. She reports that she does not drink alcohol or use drugs.  Past Medical History:  Diagnosis Date  . Abnormal Pap smear of cervix   . Anxiety   . Aortic atherosclerosis ([place])   . Diverticulitis   . Fatty liver disease, nonalcoholic   . Glaucoma   . H/O dizziness   . History of depression    incest survivor/dysthymia  . Hypertension   . IBS (irritable bowel syndrome)    lactose intolerance  . Restless leg syndrome   . Sleep apnea   . Snoring 11/17/2012  . Thyroid disease    hypothyroid  . Varicose veins     Past Surgical History:  Procedure Laterality Date  . CHOLECYSTECTOMY    . COLPOSCOPY  99,00,02,04,06  . ENDOMETRIAL BIOPSY  2/09, 1/12   09-NEG, 2012-benign proliferative endometrium  . ENDOVENOUS ABLATION SAPHENOUS VEIN W/ LASER Right 04-08-2015   endovenous laser ablation (right greater saphenous vein) by Victorino Dike MD    . 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 hydrochlorothiazide (HYDRODIURIL) 25 MG tablet Take 25 mg by mouth daily.    Marland Kitchen levothyroxine (SYNTHROID, LEVOTHROID) 137 MCG tablet  Take 137 mcg by mouth daily before breakfast.    . Multiple Vitamin (MULTIVITAMIN WITH MINERALS) TABS Take 1 tablet by mouth daily.    Marland Kitchen saccharomyces boulardii (FLORASTOR) 250 MG capsule Take one tablet BID x 1 month 60 capsule 0  . timolol (TIMOPTIC) 0.5 % ophthalmic solution Place 1 drop into both eyes every morning.  3  . ALPRAZolam (XANAX) 0.25 MG tablet Take 0.25 mg by mouth as needed for anxiety.    Marland Kitchen EPINEPHrine 0.3 mg/0.3 mL IJ SOAJ injection use as directed by prescriber  0  . hydrocortisone (ANUSOL-HC) 2.5 % rectal cream Place 1 application rectally 2 (two) times daily. (Patient not taking: Reported on 05/06/2018) 30 g 0   No current facility-administered medications for this visit.     Family History  Problem Relation Age of Onset  . Hypertension Father   . Diabetes Father   . Dementia Father   . Cancer Father   . Hyperlipidemia Father   . Parkinson's disease Mother   . Dementia Mother   . Hypertension Mother   . Breast cancer Cousin        maternal side of family  . Dementia Brother     Review of Systems  HENT: Positive for congestion.   Gastrointestinal: Positive for diarrhea.  Genitourinary:  Night urination  Loss of urine with sneeze or cough   All other systems reviewed and are negative.   Exam:   BP 138/70 (BP Location: Right Arm, Patient Position: Sitting, Cuff Size: Large)   Pulse 92   Resp 18   Ht 5' 5.25" (1.657 m)   Wt 183 lb 6.4 oz (83.2 kg)   LMP 07/06/2010   BMI 30.29 kg/m    Height: 5' 5.25" (165.7 cm)  Ht Readings from Last 3 Encounters:  05/06/18 5' 5.25" (1.657 m)  03/02/18 5' 5.25" (1.657 m)  12/21/17 5' 5.25" (1.657 m)    General appearance: alert, cooperative and appears stated age Head: Normocephalic, without obvious abnormality, atraumatic Neck: no adenopathy, supple, symmetrical, trachea midline and thyroid normal to inspection and palpation Lungs: clear to auscultation bilaterally Breasts: normal appearance, no masses or  tenderness Heart: regular rate and rhythm Abdomen: soft, non-tender; bowel sounds normal; no masses,  no organomegaly Extremities: extremities normal, atraumatic, no cyanosis or edema Skin: Skin color, texture, turgor normal. No rashes or lesions Lymph nodes: Cervical, supraclavicular, and axillary nodes normal. No abnormal inguinal nodes palpated Neurologic: Grossly normal   Pelvic: External genitalia:  no lesions              Urethra:  normal appearing urethra with no masses, tenderness or lesions              Bartholins and Skenes: normal                 Vagina: normal appearing vagina with normal color and discharge, no lesions              Cervix: no lesions              Pap taken: Yes.   Bimanual Exam:  Uterus:  normal size, contour, position, consistency, mobility, non-tender              Adnexa: normal adnexa and no mass, fullness, tenderness               Rectovaginal: Confirms               Anus:  normal sphincter tone, no lesions  Chaperone was present for exam.  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  Doing 3D. pap smear obtained today Blood work done in march Vaccines UTD.  She is going to complete the Shingrix vaccination.  Aware should complete by 06/16/18 Will plan cologuard 04/2019. Return annually or prn

## 2018-05-09 LAB — CYTOLOGY - PAP: DIAGNOSIS: NEGATIVE

## 2018-08-01 ENCOUNTER — Encounter: Payer: Self-pay | Admitting: *Deleted

## 2018-08-10 ENCOUNTER — Ambulatory Visit: Payer: Medicare Other | Admitting: Internal Medicine

## 2018-08-30 ENCOUNTER — Telehealth: Payer: Self-pay | Admitting: *Deleted

## 2018-08-30 NOTE — Telephone Encounter (Signed)
Patient is having some bleeding she thinks may be coming from thinning skin.

## 2018-08-30 NOTE — Telephone Encounter (Signed)
Spoke with patient. Patient reports irritated and bleeding skin in perirectal area. Has been intermittent for the past year, notices more when hemorrhoids flare up. Denies vaginal bleeding or pain, requesting OV. OV scheduled for 2/27 at 1:45pm with Dr. Sabra Heck.   Routing to provider for final review. Patient is agreeable to disposition. Will close encounter.

## 2018-09-01 ENCOUNTER — Ambulatory Visit: Payer: Medicare Other | Admitting: Obstetrics & Gynecology

## 2018-09-01 ENCOUNTER — Encounter: Payer: Self-pay | Admitting: Obstetrics & Gynecology

## 2018-09-01 ENCOUNTER — Other Ambulatory Visit: Payer: Self-pay

## 2018-09-01 VITALS — BP 128/80 | HR 92 | Resp 16 | Ht 65.25 in | Wt 185.0 lb

## 2018-09-01 DIAGNOSIS — N8189 Other female genital prolapse: Secondary | ICD-10-CM | POA: Diagnosis not present

## 2018-09-01 DIAGNOSIS — N952 Postmenopausal atrophic vaginitis: Secondary | ICD-10-CM | POA: Diagnosis not present

## 2018-09-01 MED ORDER — NONFORMULARY OR COMPOUNDED ITEM: 3 refills | Status: DC

## 2018-09-01 NOTE — Progress Notes (Signed)
GYNECOLOGY  VISIT  CC:  Perirectal bleeding and irritation  HPI: 68 y.o. G1P1 Married White or Caucasian female here for complaint of more itching and irritation around the rectum.  Reports she has hemorrhoids.  When her hemorrhoids are more bothersome, she feels a specific area is also very irritated and there can be bleeding.  Uses OTC hydrocortisone cream on her hemorrhoids.  Has seen Dr. Hilarie Fredrickson and has discussed banding of the hemorrhoids.  She also reports some issues with pelvic floor weakness that she's noted with bowel and bladder function.  Would like to know if there is anything that can be done.  GYNECOLOGIC HISTORY: Patient's last menstrual period was 07/06/2010. Contraception: PMP Menopausal hormone therapy: none   Patient Active Problem List   Diagnosis Date Noted  . 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 Anxiety disorder 01/04/2015  . Essential (primary) hypertension 01/04/2015  . Hypothyroidism 01/04/2015  . Pure hypercholesterolemia 01/04/2015  . Right upper quadrant pain 01/01/2015  . Vertigo, labyrinthine 11/17/2012  . Snoring 11/17/2012    Past Medical History:  Diagnosis Date  . Abnormal Pap smear of cervix   . Anxiety   . Aortic atherosclerosis ([place])   . Diverticulitis   . Fatty liver disease, nonalcoholic   . Glaucoma   . H/O dizziness   . Hemorrhoids   . History of depression    incest survivor/dysthymia  . Hypertension   . IBS (irritable bowel syndrome)    lactose intolerance  . Restless leg syndrome   . Sleep apnea   . Snoring 11/17/2012  . Thyroid disease    hypothyroid  . Varicose veins     Past Surgical History:  Procedure Laterality Date  . CHOLECYSTECTOMY  07/19/2017   Dr. Donne Hazel  . COLPOSCOPY  99,00,02,04,06  . ENDOVENOUS ABLATION SAPHENOUS VEIN W/ LASER Right 04-08-2015   endovenous laser ablation (right greater saphenous vein) by Victorino Dike MD    . LEEP  3/09   CIN II/III     MEDS:   Current Outpatient Medications on File Prior to Visit  Medication Sig Dispense Refill  . ALPRAZolam (XANAX) 0.25 MG tablet Take 0.25 mg by mouth as needed for anxiety.    . hydrochlorothiazide (HYDRODIURIL) 25 MG tablet Take 25 mg by mouth daily.    . hydrocortisone (ANUSOL-HC) 2.5 % rectal cream Place 1 application rectally 2 (two) times daily. 30 g 0  . levothyroxine (SYNTHROID, LEVOTHROID) 137 MCG tablet Take 137 mcg by mouth daily before breakfast.    . Multiple Vitamin (MULTIVITAMIN WITH MINERALS) TABS Take 1 tablet by mouth daily.    Marland Kitchen saccharomyces boulardii (FLORASTOR) 250 MG capsule Take one tablet BID x 1 month 60 capsule 0  . timolol (TIMOPTIC) 0.5 % ophthalmic solution Place 1 drop into both eyes every morning.  3  . EPINEPHrine 0.3 mg/0.3 mL IJ SOAJ injection use as directed by prescriber  0   No current facility-administered medications on file prior to visit.     ALLERGIES: Sulfites; Iodine; Macrodantin [nitrofurantoin macrocrystal]; Shellfish allergy; Almond oil; and Bentyl [dicyclomine hcl]  Family History  Problem Relation Age of Onset  . Hypertension Father   . Diabetes Father   . Dementia Father   . Cancer Father   . Hyperlipidemia Father   . Parkinson's disease Mother   . Dementia Mother   . Hypertension Mother   . Breast cancer Cousin        maternal side of family  .  Dementia Brother     SH:  Married, non smoker  Review of Systems  All other systems reviewed and are negative.   PHYSICAL EXAMINATION:    BP 128/80 (BP Location: Left Arm, Patient Position: Sitting, Cuff Size: Large)   Pulse 92   Resp 16   Ht 5' 5.25" (1.657 m)   Wt 185 lb (83.9 kg)   LMP 07/06/2010   BMI 30.55 kg/m     General appearance: alert, cooperative and appears stated age Lymph:  no inguinal LAD noted  Pelvic: External genitalia:  Normal appearing external labia majora and minora, thin appearing tissue at perineal body              Urethra:  normal  appearing urethra with no masses, tenderness or lesions              Bartholins and Skenes: normal                 Vagina: atrophic vagina changes with normal color and discharge, no lesions              Chaperone was present for exam.  Assessment: Atrophic vaginal/vuvlar skin changes Pelvic floor weakness  Plan: Options discussed.  She is going to try vaginal Vit E suppositories 200u/ml.   Referral to pelvic PT at [facility].

## 2018-09-13 ENCOUNTER — Ambulatory Visit: Payer: Medicare Other | Admitting: Internal Medicine

## 2018-09-19 ENCOUNTER — Encounter: Payer: Self-pay | Admitting: Obstetrics & Gynecology

## 2018-10-03 ENCOUNTER — Telehealth: Payer: Self-pay | Admitting: Obstetrics and Gynecology

## 2018-10-03 NOTE — Telephone Encounter (Signed)
This is Dr. Quincy Simmonds reviewing Dr. Ammie Ferrier patient results while she is out of the office.   Her bone density results which showed osteopenia, which is early bone thinning but not osteoporosis.   Compared to 2018, she had a decrease in bone density of the right hip and the left hip and spine are stable.   According to the FRAX model, during the next 10 years, her risk of major osteoporotic fracture is 9.5% and her hip fracture risk is 1.2%.  The risk of fracture is not considered high enough to proceed with pharmacologic treatment.   1200 mg of calcium daily, 800 IU of vitamin D daily, and weight bearing exercise will help to maintain bone density and strength.  Her next bone density is due in 2 years.

## 2018-10-05 NOTE — Telephone Encounter (Signed)
Spoke with patient. Results given. Patient verbalizes understanding. Encounter closed. 

## 2018-10-27 ENCOUNTER — Encounter: Payer: Self-pay | Admitting: *Deleted

## 2018-10-31 ENCOUNTER — Encounter: Payer: Self-pay | Admitting: Internal Medicine

## 2018-10-31 ENCOUNTER — Other Ambulatory Visit: Payer: Self-pay

## 2018-10-31 ENCOUNTER — Ambulatory Visit (INDEPENDENT_AMBULATORY_CARE_PROVIDER_SITE_OTHER): Payer: Medicare Other | Admitting: Internal Medicine

## 2018-10-31 VITALS — Ht 65.5 in | Wt 185.0 lb

## 2018-10-31 DIAGNOSIS — G8929 Other chronic pain: Secondary | ICD-10-CM

## 2018-10-31 DIAGNOSIS — K589 Irritable bowel syndrome without diarrhea: Secondary | ICD-10-CM

## 2018-10-31 DIAGNOSIS — Z1211 Encounter for screening for malignant neoplasm of colon: Secondary | ICD-10-CM

## 2018-10-31 DIAGNOSIS — R1032 Left lower quadrant pain: Secondary | ICD-10-CM

## 2018-10-31 DIAGNOSIS — K648 Other hemorrhoids: Secondary | ICD-10-CM

## 2018-10-31 NOTE — Progress Notes (Signed)
Subjective:    Patient ID: Jodi [name], female    DOB: January 20, 1951, 68 y.o.   MRN: [id number]  This service was provided via telemedicine.  Telephone encounter The patient was located at home The provider was located in GI office  The patient did consent to this telephone visit and is aware of possible charges through their insurance for this visit.   The other persons participating in this telemedicine service were the patient and Time spent on call:  19 min   HPI Jodi [name] is a 68 year old female with a past medical history of jejunal diverticulitis, history of chronic left-sided abdominal pain and IBS, prior cholecystectomy and hemorrhoids who is seen virtually for follow-up.  She was last seen in the office on 03/02/2018.  She reports that she has been doing well.  She has not had any further pain or flare of what she would consider to be her jejunal diverticulitis.  This was always a pain that was higher in her abdomen.  She has remained on Florastor 1-2 times a day which she has found helpful.  She feels that her IBS discomfort which is predominantly left-sided and chronic has improved significantly.  She finds it better when she avoids dairy products even lactose-free dairy products.  Foods that are particularly sweet with high sugar content also seem to worsen the symptoms.  She has been eating more salads and fruits salads particularly in the COVID-19 quarantine and also this is helped her symptoms.  Previously Metamucil seem to aggravate her symptoms.  Anxiety also worsens her loose stool and she has been able to link this and since becoming aware of this has actually gotten better.  She has had some hemorrhoidal symptoms and feels that she has one hemorrhoid that particularly acts up.  It can intermittently bleed very minor bleeding and itching.  Comes and goes.  Interested in hemorrhoidal banding.  She had a negative Cologuard in October 2017.   Review of Systems As per HPI,  otherwise negative  Current Medications, Allergies, Past Medical History, Past Surgical History, Family History and Social History were reviewed in [employer] record.     Objective:   Physical Exam Not performed, virtual visit      Assessment & Plan:   68 year old female with a past medical history of jejunal diverticulitis, history of chronic left-sided abdominal pain and IBS, prior cholecystectomy and hemorrhoids who is seen virtually for follow-up.    1.  IBS with left-sided abdominal pain--doing very well with high-fiber diet focused on fruits and vegetables.  Did not respond favorably to Metamucil.  Florastor 1-2 times a day seems to help with her IBS symptoms. --Continue diet rich in salads and fruits --Florastor 250 mg 1-2 times a day  2.  History of jejunal diverticulitis --prior treatment with Augmentin with resolution.  3.  Hemorrhoids --intermittent prolapsing and scant bleeding internal hemorrhoid.  We discussed hemorrhoidal banding.  She remains interested.  Would like to defer this for now in the setting of COVID-19 but will call back for an office visit.  Would plan an anoscopy followed by banding if appropriate.  She can use the over-the-counter hemorrhoidal remedies per box instruction but knows that this will only be temporary relief and that symptoms may flare until definitive therapy.  4.  CRC screening --negative Cologuard in October 2017.  We discussed repeat Cologuard versus colonoscopy today.  She prefers to repeat Cologuard which I will order in October 2020  55-month follow-up  for the office, sooner if needed or sooner if she wishes to pursue hemorrhoidal banding before routine follow-up

## 2018-11-01 ENCOUNTER — Ambulatory Visit: Payer: Medicare Other | Admitting: Internal Medicine

## 2018-11-01 NOTE — Patient Instructions (Addendum)
Continue a diet rich in salads and fruits.  Take Florastor 250 mg 1-2 times a day (over the counter).  Please call back at your convenience to set up hemorrhoidal banding when you feel comfortable doing so. Until that time, you may use over the counter hemorrhoidal remedies per box instructions (although it would only be for temporary symptomatic relief)  You will be due for repeat Cologuard testing in October 2020. We will discuss this with you again when it gets closer to that time.  Please follow up in the office with Dr Hilarie Fredrickson in 34-month; sooner if needed if she wishes to pursue hemorrhoidal banding before routine follow-up.

## 2019-03-28 ENCOUNTER — Telehealth: Payer: Self-pay | Admitting: *Deleted

## 2019-03-28 NOTE — Telephone Encounter (Signed)
-----   Message from Larina Bras, [place] sent at 11/01/2018 12:24 PM EDT ----- Needs cologuard October 2020 ... see 10/31/18 televisit note

## 2019-03-28 NOTE — Telephone Encounter (Signed)
Colo guard order has been sent to [facility] as patient has agreed to go forward with testing.

## 2019-05-03 ENCOUNTER — Other Ambulatory Visit: Payer: Self-pay

## 2019-05-03 LAB — COLOGUARD: Cologuard: NEGATIVE

## 2019-05-04 ENCOUNTER — Other Ambulatory Visit: Payer: Self-pay

## 2019-05-08 ENCOUNTER — Other Ambulatory Visit: Payer: Self-pay

## 2019-05-08 ENCOUNTER — Encounter: Payer: Self-pay | Admitting: Obstetrics & Gynecology

## 2019-05-08 ENCOUNTER — Other Ambulatory Visit (HOSPITAL_COMMUNITY)
Admission: RE | Admit: 2019-05-08 | Discharge: 2019-05-08 | Disposition: A | Discharge status: Home / Self Care | Payer: Medicare Other | Source: Ambulatory Visit | Attending: Obstetrics & Gynecology | Admitting: Obstetrics & Gynecology

## 2019-05-08 ENCOUNTER — Ambulatory Visit (INDEPENDENT_AMBULATORY_CARE_PROVIDER_SITE_OTHER): Payer: Medicare Other | Admitting: Obstetrics & Gynecology

## 2019-05-08 VITALS — BP 140/80 | HR 78 | Temp 97.2°F | Ht 65.0 in | Wt 191.0 lb

## 2019-05-08 DIAGNOSIS — Z124 Encounter for screening for malignant neoplasm of cervix: Secondary | ICD-10-CM | POA: Insufficient documentation

## 2019-05-08 DIAGNOSIS — Z01419 Encounter for gynecological examination (general) (routine) without abnormal findings: Secondary | ICD-10-CM

## 2019-05-08 NOTE — Progress Notes (Signed)
68 y.o. G1P1 Married White or Caucasian female here for annual exam.  Doing well.  Denies vaginal bleeding.  She started using the Vit E vaginal suppositories in march but stopped due to Covid.  Wants to know if can start now.  Desires to go to pelvic PT but desires to do this in the spring.    Patient's last menstrual period was 07/06/2010.          Sexually active: No.  The current method of family planning is post menopausal status.    Exercising: Yes.   Smoker:  no   Health Maintenance: Pap: 11/19 with  LEEP  MMG:  09/19/18 BIRADS1:Neg  Colonoscopy:  04/08/16 Cologuard Neg.  04/24/2019.  Negative.   BMD:   09/19/18: Osteopenia  TDaP:  09/29/2017 Pneumonia vaccine(s):  done Flu: done Shingrix: done Hep C testing: 02/24/16 Neg  Screening Labs: PCP   reports that she quit smoking about 40 years ago. She has never used smokeless tobacco. She reports previous alcohol use. She reports that she does not use drugs.  Past Medical History:  Diagnosis Date  . Abnormal Pap smear of cervix   . Anxiety   . Aortic atherosclerosis ([place])   . Diverticulitis   . Fatty liver disease, nonalcoholic   . Glaucoma   . H/O dizziness   . Hemorrhoids   . History of depression    incest survivor/dysthymia  . Hypertension   . Hyperthyroidism   . IBS (irritable bowel syndrome)    lactose intolerance  . Restless leg syndrome   . Sleep apnea   . Snoring 11/17/2012  . Thyroid disease    hypothyroid  . Varicose veins     Past Surgical History:  Procedure Laterality Date  . CHOLECYSTECTOMY  07/19/2017   Dr. Donne Hazel  . COLPOSCOPY  99,00,02,04,06  . ENDOVENOUS ABLATION SAPHENOUS VEIN W/ LASER Right 04-08-2015   endovenous laser ablation (right greater saphenous vein) by Victorino Dike MD    . 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 ALPRAZolam (XANAX) 0.25 MG tablet Take 0.25 mg by mouth as needed for anxiety.    Marland Kitchen EPINEPHrine 0.3 mg/0.3 mL IJ  SOAJ injection use as directed by prescriber  0  . fluticasone (FLONASE) 50 MCG/ACT nasal spray SMARTSIG:1-2 Spray(s) Both Nares Daily    . hydrochlorothiazide (HYDRODIURIL) 25 MG tablet Take 25 mg by mouth daily.    . hydrocortisone (ANUSOL-HC) 2.5 % rectal cream Place 1 application rectally 2 (two) times daily. 30 g 0  . levothyroxine (SYNTHROID, LEVOTHROID) 137 MCG tablet Take 137 mcg by mouth daily before breakfast.    . Multiple Vitamin (MULTIVITAMIN WITH MINERALS) TABS Take 1 tablet by mouth daily.    . NONFORMULARY OR COMPOUNDED ITEM Vitamin E vaginal suppositories 200u/ml.  One pv three times weekly. 36 each 3  . saccharomyces boulardii (FLORASTOR) 250 MG capsule Take one tablet BID x 1 month (Patient taking differently: Take one tablet BID) 60 capsule 0  . timolol (TIMOPTIC) 0.5 % ophthalmic solution Place 1 drop into both eyes every morning.  3  . triamcinolone cream (KENALOG) 0.1 % APP EXT AA BID FOR 14 DAYS     No current facility-administered medications for this visit.     Family History  Problem Relation Age of Onset  . Hypertension Father   . Diabetes Father   . Dementia Father   . Hyperlipidemia Father   . Prostate cancer Father   .  Colon polyps Father   . Parkinson's disease Mother   . Dementia Mother   . Hypertension Mother   . Breast cancer Cousin        maternal side of family  . Dementia Brother   . Colon polyps Brother   . Colon cancer Neg Hx   . Esophageal cancer Neg Hx   . Stomach cancer Neg Hx   . Liver disease Neg Hx   . Pancreatic cancer Neg Hx     Review of Systems  All other systems reviewed and are negative.   Exam:   BP (!) 145/95   Pulse 78   Temp (!) 97.2 F (36.2 C) (Temporal)   Ht 5\' 5"  (1.651 m)   Wt 191 lb (86.6 kg)   LMP 07/06/2010   BMI 31.78 kg/m   Height: 5\' 5"  (165.1 cm)   Recheck BP:  140/80  Ht Readings from Last 3 Encounters:  05/08/19 5\' 5"  (1.651 m)  10/31/18 5' 5.5" (1.664 m)  10/27/18 5' 5.5" (1.664 m)     General appearance: alert, cooperative and appears stated age Head: Normocephalic, without obvious abnormality, atraumatic Neck: no adenopathy, supple, symmetrical, trachea midline and thyroid normal to inspection and palpation Lungs: clear to auscultation bilaterally Breasts: normal appearance, no masses or tenderness Heart: regular rate and rhythm Abdomen: soft, non-tender; bowel sounds normal; no masses,  no organomegaly Extremities: extremities normal, atraumatic, no cyanosis or edema Skin: Skin color, texture, turgor normal. No rashes or lesions Lymph nodes: Cervical, supraclavicular, and axillary nodes normal. No abnormal inguinal nodes palpated Neurologic: Grossly normal   Pelvic: External genitalia:  no lesions              Urethra:  normal appearing urethra with no masses, tenderness or lesions              Bartholins and Skenes: normal                 Vagina: normal appearing vagina with normal color and discharge, no lesions              Cervix: no lesions              Pap taken: Yes.   Bimanual Exam:  Uterus:  normal size, contour, position, consistency, mobility, non-tender              Adnexa: normal adnexa and no mass, fullness, tenderness               Rectovaginal: Confirms               Anus:  normal sphincter tone, no lesions  Chaperone was present for exam.  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  Doing 3D. pap smear obtained today.  Pt desires yearly pap smear.  Guidelines reviewed. Blood work done with PCP Vaccines reviewed Declines colonoscopy.  Cologuard neg 2017 and 2020. Return annually or prn

## 2019-05-11 ENCOUNTER — Other Ambulatory Visit: Payer: Self-pay

## 2019-05-11 ENCOUNTER — Encounter: Payer: Self-pay | Admitting: Internal Medicine

## 2019-05-11 ENCOUNTER — Ambulatory Visit (INDEPENDENT_AMBULATORY_CARE_PROVIDER_SITE_OTHER): Payer: Medicare Other | Admitting: Internal Medicine

## 2019-05-11 VITALS — Ht 65.25 in | Wt 190.0 lb

## 2019-05-11 DIAGNOSIS — R1032 Left lower quadrant pain: Secondary | ICD-10-CM

## 2019-05-11 DIAGNOSIS — G8929 Other chronic pain: Secondary | ICD-10-CM

## 2019-05-11 DIAGNOSIS — K648 Other hemorrhoids: Secondary | ICD-10-CM | POA: Diagnosis not present

## 2019-05-11 DIAGNOSIS — K589 Irritable bowel syndrome without diarrhea: Secondary | ICD-10-CM

## 2019-05-11 LAB — CYTOLOGY - PAP: Diagnosis: NEGATIVE

## 2019-05-11 NOTE — Patient Instructions (Signed)
Continue a high-fiber diet with fresh fruits and vegetables.  Continue Florastor 250 mg once daily.  Continue Lactaid with any lactose foods and avoid other trigger foods such as coffee.    Please Avoid all NSAID's. Some examples of NSAID's are as follows: Aspirin (Bufferin, Bayer, and Excedrin) Ibuprofen (Advil, Motrin, Nuprin) Ketoprofen (Actron, Orudis) Naproxen (Aleve) Daypro  Indocin  Lodine  Naprosyn  Relafen  Vimovo Voltaren  Continue Anusol cream on an as-needed basis.  Do not use this continuously.   Please follow up with Dr Hilarie Fredrickson in 37-months; sooner if needed.  If you are age 69 or older, your body mass index should be between 23-30. Your Body mass index is 31.38 kg/m. If this is out of the aforementioned range listed, please consider follow up with your Primary Care Provider.  If you are age 73 or younger, your body mass index should be between 19-25. Your Body mass index is 31.38 kg/m. If this is out of the aformentioned range listed, please consider follow up with your Primary Care Provider.

## 2019-05-11 NOTE — Progress Notes (Signed)
Subjective:    Patient ID: Jodi [name], female    DOB: 1950-12-02, 68 y.o.   MRN: [id number]  This service was provided via telemedicine.  Doximity application with A/V communication, changed to telephone after several minutes with intermittent slow communication/perception The patient was located at home The provider was located in provider's GI office. The patient did consent to this telephone visit and is aware of possible charges through their insurance for this visit.   The persons participating in this telemedicine service were the patient and I. Time spent on call: 15 minutes   HPI Jodi [name] is a 68 year old female with a history of jejunal diverticulitis, history of IBS with chronic left-sided abdominal discomfort, history of hemorrhoids, prior cholecystectomy who is seen for follow-up.  She is seen virtually today in setting of COVID-19 and was last seen on 10/31/2018.  She reports that from an IBS standpoint she has been doing much better over the last several months.  Her lower and left abdominal pain is better but when it bothers her she uses Pepto-Bismol which helps.  This also helps intermittent loose stools.  She has become very aware that dairy specifically lactose is a trigger for her symptoms and thus she avoids lactose.  If she does have a lactose-containing food or beverage she will use Lactaid with success.  She also is avoiding other aggravating symptoms particularly for her diverticulitis type symptoms which include avoiding NSAIDs specifically Motrin.  Also avoiding coffee which aggravates her symptoms.  For the most part her bowel movements have been regular and helped by [facility] which she uses once a day.  Hemorrhoids continue to be troublesome for her intermittently with prolapse and occasional red blood with wiping.  She had considered hemorrhoidal banding but during the pandemic prefers to minimize in office appointments.  She is using Anusol cream on occasion  which helps.  She performed a repeat Cologuard last month which was again negative   Review of Systems As per HPI, otherwise negative  Current Medications, Allergies, Past Medical History, Past Surgical History, Family History and Social History were reviewed in [employer] record.     Objective:   Physical Exam Ht 5' 5.25" (1.657 m)   Wt 190 lb (86.2 kg)   LMP 07/06/2010   BMI 31.38 kg/m  No physical exam today, virtual visit    Assessment & Plan:   68 year old female with a history of jejunal diverticulitis, history of IBS with chronic left-sided abdominal discomfort, history of hemorrhoids, prior cholecystectomy who is seen for follow-up.  1.  IBS with left-sided abdominal pain --she is doing very well with dietary modification and avoiding certain trigger foods and medications.  She is also benefited from daily probiotic.  She is fully aware of the association of stress and anxiety with her IBS and just knowledge of this has caused her to be more relaxed and have less symptoms overall. --Continue high-fiber diet with fresh fruits and vegetables --Continue Florastor 250 mg once daily --Continue Lactaid with any lactose foods and avoiding other trigger foods such as coffee.  Avoid NSAIDs  2.  History of jejunal diverticulitis --no recent flare.  Was treated successfully with Augmentin in the past for this issue.  3.  Hemorrhoids --intermittent prolapsing and scant bleeding.  And continue the Anusol cream on an as-needed basis.  She knows to not use this continuously.  Hemorrhoidal banding continues to be the most likely best therapy for definitive treatment.  We can discuss  this when she is comfortable with an office visit.  4.  CRC screening --negative Cologuard in October 2017 in October 2020 --Repeat Cologuard in 3 years, versus another screening modality such as colonoscopy at that time based on her preference  49-month follow-up, sooner if she needs

## 2019-07-20 ENCOUNTER — Ambulatory Visit: Payer: Medicare Other | Attending: Internal Medicine

## 2019-07-20 DIAGNOSIS — Z20822 Contact with and (suspected) exposure to covid-19: Secondary | ICD-10-CM

## 2019-07-21 LAB — NOVEL CORONAVIRUS, NAA: SARS-CoV-2, NAA: NOT DETECTED

## 2019-07-26 ENCOUNTER — Ambulatory Visit: Payer: Medicare Other | Attending: Internal Medicine

## 2019-07-26 DIAGNOSIS — Z23 Encounter for immunization: Secondary | ICD-10-CM | POA: Insufficient documentation

## 2019-07-26 NOTE — Progress Notes (Signed)
   Covid-19 Vaccination Clinic  Name:  Jodi [name]    MRN: [id number] DOB: [birth year]  07/26/2019  Ms. Jodi [name] was observed post Covid-19 immunization for 15 minutes without incidence. She was provided with Vaccine Information Sheet and instruction to access the V-Safe system.   Jodi [name] was instructed to call 911 with any severe reactions post vaccine: Marland Kitchen Difficulty breathing  . Swelling of your face and throat  . A fast heartbeat  . A bad rash all over your body  . Dizziness and weakness    Immunizations Administered    Name Date Dose VIS Date Route   Pfizer COVID-19 Vaccine 07/26/2019 11:42 AM 0.3 mL 06/16/2019 Intramuscular   Manufacturer: [place]   Lot: [id number]   [place]: [id number]

## 2019-08-16 ENCOUNTER — Ambulatory Visit: Payer: Medicare Other | Attending: Internal Medicine

## 2019-08-16 DIAGNOSIS — Z23 Encounter for immunization: Secondary | ICD-10-CM | POA: Insufficient documentation

## 2019-08-16 NOTE — Progress Notes (Signed)
   Covid-19 Vaccination Clinic  Name:  Jodi [name]    MRN: [id number] DOB: 12-Feb-1951  08/16/2019  [name] was observed post Covid-19 immunization for 15 minutes without incidence. She was provided with Vaccine Information Sheet and instruction to access the V-Safe system.   [name] was instructed to call 911 with any severe reactions post vaccine: Marland Kitchen Difficulty breathing  . Swelling of your face and throat  . A fast heartbeat  . A bad rash all over your body  . Dizziness and weakness    Immunizations Administered    Name Date Dose VIS Date Route   Pfizer COVID-19 Vaccine 08/16/2019 10:18 AM 0.3 mL 06/16/2019 Intramuscular   Manufacturer: [employer]   Lot: [id number]   [place]: [id number]

## 2019-08-28 ENCOUNTER — Ambulatory Visit: Payer: Medicare Other

## 2019-10-09 ENCOUNTER — Telehealth: Payer: Self-pay | Admitting: Obstetrics & Gynecology

## 2019-10-09 NOTE — Telephone Encounter (Signed)
Patient is requesting a new prescription vitamin e vaginal suppository. [place].

## 2019-10-10 NOTE — Telephone Encounter (Signed)
Spoke with patient, advised per Dr. Sabra Heck. Patient agreeable to vaginal cream vs suppository, send to [facility].   OV scheduled for 01/09/20 at 4pm with Dr. Sabra Heck.   Advised patient [place] will notify once Rx ready, this typically takes at least 48 hrs. Patient verbalizes understanding and is agreeable.   Dr. Sabra Heck  -please advise on Rx.

## 2019-10-10 NOTE — Telephone Encounter (Signed)
She should get a new prescription and discard the ones she has that are over a year old.  I can change this to a cream that can be used internally and externally if she wants.  If she is ok that that, let me know and  I will send rx.  I should probably plan a follow up in 2-3 months after she's been using the Vit E cream for recheck.  Thanks.

## 2019-10-10 NOTE — Telephone Encounter (Signed)
Spoke with patient. Patient reports vaginal atrophy, was prescribed RX vitamin E vag suppositories 08/2018, never started them, was concerned to start new med during Covid19 pandemic. Patient reports occasional spotting, denies any other GYN symptoms. Patient reports Hx of hemorrhoids, feels like this increases vaginal irritation when wiping.  Patient states symptoms "are the same as before", would like to now try Vit E vaginal suppositories. She picked up the RX in 08/2018, stored medication in the refrigerator, asking if ok to use?   Last AEX 05/08/19  Advised patient I will review with Dr. Sabra Heck and return call. Patient agreeable.   Dr. Sabra Heck -please review and advise.

## 2019-10-11 MED ORDER — NONFORMULARY OR COMPOUNDED ITEM: 3 refills | Status: DC

## 2019-10-11 NOTE — Telephone Encounter (Signed)
Patient notified. Encounter closed

## 2019-10-11 NOTE — Telephone Encounter (Signed)
Order for rx written and signed.  Will be faxed to [facility] by Terence Lux, CMA.  Ok to close encounter.

## 2019-10-12 ENCOUNTER — Encounter (HOSPITAL_COMMUNITY): Payer: Medicare Other

## 2019-10-29 ENCOUNTER — Other Ambulatory Visit: Payer: Self-pay

## 2019-10-29 ENCOUNTER — Emergency Department (HOSPITAL_COMMUNITY)
Admission: EM | Admit: 2019-10-29 | Discharge: 2019-10-29 | Disposition: A | Discharge status: Home / Self Care | Payer: Medicare Other | Attending: Emergency Medicine | Admitting: Emergency Medicine

## 2019-10-29 ENCOUNTER — Encounter (HOSPITAL_COMMUNITY): Payer: Self-pay

## 2019-10-29 DIAGNOSIS — R002 Palpitations: Secondary | ICD-10-CM | POA: Insufficient documentation

## 2019-10-29 DIAGNOSIS — F419 Anxiety disorder, unspecified: Secondary | ICD-10-CM | POA: Insufficient documentation

## 2019-10-29 DIAGNOSIS — Z79899 Other long term (current) drug therapy: Secondary | ICD-10-CM | POA: Insufficient documentation

## 2019-10-29 DIAGNOSIS — E039 Hypothyroidism, unspecified: Secondary | ICD-10-CM | POA: Insufficient documentation

## 2019-10-29 DIAGNOSIS — Z87891 Personal history of nicotine dependence: Secondary | ICD-10-CM | POA: Diagnosis not present

## 2019-10-29 DIAGNOSIS — I10 Essential (primary) hypertension: Secondary | ICD-10-CM | POA: Diagnosis not present

## 2019-10-29 NOTE — ED Provider Notes (Signed)
[facility] DEPT Provider Note   CSN: [id number] Arrival date & time: 10/29/19  0118     History Chief Complaint  Patient presents with  . Anxiety    Jodi [name] is a 69 y.o. female.  HPI Patient presents with palpitations and anxiety.  States began last night.  Had a weighted around 7 hours in the waiting room to be seen by me.  States that she felt her heart racing and became more anxious.  States she took some of her Xanax that she has as needed and states it is feeling better now.  States she had gone to bed and woke up with a racing.  No chest pain.  No lightheadedness dizziness.  Does have history of anxiety.  No known cardiac disease.  States she is nervous because she has a dental extraction that is going to be done on Tuesday.  No weight loss.  I had a PCP annual visit a couple weeks ago and states her thyroid was going a little high at that time.  Patient is feeling much better now still mild anxiety.    Past Medical History:  Diagnosis Date  . Abnormal Pap smear of cervix   . Anxiety   . Aortic atherosclerosis ([place])   . Diverticulitis   . Fatty liver disease, nonalcoholic   . Glaucoma   . H/O dizziness   . Hemorrhoids   . History of depression    incest survivor/dysthymia  . Hypertension   . Hyperthyroidism   . IBS (irritable bowel syndrome)    lactose intolerance  . Restless leg syndrome   . Sleep apnea   . Snoring 11/17/2012  . Thyroid disease    hypothyroid  . Varicose veins     Patient Active Problem List   Diagnosis Date Noted  . 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 Anxiety disorder 01/04/2015  . Essential (primary) hypertension 01/04/2015  . Hypothyroidism 01/04/2015  . Pure hypercholesterolemia 01/04/2015  . Right upper quadrant pain 01/01/2015  . Vertigo, labyrinthine 11/17/2012  . Snoring 11/17/2012    Past Surgical History:  Procedure Laterality Date  .  CHOLECYSTECTOMY  07/19/2017   Dr. Donne Hazel  . COLPOSCOPY  99,00,02,04,06  . ENDOVENOUS ABLATION SAPHENOUS VEIN W/ LASER Right 04-08-2015   endovenous laser ablation (right greater saphenous vein) by Victorino Dike MD    . LEEP  3/09   CIN II/III     OB History    Gravida  1   Para  1   Term      Preterm      AB      Living  1     SAB      TAB      Ectopic      Multiple      Live Births              Family History  Problem Relation Age of Onset  . Hypertension Father   . Diabetes Father   . Dementia Father   . Hyperlipidemia Father   . Prostate cancer Father   . Colon polyps Father   . Parkinson's disease Mother   . Dementia Mother   . Hypertension Mother   . Breast cancer Cousin        maternal side of family  . Dementia Brother   . Colon polyps Brother   . Colon cancer Neg Hx   . Esophageal cancer Neg Hx   .  Stomach cancer Neg Hx   . Liver disease Neg Hx   . Pancreatic cancer Neg Hx     Social History   Tobacco Use  . Smoking status: Former Smoker    Quit date: 09/21/1978    Years since quitting: 41.1  . Smokeless tobacco: Never Used  . Tobacco comment: quit 30 years ago  Substance Use Topics  . Alcohol use: Not Currently    Alcohol/week: 0.0 standard drinks  . Drug use: No    Home Medications Prior to Admission medications   Medication Sig Start Date End Date Taking? Authorizing Provider  ALPRAZolam Duanne Moron) 0.25 MG tablet Take 0.25 mg by mouth as needed for anxiety.   Yes [provider]  Bismuth Subsalicylate (PEPTO-BISMOL PO) Take by mouth as needed.   Yes [provider]  EPINEPHrine 0.3 mg/0.3 mL IJ SOAJ injection Inject 0.3 mg into the muscle as needed for anaphylaxis.  02/21/16  Yes [provider]  fluticasone (FLONASE) 50 MCG/ACT nasal spray Place 2 sprays into both nostrils daily as needed for allergies.  04/17/19  Yes [provider]  Garlic 10 MG CAPS Take 10 mg by mouth daily.   Yes [provider]  hydrochlorothiazide (HYDRODIURIL) 25 MG tablet Take 25 mg by mouth daily.   Yes [provider]  hydrocortisone (ANUSOL-HC) 2.5 % rectal cream Place 1 application rectally 2 (two) times daily. Patient taking differently: Place 1 application rectally 2 (two) times daily as needed for hemorrhoids or anal itching.  06/27/15  Yes Megan Salon, MD  lactase (LACTAID) 3000 units tablet Take 3,000 Units by mouth as needed (lactaid).    Yes [provider]  levothyroxine (SYNTHROID, LEVOTHROID) 137 MCG tablet Take 137 mcg by mouth daily before breakfast.   Yes [provider]  Multiple Vitamin (MULTIVITAMIN WITH MINERALS) TABS Take 1 tablet by mouth daily.   Yes [provider]  OVER THE COUNTER MEDICATION Take 1 capsule by mouth daily. Beet Root extract   Yes [provider]  PROAIR RESPICLICK 123XX123 (90 Base) MCG/ACT AEPB Inhale 1 puff into the lungs every 4 (four) hours as needed for wheezing or shortness of breath. 10/27/19  Yes [provider]  saccharomyces boulardii (FLORASTOR) 250 MG capsule Take one tablet BID x 1 month Patient taking differently: Take 250 mg by mouth daily.  12/29/17  Yes Pyrtle, Lajuan Lines, MD  timolol (TIMOPTIC) 0.5 % ophthalmic solution Place 1 drop into both eyes every morning. 12/16/17  Yes [provider]  Turmeric (QC TUMERIC COMPLEX PO) Take 1 capsule by mouth daily.   Yes [provider]  NONFORMULARY OR COMPOUNDED ITEM Vitamin E vaginal cream 200u/ml.  Apply 21ml vaginally and small amount externally two to three times weekly. 10/11/19   Megan Salon, MD    Allergies    Sulfites, Iodine, Macrodantin [nitrofurantoin macrocrystal], Shellfish allergy, Almond oil, and Bentyl [dicyclomine hcl]  Review of Systems   Review of Systems  Constitutional: Negative for appetite change.  HENT: Negative for congestion.   Respiratory: Negative for shortness of breath.   Cardiovascular: Positive for  palpitations. Negative for chest pain.  Gastrointestinal: Negative for abdominal distention.  Genitourinary: Negative for flank pain.  Musculoskeletal: Negative for back pain.  Skin: Negative for rash.  Neurological: Negative for weakness.  Psychiatric/Behavioral: The patient is nervous/anxious.     Physical Exam Updated Vital Signs BP (!) 181/90 (BP Location: Right Arm)   Pulse 94   Temp 98 F (36.7 C) (  Oral)   Resp 18   Ht 5\' 5"  (1.651 m)   Wt 88.5 kg   LMP 07/06/2010   SpO2 97%   BMI 32.45 kg/m   Physical Exam Vitals and nursing note reviewed.  HENT:     Head: Normocephalic.  Eyes:     Pupils: Pupils are equal, round, and reactive to light.  Cardiovascular:     Rate and Rhythm: Normal rate and regular rhythm.  Pulmonary:     Effort: Pulmonary effort is normal.     Breath sounds: Normal breath sounds.  Abdominal:     Tenderness: There is no abdominal tenderness.  Musculoskeletal:     Right lower leg: No edema.     Left lower leg: No edema.  Skin:    General: Skin is warm.     Capillary Refill: Capillary refill takes less than 2 seconds.  Neurological:     Mental Status: She is alert and oriented to person, place, and time.     ED Results / Procedures / Treatments   Labs (all labs ordered are listed, but only abnormal results are displayed) Labs Reviewed - No data to display  EKG EKG Interpretation  Date/Time:  Sunday October 29 2019 08:21:06 EDT Ventricular Rate:  89 PR Interval:    QRS Duration: 89 QT Interval:  373 QTC Calculation: [id number] Axis:   77 Text Interpretation: Sinus rhythm Consider right atrial enlargement Confirmed by Davonna Belling [phone]) on 10/29/2019 8:30:03 AM   Radiology No results found.  Procedures Procedures (including critical care time)  Medications Ordered in ED Medications - No data to display  ED Course  I have reviewed the triage vital signs and the nursing notes.  Pertinent labs & imaging results that were  available during my care of the patient were reviewed by me and considered in my medical decision making (see chart for details).    MDM Rules/Calculators/A&P                      Patient presents with anxiety and palpitations.  EKG done and reviewed by me and reassuring.  Heart rate improved.  Somewhat hypertensive but states she has a case of whitecoat hypertension.  Think this is likely anxiety.  Feeling much better.  Will discharge home with outpatient follow-up.  Pulmonary embolism coronary artery disease severe arrhythmia pneumothorax pneumonia anemia all felt less likely. Final Clinical Impression(s) / ED Diagnoses Final diagnoses:  Anxiety  Palpitations    Rx / DC Orders ED Discharge Orders    None       Davonna Belling, MD 10/29/19 [phone]

## 2019-10-29 NOTE — ED Triage Notes (Signed)
C/o palpations. Sts her anxiety is acting up. Took xanax and it did not calm down.

## 2019-11-01 ENCOUNTER — Other Ambulatory Visit: Payer: Self-pay | Admitting: *Deleted

## 2019-11-01 DIAGNOSIS — I83893 Varicose veins of bilateral lower extremities with other complications: Secondary | ICD-10-CM

## 2019-11-06 ENCOUNTER — Ambulatory Visit: Payer: Medicare Other | Admitting: Internal Medicine

## 2019-11-06 ENCOUNTER — Encounter (HOSPITAL_COMMUNITY): Payer: Medicare Other

## 2019-11-22 ENCOUNTER — Ambulatory Visit (HOSPITAL_COMMUNITY)
Admission: RE | Admit: 2019-11-22 | Discharge: 2019-11-22 | Disposition: A | Discharge status: Home / Self Care | Payer: Medicare Other | Source: Ambulatory Visit | Attending: Surgery | Admitting: Surgery

## 2019-11-22 ENCOUNTER — Other Ambulatory Visit: Payer: Self-pay

## 2019-11-22 DIAGNOSIS — I83893 Varicose veins of bilateral lower extremities with other complications: Secondary | ICD-10-CM

## 2019-11-23 ENCOUNTER — Ambulatory Visit: Payer: Medicare Other | Admitting: Physician Assistant

## 2019-11-23 DIAGNOSIS — I781 Nevus, non-neoplastic: Secondary | ICD-10-CM

## 2019-11-23 DIAGNOSIS — R600 Localized edema: Secondary | ICD-10-CM

## 2019-11-23 NOTE — Progress Notes (Signed)
Office Note     CC:  follow up Requesting Provider:  Carol Ada, MD  HPI: Jodi [name] is a 69 y.o. ([birth year]) female who returns to clinic due to spider veins and bilateral lower extremity edema.  She has history of staged greater saphenous vein ablations by Dr. Kellie Simmering in 2016.  She is also had several sclerotherapy treatments performed at this office.  She states that she has been noticing more spider veins of bilateral lower extremities and would like to inquire about further sclerotherapy.  She has also noticed an increase in edema however has not been wearing compression stockings.  She denies any diagnosis of DVT, venous ulcerations, or other tissue changes since last office visit.  She denies tobacco use.   Past Medical History:  Diagnosis Date  . Abnormal Pap smear of cervix   . Anxiety   . Aortic atherosclerosis ([place])   . Diverticulitis   . Fatty liver disease, nonalcoholic   . Glaucoma   . H/O dizziness   . Hemorrhoids   . History of depression    incest survivor/dysthymia  . Hypertension   . Hyperthyroidism   . IBS (irritable bowel syndrome)    lactose intolerance  . Restless leg syndrome   . Sleep apnea   . Snoring 11/17/2012  . Thyroid disease    hypothyroid  . Varicose veins     Past Surgical History:  Procedure Laterality Date  . CHOLECYSTECTOMY  07/19/2017   Dr. Donne Hazel  . COLPOSCOPY  99,00,02,04,06  . ENDOVENOUS ABLATION SAPHENOUS VEIN W/ LASER Right 04-08-2015   endovenous laser ablation (right greater saphenous vein) by Victorino Dike MD    . LEEP  3/09   CIN II/III    Social History   Socioeconomic History  . Marital status: Married    Spouse name: Not on file  . Number of children: 1  . Years of education: Not on file  . Highest education level: Not on file  Occupational History  . Not on file  Tobacco Use  . Smoking status: Former Smoker    Quit date: 09/21/1978    Years since quitting: 41.2  . Smokeless tobacco: Never Used  .  Tobacco comment: quit 30 years ago  Substance and Sexual Activity  . Alcohol use: Not Currently    Alcohol/week: 0.0 standard drinks  . Drug use: No  . Sexual activity: Not Currently    Partners: Male  Other Topics Concern  . Not on file  Social History Narrative  . Not on file   Social Determinants of Health   Financial Resource Strain:   . Difficulty of Paying Living Expenses:   Food Insecurity:   . Worried About Charity fundraiser in the Last Year:   . Arboriculturist in the Last Year:   Transportation Needs:   . Film/video editor (Medical):   Marland Kitchen Lack of Transportation (Non-Medical):   Physical Activity:   . Days of Exercise per Week:   . Minutes of Exercise per Session:   Stress:   . Feeling of Stress :   Social Connections:   . Frequency of Communication with Friends and Family:   . Frequency of Social Gatherings with Friends and Family:   . Attends Religious Services:   . Active Member of Clubs or Organizations:   . Attends Archivist Meetings:   Marland Kitchen Marital Status:   Intimate Partner Violence:   . Fear of Current or Ex-Partner:   . Emotionally Abused:   .  Physically Abused:   . Sexually Abused:     Family History  Problem Relation Age of Onset  . Hypertension Father   . Diabetes Father   . Dementia Father   . Hyperlipidemia Father   . Prostate cancer Father   . Colon polyps Father   . Parkinson's disease Mother   . Dementia Mother   . Hypertension Mother   . Breast cancer Cousin        maternal side of family  . Dementia Brother   . Colon polyps Brother   . Colon cancer Neg Hx   . Esophageal cancer Neg Hx   . Stomach cancer Neg Hx   . Liver disease Neg Hx   . Pancreatic cancer Neg Hx     Current Outpatient Medications  Medication Sig Dispense Refill  . ALPRAZolam (XANAX) 0.25 MG tablet Take 0.25 mg by mouth as needed for anxiety.    . Bismuth Subsalicylate (PEPTO-BISMOL PO) Take by mouth as needed.    Marland Kitchen EPINEPHrine 0.3 mg/0.3 mL  IJ SOAJ injection Inject 0.3 mg into the muscle as needed for anaphylaxis.   0  . fluticasone (FLONASE) 50 MCG/ACT nasal spray Place 2 sprays into both nostrils daily as needed for allergies.     . Garlic 10 MG CAPS Take 10 mg by mouth daily.    . hydrochlorothiazide (HYDRODIURIL) 25 MG tablet Take 25 mg by mouth daily.    . hydrocortisone (ANUSOL-HC) 2.5 % rectal cream Place 1 application rectally 2 (two) times daily. (Patient taking differently: Place 1 application rectally 2 (two) times daily as needed for hemorrhoids or anal itching. ) 30 g 0  . lactase (LACTAID) 3000 units tablet Take 3,000 Units by mouth as needed (lactaid).     Marland Kitchen levothyroxine (SYNTHROID, LEVOTHROID) 137 MCG tablet Take 137 mcg by mouth daily before breakfast.    . Multiple Vitamin (MULTIVITAMIN WITH MINERALS) TABS Take 1 tablet by mouth daily.    . NONFORMULARY OR COMPOUNDED ITEM Vitamin E vaginal cream 200u/ml.  Apply 68ml vaginally and small amount externally two to three times weekly. 36 each 3  . OVER THE COUNTER MEDICATION Take 1 capsule by mouth daily. Beet Root extract    . PROAIR RESPICLICK 123XX123 (90 Base) MCG/ACT AEPB Inhale 1 puff into the lungs every 4 (four) hours as needed for wheezing or shortness of breath.    . saccharomyces boulardii (FLORASTOR) 250 MG capsule Take one tablet BID x 1 month (Patient taking differently: Take 250 mg by mouth daily. ) 60 capsule 0  . timolol (TIMOPTIC) 0.5 % ophthalmic solution Place 1 drop into both eyes every morning.  3  . Turmeric (QC TUMERIC COMPLEX PO) Take 1 capsule by mouth daily.     No current facility-administered medications for this visit.    Allergies  Allergen Reactions  . Sulfites Anaphylaxis  . Iodine     Was told to place this as an allergy along with sulfites as she had almost anaphylactic reaction to shellfish years ago.  Clancy Gourd [Nitrofurantoin Macrocrystal] Nausea Only and Other (See Comments)    Headache  . Shellfish Allergy   . Almond Oil  Itching and Rash    ALL TREE NUTS  . Bentyl [Dicyclomine Hcl] Anxiety    Patient noticed anxiety with this medication.     REVIEW OF SYSTEMS:   [X]  denotes positive finding, [ ]  denotes negative finding Cardiac  Comments:  Chest pain or chest pressure:    Shortness of breath upon  exertion:    Short of breath when lying flat:    Irregular heart rhythm:        Vascular    Pain in calf, thigh, or hip brought on by ambulation:    Pain in feet at night that wakes you up from your sleep:     Blood clot in your veins:    Leg swelling:         Pulmonary    Oxygen at home:    Productive cough:     Wheezing:         Neurologic    Sudden weakness in arms or legs:     Sudden numbness in arms or legs:     Sudden onset of difficulty speaking or slurred speech:    Temporary loss of vision in one eye:     Problems with dizziness:         Gastrointestinal    Blood in stool:     Vomited blood:         Genitourinary    Burning when urinating:     Blood in urine:        Psychiatric    Major depression:         Hematologic    Bleeding problems:    Problems with blood clotting too easily:        Skin    Rashes or ulcers:        Constitutional    Fever or chills:      PHYSICAL EXAMINATION:  Vitals:   11/23/19 1028  BP: (!) 144/86  Pulse: 84  Resp: 16  Temp: (!) 97.4 F (36.3 C)  TempSrc: Temporal  SpO2: 96%  Weight: 192 lb 9.6 oz (87.4 kg)  Height: 5\' 5"  (1.651 m)    General:  WDWN in NAD; vital signs documented above Gait: Not observed HENT: WNL, normocephalic Pulmonary: normal non-labored breathing , without Rales, rhonchi,  wheezing Cardiac: regular HR Abdomen: soft, NT, no masses Skin: without rashes Vascular Exam/Pulses:  Right Left  Radial 2+ (normal) 2+ (normal)  DP 2+ (normal) 2+ (normal)   Extremities: No venous ulcers or other tissue changes; mild to moderate edema noted below the knee bilateral lower extremities; scattered spider veins especially  near her ankles and her right knee Musculoskeletal: no muscle wasting or atrophy  Neurologic: A&O X 3;  No focal weakness or paresthesias are detected Psychiatric:  The pt has Normal affect.   Non-Invasive Vascular Imaging:   Negative for DVT bilateral lower extremities Right greater saphenous vein consistent with prior ablation Left greater saphenous vein consistent with prior ablation Competent small saphenous vein bilaterally  ASSESSMENT/PLAN:: 69 y.o. female here for evaluation of BLE edema and spider veins  - Venous reflux duplex consistent with successful prior greater saphenous vein ablations bilaterally - I recommended that patient return to wearing her thigh-high compression stockings daily - I also encouraged her to elevate her legs when possible during the day - She will also follow up with Izora Gala for evaluation of possible sclerotherapy   Dagoberto Ligas, PA-C [facility] [phone]  Clinic MD:   Oneida Alar

## 2019-11-30 ENCOUNTER — Encounter: Payer: Medicare Other | Admitting: Vascular Surgery

## 2019-11-30 ENCOUNTER — Encounter (HOSPITAL_COMMUNITY): Payer: Medicare Other

## 2019-12-05 ENCOUNTER — Ambulatory Visit: Payer: Medicare Other

## 2019-12-06 ENCOUNTER — Ambulatory Visit: Payer: Medicare Other

## 2019-12-18 ENCOUNTER — Ambulatory Visit: Payer: Medicare Other | Admitting: Internal Medicine

## 2019-12-22 ENCOUNTER — Telehealth: Payer: Self-pay | Admitting: Obstetrics & Gynecology

## 2019-12-22 NOTE — Telephone Encounter (Signed)
Spoke with patient. Patient reports irritation around clitoris for the past 2 days. Mild itching. Denies vaginal d/c, odor or bleeding. Has been applying vitamin E cream, no change in symptoms. Denies use of any new products. Requesting OV.   OV scheduled for 6/22 at 12:30pm with Dr. Sabra Heck.   Routing to provider for final review. Patient is agreeable to disposition. Will close encounter.

## 2019-12-22 NOTE — Telephone Encounter (Signed)
Patient would like appointment with Dr.Miller for some irritation she has been having. She stated this is not urgent.

## 2019-12-25 NOTE — Progress Notes (Signed)
GYNECOLOGY  VISIT  CC:   Patient complains having itching and dryness around clitoris. Per patient, feeling better today due to using more of the Vitamin E vaginal cream. Patient states that she has occasional burning after urination at the urethra.  HPI: 69 y.o. G1P1 Married White or Caucasian female here for external irritation.  She feels like her clitoris doesn't "feel right".  She's not sure if it feels swollen but it tender and irritated.  She also has noted some itchiness.  She has started using the vit E cream externally for the past few days and this has helped.  Denies vaginal discharge or vaginal bleeding.  She does have some occasional urethral burning as well.  Denies dysuria.    She has hx of hemorrhoids.  Has seen Dr. Hilarie Fredrickson for possible banding.  Would like topical steroid cream if possible.  GYNECOLOGIC HISTORY: Patient's last menstrual period was 07/06/2010. Contraception: Postmenopausal Menopausal hormone therapy: Vitamin E vaginal cream  Patient Active Problem List   Diagnosis Date Noted  . Spider veins 11/23/2019  . Edema of both legs 11/23/2019  . 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 Anxiety disorder 01/04/2015  . Essential (primary) hypertension 01/04/2015  . Hypothyroidism 01/04/2015  . Pure hypercholesterolemia 01/04/2015  . Right upper quadrant pain 01/01/2015  . Vertigo, labyrinthine 11/17/2012  . Snoring 11/17/2012    Past Medical History:  Diagnosis Date  . Abnormal Pap smear of cervix   . Anxiety   . Aortic atherosclerosis ([place])   . Diverticulitis   . Fatty liver disease, nonalcoholic   . Glaucoma   . H/O dizziness   . Hemorrhoids   . History of depression    incest survivor/dysthymia  . Hypertension   . Hyperthyroidism   . IBS (irritable bowel syndrome)    lactose intolerance  . Restless leg syndrome   . Sleep apnea   . Snoring 11/17/2012  . Thyroid disease    hypothyroid  . Varicose  veins     Past Surgical History:  Procedure Laterality Date  . CHOLECYSTECTOMY  07/19/2017   Dr. Donne Hazel  . COLPOSCOPY  99,00,02,04,06  . ENDOVENOUS ABLATION SAPHENOUS VEIN W/ LASER Right 04-08-2015   endovenous laser ablation (right greater saphenous vein) by Victorino Dike MD    . LEEP  3/09   CIN II/III    MEDS:   Current Outpatient Medications on File Prior to Visit  Medication Sig Dispense Refill  . ALPRAZolam (XANAX) 0.25 MG tablet Take 0.25 mg by mouth as needed for anxiety.    . Bismuth Subsalicylate (PEPTO-BISMOL PO) Take by mouth as needed.    Marland Kitchen EPINEPHrine 0.3 mg/0.3 mL IJ SOAJ injection Inject 0.3 mg into the muscle as needed for anaphylaxis.   0  . fluticasone (FLONASE) 50 MCG/ACT nasal spray Place 2 sprays into both nostrils daily as needed for allergies.     . hydrochlorothiazide (HYDRODIURIL) 25 MG tablet Take 25 mg by mouth daily.    . hydrocortisone (ANUSOL-HC) 2.5 % rectal cream Place 1 application rectally 2 (two) times daily. (Patient taking differently: Place 1 application rectally 2 (two) times daily as needed for hemorrhoids or anal itching. ) 30 g 0  . lactase (LACTAID) 3000 units tablet Take 3,000 Units by mouth as needed (lactaid).     Marland Kitchen levothyroxine (SYNTHROID) 112 MCG tablet Take 112 mcg by mouth daily.    . Multiple Vitamin (MULTIVITAMIN WITH MINERALS) TABS Take 1 tablet by mouth daily.    Marland Kitchen  NONFORMULARY OR COMPOUNDED ITEM Vitamin E vaginal cream 200u/ml.  Apply 66ml vaginally and small amount externally two to three times weekly. 36 each 3  . PROAIR RESPICLICK 326 (90 Base) MCG/ACT AEPB Inhale 1 puff into the lungs every 4 (four) hours as needed for wheezing or shortness of breath.    . saccharomyces boulardii (FLORASTOR) 250 MG capsule Take one tablet BID x 1 month (Patient taking differently: Take 250 mg by mouth daily. ) 60 capsule 0  . timolol (TIMOPTIC) 0.5 % ophthalmic solution Place 1 drop into both eyes every morning.  3   No current  facility-administered medications on file prior to visit.    ALLERGIES: Sulfites, Iodine, Macrodantin [nitrofurantoin macrocrystal], Shellfish allergy, Almond oil, and Bentyl [dicyclomine hcl]  Family History  Problem Relation Age of Onset  . Hypertension Father   . Diabetes Father   . Dementia Father   . Hyperlipidemia Father   . Prostate cancer Father   . Colon polyps Father   . Parkinson's disease Mother   . Dementia Mother   . Hypertension Mother   . Breast cancer Cousin        maternal side of family  . Dementia Brother   . Colon polyps Brother   . Colon cancer Neg Hx   . Esophageal cancer Neg Hx   . Stomach cancer Neg Hx   . Liver disease Neg Hx   . Pancreatic cancer Neg Hx     SH:  Married, non smoker  Review of Systems  All other systems reviewed and are negative.   PHYSICAL EXAMINATION:    BP (!) 142/78 (BP Location: Right Arm, Patient Position: Sitting, Cuff Size: Normal)   Pulse 84   Temp (!) 97.3 F (36.3 C) (Temporal)   Ht 5\' 5"  (1.651 m)   Wt 191 lb (86.6 kg)   LMP 07/06/2010   BMI 31.78 kg/m     General appearance: alert, cooperative and appears stated age Lymph:  no inguinal LAD noted  Pelvic: External genitalia:  no lesions              Urethra:  normal appearing urethra with no masses, tenderness or lesions              Bartholins and Skenes: normal                 Vagina: normal appearing vagina with normal color and discharge, no lesions  Assessment: Vulvar irritation, likely due to atrophy Hemorrhoids   Plan: Affirm pending She is going to continue to use the vit E cream.  Does not need Rx right now. Rx for Anusol HC 2.5% cream bid for up to 10 days.  About 25 minutes total spent with pt.

## 2019-12-26 ENCOUNTER — Ambulatory Visit: Payer: Medicare Other | Admitting: Obstetrics & Gynecology

## 2019-12-26 ENCOUNTER — Other Ambulatory Visit: Payer: Self-pay

## 2019-12-26 ENCOUNTER — Encounter: Payer: Self-pay | Admitting: Obstetrics & Gynecology

## 2019-12-26 VITALS — BP 142/78 | HR 84 | Temp 97.3°F | Ht 65.0 in | Wt 191.0 lb

## 2019-12-26 DIAGNOSIS — N9089 Other specified noninflammatory disorders of vulva and perineum: Secondary | ICD-10-CM

## 2019-12-26 MED ORDER — HYDROCORTISONE (PERIANAL) 2.5 % EX CREA
TOPICAL_CREAM | Freq: Two times a day (BID) | CUTANEOUS | 1 refills | Status: DC
Start: 2019-12-26 — End: 2020-03-28

## 2019-12-27 LAB — VAGINITIS/VAGINOSIS, DNA PROBE
Candida Species: NEGATIVE
Gardnerella vaginalis: NEGATIVE
Trichomonas vaginosis: NEGATIVE

## 2020-01-09 ENCOUNTER — Ambulatory Visit: Payer: Self-pay | Admitting: Obstetrics & Gynecology

## 2020-02-05 ENCOUNTER — Ambulatory Visit: Payer: Medicare Other | Admitting: Internal Medicine

## 2020-02-05 ENCOUNTER — Encounter: Payer: Self-pay | Admitting: Internal Medicine

## 2020-02-05 VITALS — BP 110/72 | HR 98 | Ht 65.5 in | Wt 191.0 lb

## 2020-02-05 DIAGNOSIS — G8929 Other chronic pain: Secondary | ICD-10-CM

## 2020-02-05 DIAGNOSIS — K648 Other hemorrhoids: Secondary | ICD-10-CM | POA: Diagnosis not present

## 2020-02-05 DIAGNOSIS — Z8719 Personal history of other diseases of the digestive system: Secondary | ICD-10-CM

## 2020-02-05 DIAGNOSIS — R1032 Left lower quadrant pain: Secondary | ICD-10-CM

## 2020-02-05 DIAGNOSIS — K589 Irritable bowel syndrome without diarrhea: Secondary | ICD-10-CM

## 2020-02-05 NOTE — Progress Notes (Signed)
Subjective:    Patient ID: Jodi [name], female    DOB: 06/23/1951, 69 y.o.   MRN: [id number]  HPI Jodi [name] is a 69 year old female with a history of jejunal diverticulitis, history of IBS with chronic left-sided abdominal discomfort, history of hemorrhoids, prior cholecystectomy who is here for follow-up.  She was last seen in November 2020 by virtual visit.  She is seen in person and is here alone today.  She reports that she started chiropractic therapy on her cervical and lumbar spine.  Interestingly enough this has completely resolved her hemorrhoidal symptoms.  She was having issues with hemorrhoidal prolapse and occasional red blood with wiping.  This has resolved entirely since her lumbar spine manipulation.  She hopes that this will last but given improvement does not feel any further treatment is warranted.  She does continue to intermittently have left-sided abdominal discomfort.  This seems to be worse with trigger foods and drinks such as coffee.  Occasionally if she does strenuous exercise like sit ups she will have pain in that spot.  She knows that emotional responses definitively cause her to have left-sided abdominal pain and also loose stool.  She is always had a tendency to have loose stools and diarrhea.  She will occasionally use 1 Pepto-Bismol tablet prior to leaving the house for any length of time to prevent diarrhea which can occur.  She has continued probiotic but recently switched from Florastor to [employer].  She continues Lactaid on an ongoing basis but is going to try to avoid lactose altogether.   Review of Systems As per HPI, otherwise negative  Current Medications, Allergies, Past Medical History, Past Surgical History, Family History and Social History were reviewed in [employer] record.     Objective:   Physical Exam BP 110/72   Pulse 98   Ht 5' 5.5" (1.664 m)   Wt 191 lb (86.6 kg)   LMP 07/06/2010   BMI 31.30 kg/m  Gen:  awake, alert, NAD HEENT: anicteric CV: RRR, no mrg Pulm: CTA b/l Abd: soft, NT/ND, +BS throughout Ext: no c/c/e Neuro: nonfocal       Assessment & Plan:   69 year old female with a history of jejunal diverticulitis, history of IBS with chronic left-sided abdominal discomfort, history of hemorrhoids, prior cholecystectomy who is here for follow-up.   1.  IBS with left-sided abdominal pain --diarrhea predominant when this is troublesome.  We discussed how emotional responses eliciting GI symptoms would be consistent with IBS rather than diverticulitis.  She manages this quite well with avoiding trigger foods, focusing on high-fiber diet with fruits and vegetables.  Probiotic has also seem to help her over time.  She will continue to avoid lactose and if having any lactose containing food she will continue to use Lactaid tablets.  Okay to use Pepto-Bismol on an as-needed basis for loose stools and she is aware that this can cause stools to look black in color.  2.  History of jejunal diverticulitis --no recent flares of diverticulitis.  Diverticulitis of the jejunum was seen in the left mid abdomen in 2019 by CT scan.  It is difficult to know at times whether her left-sided pain is related to IBS or possibly minor diverticular inflammation.  If she develops severe unrelenting pain associated with fever or chills then she should notify me immediately as we would likely prescribe antibiotics.  3.  Hemorrhoids --prolapsing internal hemorrhoids have improved since starting lumbar spine chiropractic care.  Hopefully this will  continue.  Banding can be considered if they become bothersome in the future  4.  CRC screening --negative Cologuard in October 2017 and October 2020; repeat Cologuard in 3 years versus another screening modality such as colonoscopy at that time based on her preference  30 minutes total spent today including patient facing time, coordination of care, reviewing medical  history/procedures/pertinent radiology studies, and documentation of the encounter.

## 2020-02-05 NOTE — Patient Instructions (Signed)
If you are age 69 or older, your body mass index should be between 23-30. Your Body mass index is 31.3 kg/m. If this is out of the aforementioned range listed, please consider follow up with your Primary Care Provider.  If you are age 58 or younger, your body mass index should be between 19-25. Your Body mass index is 31.3 kg/m. If this is out of the aformentioned range listed, please consider follow up with your Primary Care Provider.   Please continue current medications as directed.  You will follow up with our office on an as needed basis.  Thank you for entrusting me with your care and choosing [facility].  Dr Hilarie Fredrickson

## 2020-02-06 ENCOUNTER — Encounter: Payer: Self-pay | Admitting: Obstetrics & Gynecology

## 2020-02-07 ENCOUNTER — Encounter: Payer: Self-pay | Admitting: Obstetrics & Gynecology

## 2020-02-07 ENCOUNTER — Ambulatory Visit: Payer: Medicare Other | Admitting: Obstetrics & Gynecology

## 2020-02-07 ENCOUNTER — Other Ambulatory Visit: Payer: Self-pay

## 2020-02-07 ENCOUNTER — Telehealth: Payer: Self-pay | Admitting: Obstetrics & Gynecology

## 2020-02-07 VITALS — BP 160/88 | HR 92 | Resp 12 | Ht 65.5 in | Wt 190.0 lb

## 2020-02-07 DIAGNOSIS — L723 Sebaceous cyst: Secondary | ICD-10-CM | POA: Diagnosis not present

## 2020-02-07 NOTE — Telephone Encounter (Signed)
AEX 05/2019 MMG 3/21, Birads 1, neg-[facility]  PMP, no HRT Family hx breast CA  Spoke with pt. Pt states feeling small breast bump/lump on right side this morning while doing SBE. Pt denies redness, breast skin or size changes, fever, chills, or warmth to site.  Pt advised OV for further evaluation. Pt agreeable.  Pt scheduled as work-in appt today 8/4 at 1045 am. Pt agreeable to date and time of appt.   Encounter closed.

## 2020-02-07 NOTE — Progress Notes (Signed)
GYNECOLOGY  VISIT  CC:   Patient complains of having a "tiny bump" in right breast. Patient denies having pain or nipple discharge.  HPI: 69 y.o. G1P1 Married White or Caucasian female here for breast lump that she noticed this morning.  She is not having any pain or nipple discharge.  Last MMG 09/2019.  Has family hx of breast cancer in maternal cousin who was 70 at diagnosis and now >70.  Reports she doesn't do breast exams very often but did one this morning and felt this new finding.  GYNECOLOGIC HISTORY: Patient's last menstrual period was 07/06/2010. Contraception: Postmenopausal Menopausal hormone therapy: none  Patient Active Problem List   Diagnosis Date Noted  . Spider veins 11/23/2019  . Edema of both legs 11/23/2019  . 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 Anxiety disorder 01/04/2015  . Essential (primary) hypertension 01/04/2015  . Hypothyroidism 01/04/2015  . Pure hypercholesterolemia 01/04/2015  . Right upper quadrant pain 01/01/2015  . Vertigo, labyrinthine 11/17/2012  . Snoring 11/17/2012    Past Medical History:  Diagnosis Date  . Abnormal Pap smear of cervix   . Anxiety   . Aortic atherosclerosis ([place])   . Diverticulitis   . Fatty liver disease, nonalcoholic   . Glaucoma   . H/O dizziness   . Hemorrhoids   . History of depression    incest survivor/dysthymia  . Hypertension   . Hyperthyroidism   . IBS (irritable bowel syndrome)    lactose intolerance  . Restless leg syndrome   . Sleep apnea   . Snoring 11/17/2012  . Thyroid disease    hypothyroid  . Varicose veins     Past Surgical History:  Procedure Laterality Date  . CHOLECYSTECTOMY  07/19/2017   Dr. Donne Hazel  . COLPOSCOPY  99, 00, 02, 04, 06  . ENDOVENOUS ABLATION SAPHENOUS VEIN W/ LASER Right 04-08-2015   endovenous laser ablation (right greater saphenous vein) by Victorino Dike MD    . LEEP  3/09   CIN II/III    MEDS:   Current  Outpatient Medications on File Prior to Visit  Medication Sig Dispense Refill  . ALPRAZolam (XANAX) 0.25 MG tablet Take 0.25 mg by mouth as needed for anxiety.    . Bismuth Subsalicylate (PEPTO-BISMOL PO) Take by mouth as needed.    Marland Kitchen EPINEPHrine 0.3 mg/0.3 mL IJ SOAJ injection Inject 0.3 mg into the muscle as needed for anaphylaxis.   0  . fluticasone (FLONASE) 50 MCG/ACT nasal spray Place 2 sprays into both nostrils daily as needed for allergies.     . hydrochlorothiazide (HYDRODIURIL) 25 MG tablet Take 25 mg by mouth daily.    . hydrocortisone (ANUSOL-HC) 2.5 % rectal cream Place rectally 2 (two) times daily. Can use for up to 7 days. 30 g 1  . lactase (LACTAID) 3000 units tablet Take 3,000 Units by mouth as needed (lactaid).     Marland Kitchen levothyroxine (SYNTHROID) 112 MCG tablet Take 112 mcg by mouth daily.    . Multiple Vitamin (MULTIVITAMIN WITH MINERALS) TABS Take 1 tablet by mouth daily.    . NONFORMULARY OR COMPOUNDED ITEM Vitamin E vaginal cream 200u/ml.  Apply 75ml vaginally and small amount externally two to three times weekly. 36 each 3  . PROAIR RESPICLICK 637 (90 Base) MCG/ACT AEPB Inhale 1 puff into the lungs every 4 (four) hours as needed for wheezing or shortness of breath.    . saccharomyces boulardii (FLORASTOR) 250 MG capsule Take one tablet  BID x 1 month (Patient taking differently: Take 250 mg by mouth daily. ) 60 capsule 0  . timolol (TIMOPTIC) 0.5 % ophthalmic solution Place 1 drop into both eyes every morning.  3   No current facility-administered medications on file prior to visit.    ALLERGIES: Sulfites, Iodine, Macrodantin [nitrofurantoin macrocrystal], Shellfish allergy, Almond oil, and Bentyl [dicyclomine hcl]  Family History  Problem Relation Age of Onset  . Hypertension Father   . Diabetes Father   . Dementia Father   . Hyperlipidemia Father   . Prostate cancer Father   . Colon polyps Father   . Parkinson's disease Mother   . Dementia Mother   . Hypertension  Mother   . Breast cancer Cousin        maternal side of family  . Dementia Brother   . Colon polyps Brother   . Colon cancer Neg Hx   . Esophageal cancer Neg Hx   . Stomach cancer Neg Hx   . Liver disease Neg Hx   . Pancreatic cancer Neg Hx     SH:  Married, non smoker  Review of Systems  Constitutional:       Breast lump  All other systems reviewed and are negative.   PHYSICAL EXAMINATION:    BP (!) 160/88 (BP Location: Right Arm, Patient Position: Sitting, Cuff Size: Normal)   Pulse 92   Resp 12   Ht 5' 5.5" (1.664 m)   Wt 190 lb (86.2 kg)   LMP 07/06/2010   BMI 31.14 kg/m     Physical Exam Constitutional:      Appearance: Normal appearance.  Neck:     Comments: Thyroid non enlarged and without nodules. Chest:     Breasts:        Right: No inverted nipple, mass, nipple discharge, skin change or tenderness.        Left: Skin change present. No inverted nipple, mass, nipple discharge or tenderness.    Musculoskeletal:     Cervical back: Normal range of motion. No tenderness.  Lymphadenopathy:     Cervical: No cervical adenopathy.     Upper Body:     Right upper body: No supraclavicular or axillary adenopathy.     Left upper body: No supraclavicular or axillary adenopathy.  Neurological:     General: No focal deficit present.     Mental Status: She is alert.  Psychiatric:        Mood and Affect: Mood normal.        Behavior: Behavior normal.    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  She knew she had this but just doesn't perform self exams very often.  She is going to start doing monthly exams and will call if she notices any changes.  Has appt already scheduled for September so will recheck at that date as well.

## 2020-02-07 NOTE — Telephone Encounter (Signed)
Patient noticed a small place on breast that she would like check.

## 2020-02-19 ENCOUNTER — Encounter: Payer: Self-pay | Admitting: Podiatrist

## 2020-02-19 ENCOUNTER — Ambulatory Visit (INDEPENDENT_AMBULATORY_CARE_PROVIDER_SITE_OTHER): Payer: Medicare Other

## 2020-02-19 ENCOUNTER — Other Ambulatory Visit: Payer: Self-pay

## 2020-02-19 ENCOUNTER — Ambulatory Visit: Payer: Medicare Other | Admitting: Podiatrist

## 2020-02-19 DIAGNOSIS — M7751 Other enthesopathy of right foot: Secondary | ICD-10-CM | POA: Diagnosis not present

## 2020-02-19 DIAGNOSIS — S92501A Displaced unspecified fracture of right lesser toe(s), initial encounter for closed fracture: Secondary | ICD-10-CM | POA: Diagnosis not present

## 2020-02-19 DIAGNOSIS — M19071 Primary osteoarthritis, right ankle and foot: Secondary | ICD-10-CM | POA: Diagnosis not present

## 2020-02-19 NOTE — Progress Notes (Signed)
Xray- cyst middle phalanx Fracture lateral side middle plananx Splay between 2/3 toes   Chief Complaint  Patient presents with  . Foot Problem    R 3rd toe. x2 months. Pt stated, "I walk barefoot all the time. I thought that a bee stung me. I had pain that shot from the base of my toe to the top of my toe. After awhile, the first joint became red and swollen (around the nail). PCP evaluated it and didn't think it was gout. No drainage. The redness and swelling improved. I had pain again last week, but it has resolved".      HPI: Patient is 69 y.o. female who presents today for the concerns as listed above. She has had swelling at the tip of the right third toe for about 3 months. She denies any trauma or injury to the toe she can recall.    Patient Active Problem List   Diagnosis Date Noted  . Spider veins 11/23/2019  . Edema of both legs 11/23/2019  . 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 Anxiety disorder 01/04/2015  . Essential (primary) hypertension 01/04/2015  . Hypothyroidism 01/04/2015  . Pure hypercholesterolemia 01/04/2015  . Right upper quadrant pain 01/01/2015  . Vertigo, labyrinthine 11/17/2012  . Snoring 11/17/2012    Current Outpatient Medications on File Prior to Visit  Medication Sig Dispense Refill  . EPINEPHrine 0.3 mg/0.3 mL IJ SOAJ injection Inject 0.3 mg into the muscle as needed for anaphylaxis.   0  . fluticasone (FLONASE) 50 MCG/ACT nasal spray Place 2 sprays into both nostrils daily as needed for allergies.     Marland Kitchen lactase (LACTAID) 3000 units tablet Take 3,000 Units by mouth as needed (lactaid).     Marland Kitchen levothyroxine (SYNTHROID) 112 MCG tablet Take 112 mcg by mouth daily.    . Multiple Vitamin (MULTIVITAMIN WITH MINERALS) TABS Take 1 tablet by mouth daily.    Marland Kitchen saccharomyces boulardii (FLORASTOR) 250 MG capsule Take one tablet BID x 1 month (Patient taking differently: Take 250 mg by mouth daily. ) 60  capsule 0  . timolol (TIMOPTIC) 0.5 % ophthalmic solution Place 1 drop into both eyes every morning.  3  . ALPRAZolam (XANAX) 0.25 MG tablet Take 0.25 mg by mouth as needed for anxiety. (Patient not taking: Reported on 02/19/2020)    . Bismuth Subsalicylate (PEPTO-BISMOL PO) Take by mouth as needed.    . hydrochlorothiazide (HYDRODIURIL) 25 MG tablet Take 25 mg by mouth daily.    . hydrocortisone (ANUSOL-HC) 2.5 % rectal cream Place rectally 2 (two) times daily. Can use for up to 7 days. (Patient not taking: Reported on 02/19/2020) 30 g 1  . NONFORMULARY OR COMPOUNDED ITEM Vitamin E vaginal cream 200u/ml.  Apply 78ml vaginally and small amount externally two to three times weekly. (Patient not taking: Reported on 02/19/2020) 36 each 3  . PROAIR RESPICLICK 371 (90 Base) MCG/ACT AEPB Inhale 1 puff into the lungs every 4 (four) hours as needed for wheezing or shortness of breath. (Patient not taking: Reported on 02/19/2020)     No current facility-administered medications on file prior to visit.    Allergies  Allergen Reactions  . Sulfites Anaphylaxis  . Iodine     Was told to place this as an allergy along with sulfites as she had almost anaphylactic reaction to shellfish years ago.  Clancy Gourd [Nitrofurantoin Macrocrystal] Nausea Only and Other (See Comments)    Headache  . Shellfish Allergy   .  Almond Oil Itching and Rash    ALL TREE NUTS  . Bentyl [Dicyclomine Hcl] Anxiety    Patient noticed anxiety with this medication.    Review of Systems No fevers, chills, nausea, muscle aches, no difficulty breathing, no calf pain, no chest pain or shortness of breath.   Physical Exam  GENERAL APPEARANCE: Alert, conversant. Appropriately groomed. No acute distress.   VASCULAR: Pedal pulses palpable DP and PT bilateral.  Capillary refill time is immediate to all digits,  Proximal to distal cooling it warm to warm.  Digital perfusion adequate.   NEUROLOGIC: sensation is intact epicritically and  protectively to 5.07 monofilament at 5/5 sites bilateral.  Light touch is intact bilateral, vibratory sensation intact bilateral, achilles tendon reflex is intact bilateral.   MUSCULOSKELETAL: acceptable muscle strength, tone and stability bilateral. Splaying between toes 2/3 right noted.   Swelling at the distal ip joint of the right third toe is noted.  No calor noted.  Minimal pain with medial to lateral compression noted.  DERMATOLOGIC: skin is warm, supple, and dry.  No open lesions noted.  No rash, no pre ulcerative lesions. Digital nails are asymptomatic.    Xray- 3 views right foot obtained.  Splaying of toes 2/3 noted.  Joint space narrowing distal ip joint right 3rd toe.  Small bone cyst distal aspect of the middle phalanx identified.  Fracture fragment seen on the medial aspect of the distal middle phalanx is identified.    Assessment     ICD-10-CM   1. Closed fracture of phalanx of right third toe, initial encounter  S92.501A   2. Degenerative arthritis of toe joint, right  M19.071 DG Foot Complete Right     Plan  Discussed exam and xray findings with the patient.  Discussed the swelling and discomfort will likely resolve in 4-6 weeks as the fracture fragment heals.  She will continue to wear her supportive shoegear.  She will call if any concerns arise.

## 2020-03-28 ENCOUNTER — Emergency Department (HOSPITAL_COMMUNITY)
Admission: EM | Admit: 2020-03-28 | Discharge: 2020-03-28 | Disposition: A | Discharge status: Home / Self Care | Payer: Medicare Other | Attending: Emergency Medicine | Admitting: Emergency Medicine

## 2020-03-28 ENCOUNTER — Other Ambulatory Visit: Payer: Self-pay

## 2020-03-28 ENCOUNTER — Encounter (HOSPITAL_COMMUNITY): Payer: Self-pay

## 2020-03-28 ENCOUNTER — Emergency Department (HOSPITAL_COMMUNITY): Payer: Medicare Other

## 2020-03-28 DIAGNOSIS — Z87891 Personal history of nicotine dependence: Secondary | ICD-10-CM | POA: Insufficient documentation

## 2020-03-28 DIAGNOSIS — Z79899 Other long term (current) drug therapy: Secondary | ICD-10-CM | POA: Diagnosis not present

## 2020-03-28 DIAGNOSIS — E039 Hypothyroidism, unspecified: Secondary | ICD-10-CM | POA: Diagnosis not present

## 2020-03-28 DIAGNOSIS — R42 Dizziness and giddiness: Secondary | ICD-10-CM | POA: Diagnosis present

## 2020-03-28 DIAGNOSIS — I10 Essential (primary) hypertension: Secondary | ICD-10-CM | POA: Diagnosis not present

## 2020-03-28 DIAGNOSIS — Z79890 Hormone replacement therapy (postmenopausal): Secondary | ICD-10-CM | POA: Insufficient documentation

## 2020-03-28 MED ORDER — MECLIZINE HCL 25 MG PO TABS
25.0000 mg | ORAL_TABLET | Freq: Once | ORAL | Status: DC
  Filled 2020-03-28: qty 1

## 2020-03-28 NOTE — ED Provider Notes (Signed)
[facility] DEPT Provider Note   CSN: [id number] Arrival date & time: 03/28/20  0524     History Chief Complaint  Patient presents with  . Dizziness    Jodi [name] is a 69 y.o. female.   Jodi [name] is a 69 year old female with a history of vertigo (s/p vestibular rehab) presenting today for dizziness that started at 5 am this morning. Pt states she woke up this morning and felt like something was pulling her towards the left when she was ambulating from the bed to go the bathroom. She states the room was dark and she couldn't tell if the room was spinning, but made it to the bathroom without injury. Pt states that her symptoms have been improving since this morning and feel like her past episodes of vertigo. No medications taken PTA. She denies any tinnitus, blurry vision, neausea, vomiting, headache, extremity numbness/paresthesias, fever, recent URI, or recent head injury.  Patient reports that she started a new blood pressure medication two weeks ago and her thyroid medication dosage has also been recently increased.    Dizziness      Past Medical History:  Diagnosis Date  . Abnormal Pap smear of cervix   . Anxiety   . Aortic atherosclerosis ([place])   . Diverticulitis   . Fatty liver disease, nonalcoholic   . Glaucoma   . H/O dizziness   . Hemorrhoids   . History of depression    incest survivor/dysthymia  . Hypertension   . Hyperthyroidism   . IBS (irritable bowel syndrome)    lactose intolerance  . Restless leg syndrome   . Sleep apnea   . Snoring 11/17/2012  . Thyroid disease    hypothyroid  . Varicose veins     Patient Active Problem List   Diagnosis Date Noted  . Spider veins 11/23/2019  . Edema of both legs 11/23/2019  . 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 Anxiety disorder 01/04/2015  . Essential (primary) hypertension 01/04/2015  . Hypothyroidism 01/04/2015  .  Pure hypercholesterolemia 01/04/2015  . Right upper quadrant pain 01/01/2015  . Vertigo, labyrinthine 11/17/2012  . Snoring 11/17/2012    Past Surgical History:  Procedure Laterality Date  . CHOLECYSTECTOMY  07/19/2017   Dr. Donne Hazel  . COLPOSCOPY  99, 00, 02, 04, 06  . ENDOVENOUS ABLATION SAPHENOUS VEIN W/ LASER Right 04-08-2015   endovenous laser ablation (right greater saphenous vein) by Victorino Dike MD    . LEEP  3/09   CIN II/III     OB History    Gravida  1   Para  1   Term      Preterm      AB      Living  1     SAB      TAB      Ectopic      Multiple      Live Births              Family History  Problem Relation Age of Onset  . Hypertension Father   . Diabetes Father   . Dementia Father   . Hyperlipidemia Father   . Prostate cancer Father   . Colon polyps Father   . Parkinson's disease Mother   . Dementia Mother   . Hypertension Mother   . Breast cancer Cousin        maternal side of family  . Dementia Brother   .  Colon polyps Brother   . Colon cancer Neg Hx   . Esophageal cancer Neg Hx   . Stomach cancer Neg Hx   . Liver disease Neg Hx   . Pancreatic cancer Neg Hx     Social History   Tobacco Use  . Smoking status: Former Smoker    Quit date: 09/21/1978    Years since quitting: 41.5  . Smokeless tobacco: Never Used  . Tobacco comment: quit 30 years ago  Vaping Use  . Vaping Use: Never used  Substance Use Topics  . Alcohol use: Not Currently    Alcohol/week: 0.0 standard drinks  . Drug use: No    Home Medications Prior to Admission medications   Medication Sig Start Date End Date Taking? Authorizing Provider  Bismuth Subsalicylate (PEPTO-BISMOL PO) Take by mouth as needed.    [provider]  EPINEPHrine 0.3 mg/0.3 mL IJ SOAJ injection Inject 0.3 mg into the muscle as needed for anaphylaxis.  02/21/16   [provider]  fluticasone (FLONASE) 50 MCG/ACT nasal spray Place 2 sprays into both nostrils daily as  needed for allergies.  04/17/19   [provider]  hydrochlorothiazide (HYDRODIURIL) 25 MG tablet Take 25 mg by mouth daily.    [provider]  lactase (LACTAID) 3000 units tablet Take 3,000 Units by mouth as needed (lactaid).     [provider]  levothyroxine (SYNTHROID) 112 MCG tablet Take 112 mcg by mouth daily. 12/25/19   [provider]  Multiple Vitamin (MULTIVITAMIN WITH MINERALS) TABS Take 1 tablet by mouth daily.    [provider]  saccharomyces boulardii (FLORASTOR) 250 MG capsule Take one tablet BID x 1 month Patient taking differently: Take 250 mg by mouth daily.  12/29/17   Pyrtle, Lajuan Lines, MD  timolol (TIMOPTIC) 0.5 % ophthalmic solution Place 1 drop into both eyes every morning. 12/16/17   [provider]    Allergies    Sulfites, Iodine, Macrodantin [nitrofurantoin macrocrystal], Shellfish allergy, Almond oil, and Bentyl [dicyclomine hcl]  Review of Systems   Review of Systems  Neurological: Positive for dizziness.  Ten systems reviewed and are negative for acute change, except as noted in the HPI.    Physical Exam Updated Vital Signs BP 138/78 (BP Location: Left Arm)   Pulse (!) 104   Temp 98.4 F (36.9 C) (Oral)   Resp 15   Ht 5\' 5"  (1.651 m)   Wt 86.2 kg   LMP 07/06/2010   SpO2 97%   BMI 31.62 kg/m   Physical Exam Vitals and nursing note reviewed.  Constitutional:      General: She is not in acute distress.    Appearance: She is well-developed. She is not diaphoretic.     Comments: Nontoxic appearing and in NAD  HENT:     Head: Normocephalic and atraumatic.     Right Ear: External ear normal.     Left Ear: External ear normal.  Eyes:     General: No scleral icterus.    Extraocular Movements: Extraocular movements intact.     Conjunctiva/sclera: Conjunctivae normal.     Pupils: Pupils are equal, round, and reactive to light.     Comments: No nystagmus  Neck:     Comments: No meningismus Pulmonary:      Effort: Pulmonary effort is normal. No respiratory distress.     Comments: Respirations even and unlabored Musculoskeletal:        General: Normal range of motion.     Cervical  back: Normal range of motion.  Skin:    General: Skin is warm and dry.     Coloration: Skin is not pale.     Findings: No erythema or rash.  Neurological:     Mental Status: She is alert and oriented to person, place, and time.     Comments: GCS 15. Speech is goal oriented. No cranial nerve deficits appreciated; symmetric eyebrow raise, no facial drooping, tongue midline. Patient has equal grip strength bilaterally with 5/5 strength against resistance in all major muscle groups bilaterally. Sensation to light touch intact. Patient moves extremities without ataxia.  No dysmetria to finger-nose-finger.  Psychiatric:        Behavior: Behavior normal.     ED Results / Procedures / Treatments   Labs (all labs ordered are listed, but only abnormal results are displayed) Labs Reviewed - No data to display  EKG ED ECG REPORT   Date: 03/28/2020  Rate: 92  Rhythm: normal sinus rhythm  QRS Axis: normal  Intervals: normal  ST/T Wave abnormalities: normal  Conduction Disutrbances:none  Narrative Interpretation: NSR; consider LAE. No STEMI or ischemic change.  Old EKG Reviewed: none available  I have personally reviewed the EKG tracing and agree with the computerized printout as noted.   Radiology No results found.  Procedures Procedures (including critical care time)  Medications Ordered in ED Medications - No data to display  ED Course  I have reviewed the triage vital signs and the nursing notes.  Pertinent labs & imaging results that were available during my care of the patient were reviewed by me and considered in my medical decision making (see chart for details).    MDM Rules/Calculators/A&P                          69 year old female presents to the emergency department for evaluation of  dizziness.  Describes her dizziness as feeling off balance, as though something was pulling her towards the left.  Her symptoms have been spontaneously improving without intervention.  She has no focal neurologic deficit.  No recent head injury.  Denies headache.  CT head ordered to evaluate for potential central cause, but low suspicion for CVA given spontaneous improvement and reassuring exam.  Will also check EKG and orthostatics given recent added BP medication.  No c/o chest pain, SOB.  Patient ordered to receive meclizine, but declines medication as she says that it makes her feel funny.  Further denies other antiemetics for vertigo management.  Care signed out to [place], PA-C at shift change pending CT imaging.  Do not feel strongly that patient requires additional imaging with MRI if CT reassuring and vertigo continues to subside.   Final Clinical Impression(s) / ED Diagnoses Final diagnoses:  Vertigo    Rx / DC Orders ED Discharge Orders    None       Antonietta Breach, PA-C 03/28/20 0640    Shanon Rosser, MD 03/28/20 [phone]

## 2020-03-28 NOTE — ED Triage Notes (Signed)
Pt states waking up at 0500 and feeling dizzy. States she tried walking and would lean more to the left. She thinks it is her vertigo.

## 2020-03-28 NOTE — Discharge Instructions (Signed)
Your head CT today was reassuring. Please come back to the emergency department for any new or worsening concerning symptoms of spoke about. Use the attached instructions on managing anxiety. Please follow-up with your primary care in the next couple of days. If the symptoms were to return please come back to the emergency department like we spoke about. Buy blood pressure cuff and check your blood pressure at home, follow-up with your primary care about this. I hope you feel better!!

## 2020-03-28 NOTE — ED Provider Notes (Signed)
Care of the patient was assumed from K.Humes PA-C at 75 see this provider's note for complete history of present illness, review of systems, and physical exam.  Briefly, the patient is a 69 y.o. female who presented to the ED with vertigo, patient states that it felt like her normal vertigo.  Normal neuro exam.  Her symptoms have been spontaneously improving with intervention.  CT was ordered by previous PA to evaluate for potential central cause, however there was a low suspicion due to a spontaneous improvement and normal neuro exam.  History significant for vertigo s/p stent rehab.  Dr. Florina Ou also saw patient in conjunction with PA.  Plan at time of handoff:  Awaiting CT then dc.    Physical Exam  BP 138/78 (BP Location: Left Arm)   Pulse (!) 104   Temp 98.4 F (36.9 C) (Oral)   Resp 15   Ht 5\' 5"  (1.651 m)   Wt 86.2 kg   LMP 07/06/2010   SpO2 97%   BMI 31.62 kg/m   Physical Exam Constitutional:      General: She is not in acute distress.    Appearance: Normal appearance. She is not ill-appearing, toxic-appearing or diaphoretic.  HENT:     Head: Normocephalic and atraumatic.     Mouth/Throat:     Mouth: Mucous membranes are moist.     Pharynx: Oropharynx is clear.  Eyes:     General: No scleral icterus.    Extraocular Movements: Extraocular movements intact.     Pupils: Pupils are equal, round, and reactive to light.  Cardiovascular:     Rate and Rhythm: Normal rate and regular rhythm.     Pulses: Normal pulses.     Heart sounds: Normal heart sounds.  Pulmonary:     Effort: Pulmonary effort is normal. No respiratory distress.     Breath sounds: Normal breath sounds. No stridor. No wheezing, rhonchi or rales.  Chest:     Chest wall: No tenderness.  Abdominal:     General: Abdomen is flat. There is no distension.     Palpations: Abdomen is soft.     Tenderness: There is no abdominal tenderness. There is no guarding or rebound.  Musculoskeletal:        General: No  swelling or tenderness. Normal range of motion.     Cervical back: Normal range of motion and neck supple. No rigidity.     Right lower leg: No edema.     Left lower leg: No edema.  Skin:    General: Skin is warm and dry.     Capillary Refill: Capillary refill takes less than 2 seconds.     Coloration: Skin is not pale.  Neurological:     General: No focal deficit present.     Mental Status: She is alert and oriented to person, place, and time.     Comments: Alert. Clear speech. No facial droop. CNIII-XII grossly intact. Bilateral upper and lower extremities' sensation grossly intact. 5/5 symmetric strength with grip strength and with plantar and dorsi flexion bilaterally. Normal finger to nose bilaterally. Negative pronator drift.   Psychiatric:        Mood and Affect: Mood normal.        Behavior: Behavior normal.     ED Course/Procedures     Procedures  MDM  69 year old female that presents to the emergency department for dizziness, dizziness is described as her normal vertigo which she has been diagnosed with.  Symptoms have spontaneously improved with  intervention.  Repeat neuro exam normal. Patient was able to walk normally without dizziness, states that she feels back to normal. Patient declined meclizine.  CT head imaging with no acute intracranial abnormality.  Orthostatics show drop in diastolic from lying to sitting and then increased from sitting to standing, therefore unsure if patient is actually orthostatic.  No drop in systolic from lying to sitting.  No increase in pulse.  Patient was only dizzy when there was sudden movement, no dizziness on standing. Symptoms not consistent with TIA.  EKG evaluated by Dr. Florina Ou. Patient states that all symptoms have resolved, ready for discharge. Symptomatic treatment discussed.  Doubt need for further emergent work up at this time. I explained the diagnosis and have given explicit precautions to return to the ER including for any other  new or worsening symptoms. The patient understands and accepts the medical plan as it's been dictated and I have answered their questions. Discharge instructions concerning home care and prescriptions have been given. The patient is STABLE and is discharged to home in good condition.         Alfredia Client, PA-C 03/28/20 0165    Valarie Merino, MD 03/31/20 [phone]

## 2020-04-01 NOTE — Progress Notes (Deleted)
GYNECOLOGY  VISIT  CC:   ***  HPI: 69 y.o. G1P1 Married White or Caucasian female here for 7mth follow up of vitamin e use & breast recheck  GYNECOLOGIC HISTORY: Patient's last menstrual period was 07/06/2010. Contraception: *** Menopausal hormone therapy: ***  Patient Active Problem List   Diagnosis Date Noted  . Spider veins 11/23/2019  . Edema of both legs 11/23/2019  . 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 Anxiety disorder 01/04/2015  . Essential (primary) hypertension 01/04/2015  . Hypothyroidism 01/04/2015  . Pure hypercholesterolemia 01/04/2015  . Right upper quadrant pain 01/01/2015  . Vertigo, labyrinthine 11/17/2012  . Snoring 11/17/2012    Past Medical History:  Diagnosis Date  . Abnormal Pap smear of cervix   . Anxiety   . Aortic atherosclerosis ([place])   . Diverticulitis   . Fatty liver disease, nonalcoholic   . Glaucoma   . H/O dizziness   . Hemorrhoids   . History of depression    incest survivor/dysthymia  . Hypertension   . Hyperthyroidism   . IBS (irritable bowel syndrome)    lactose intolerance  . Restless leg syndrome   . Sleep apnea   . Snoring 11/17/2012  . Thyroid disease    hypothyroid  . Varicose veins     Past Surgical History:  Procedure Laterality Date  . CHOLECYSTECTOMY  07/19/2017   Dr. Donne Hazel  . COLPOSCOPY  99, 00, 02, 04, 06  . ENDOVENOUS ABLATION SAPHENOUS VEIN W/ LASER Right 04-08-2015   endovenous laser ablation (right greater saphenous vein) by Victorino Dike MD    . LEEP  3/09   CIN II/III    MEDS:   Current Outpatient Medications on File Prior to Visit  Medication Sig Dispense Refill  . Bismuth Subsalicylate (PEPTO-BISMOL PO) Take by mouth as needed.    Marland Kitchen EPINEPHrine 0.3 mg/0.3 mL IJ SOAJ injection Inject 0.3 mg into the muscle as needed for anaphylaxis.   0  . fluticasone (FLONASE) 50 MCG/ACT nasal spray Place 2 sprays into both nostrils daily as needed for  allergies.     . hydrochlorothiazide (HYDRODIURIL) 25 MG tablet Take 25 mg by mouth daily.    Marland Kitchen lactase (LACTAID) 3000 units tablet Take 3,000 Units by mouth as needed (lactaid).     Marland Kitchen levothyroxine (SYNTHROID) 112 MCG tablet Take 112 mcg by mouth daily.    . Multiple Vitamin (MULTIVITAMIN WITH MINERALS) TABS Take 1 tablet by mouth daily.    Marland Kitchen saccharomyces boulardii (FLORASTOR) 250 MG capsule Take one tablet BID x 1 month (Patient taking differently: Take 250 mg by mouth daily. ) 60 capsule 0  . timolol (TIMOPTIC) 0.5 % ophthalmic solution Place 1 drop into both eyes every morning.  3   No current facility-administered medications on file prior to visit.    ALLERGIES: Sulfites, Iodine, Macrodantin [nitrofurantoin macrocrystal], Shellfish allergy, Almond oil, and Bentyl [dicyclomine hcl]  Family History  Problem Relation Age of Onset  . Hypertension Father   . Diabetes Father   . Dementia Father   . Hyperlipidemia Father   . Prostate cancer Father   . Colon polyps Father   . Parkinson's disease Mother   . Dementia Mother   . Hypertension Mother   . Breast cancer Cousin        maternal side of family  . Dementia Brother   . Colon polyps Brother   . Colon cancer Neg Hx   . Esophageal cancer Neg Hx   .  Stomach cancer Neg Hx   . Liver disease Neg Hx   . Pancreatic cancer Neg Hx     SH:  ***  Review of Systems  PHYSICAL EXAMINATION:    LMP 07/06/2010     General appearance: alert, cooperative and appears stated age Neck: no adenopathy, supple, symmetrical, trachea midline and thyroid {CHL AMB PHY EX THYROID NORM DEFAULT:[phone]::"normal to inspection and palpation"} CV:  {Exam; heart brief:31539} Lungs:  {pe lungs ob:314451::"clear to auscultation, no wheezes, rales or rhonchi, symmetric air entry"} Breasts: {Exam; breast:13139::"normal appearance, no masses or tenderness"} Abdomen: soft, non-tender; bowel sounds normal; no masses,  no organomegaly Lymph:  no inguinal  LAD noted  Pelvic: External genitalia:  no lesions              Urethra:  normal appearing urethra with no masses, tenderness or lesions              Bartholins and Skenes: normal                 Vagina: normal appearing vagina with normal color and discharge, no lesions              Cervix: {CHL AMB PHY EX CERVIX NORM DEFAULT:[id number]::"no lesions"}              Bimanual Exam:  Uterus:  {CHL AMB PHY EX UTERUS NORM DEFAULT:[phone]::"normal size, contour, position, consistency, mobility, non-tender"}              Adnexa: {CHL AMB PHY EX ADNEXA NO MASS DEFAULT:[phone]::"no mass, fullness, tenderness"}              Rectovaginal: {yes no:314532}.  Confirms.              Anus:  normal sphincter tone, no lesions  Chaperone, ***Terence Lux, CMA, was present for exam.  Assessment: ***  Plan: ***   {NUMBERS; -10-45 JOINT ROM:10287} minutes of total time was spent for this patient encounter, including preparation, face-to-face counseling with the patient and coordination of care, and documentation of the encounter.

## 2020-04-02 ENCOUNTER — Telehealth: Payer: Self-pay

## 2020-04-02 ENCOUNTER — Ambulatory Visit: Payer: Medicare Other | Admitting: Obstetrics & Gynecology

## 2020-04-02 NOTE — Telephone Encounter (Signed)
Patient cancelled 3 month recheck because she has a sore throat. Rescheduled to 04/09/20.

## 2020-04-04 NOTE — Progress Notes (Deleted)
GYNECOLOGY  VISIT  CC:   ***  HPI: 69 y.o. G1P1 Married White or Caucasian female here for follow up after vitamin e use.  GYNECOLOGIC HISTORY: Patient's last menstrual period was 07/06/2010. Contraception: *** Menopausal hormone therapy: ***  Patient Active Problem List   Diagnosis Date Noted  . Spider veins 11/23/2019  . Edema of both legs 11/23/2019  . 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 Anxiety disorder 01/04/2015  . Essential (primary) hypertension 01/04/2015  . Hypothyroidism 01/04/2015  . Pure hypercholesterolemia 01/04/2015  . Right upper quadrant pain 01/01/2015  . Vertigo, labyrinthine 11/17/2012  . Snoring 11/17/2012    Past Medical History:  Diagnosis Date  . Abnormal Pap smear of cervix   . Anxiety   . Aortic atherosclerosis ([place])   . Diverticulitis   . Fatty liver disease, nonalcoholic   . Glaucoma   . H/O dizziness   . Hemorrhoids   . History of depression    incest survivor/dysthymia  . Hypertension   . Hyperthyroidism   . IBS (irritable bowel syndrome)    lactose intolerance  . Restless leg syndrome   . Sleep apnea   . Snoring 11/17/2012  . Thyroid disease    hypothyroid  . Varicose veins     Past Surgical History:  Procedure Laterality Date  . CHOLECYSTECTOMY  07/19/2017   Dr. Donne Hazel  . COLPOSCOPY  99, 00, 02, 04, 06  . ENDOVENOUS ABLATION SAPHENOUS VEIN W/ LASER Right 04-08-2015   endovenous laser ablation (right greater saphenous vein) by Victorino Dike MD    . LEEP  3/09   CIN II/III    MEDS:   Current Outpatient Medications on File Prior to Visit  Medication Sig Dispense Refill  . Bismuth Subsalicylate (PEPTO-BISMOL PO) Take by mouth as needed.    Marland Kitchen EPINEPHrine 0.3 mg/0.3 mL IJ SOAJ injection Inject 0.3 mg into the muscle as needed for anaphylaxis.   0  . fluticasone (FLONASE) 50 MCG/ACT nasal spray Place 2 sprays into both nostrils daily as needed for allergies.     .  hydrochlorothiazide (HYDRODIURIL) 25 MG tablet Take 25 mg by mouth daily.    Marland Kitchen lactase (LACTAID) 3000 units tablet Take 3,000 Units by mouth as needed (lactaid).     Marland Kitchen levothyroxine (SYNTHROID) 112 MCG tablet Take 112 mcg by mouth daily.    . Multiple Vitamin (MULTIVITAMIN WITH MINERALS) TABS Take 1 tablet by mouth daily.    Marland Kitchen saccharomyces boulardii (FLORASTOR) 250 MG capsule Take one tablet BID x 1 month (Patient taking differently: Take 250 mg by mouth daily. ) 60 capsule 0  . timolol (TIMOPTIC) 0.5 % ophthalmic solution Place 1 drop into both eyes every morning.  3   No current facility-administered medications on file prior to visit.    ALLERGIES: Sulfites, Iodine, Macrodantin [nitrofurantoin macrocrystal], Shellfish allergy, Almond oil, and Bentyl [dicyclomine hcl]  Family History  Problem Relation Age of Onset  . Hypertension Father   . Diabetes Father   . Dementia Father   . Hyperlipidemia Father   . Prostate cancer Father   . Colon polyps Father   . Parkinson's disease Mother   . Dementia Mother   . Hypertension Mother   . Breast cancer Cousin        maternal side of family  . Dementia Brother   . Colon polyps Brother   . Colon cancer Neg Hx   . Esophageal cancer Neg Hx   . Stomach cancer Neg  Hx   . Liver disease Neg Hx   . Pancreatic cancer Neg Hx     SH:  ***  Review of Systems  PHYSICAL EXAMINATION:    LMP 07/06/2010     General appearance: alert, cooperative and appears stated age Neck: no adenopathy, supple, symmetrical, trachea midline and thyroid {CHL AMB PHY EX THYROID NORM [phone]::"normal to inspection and palpation"} CV:  {Exam; heart brief:31539} Lungs:  {pe lungs ob:314451::"clear to auscultation, no wheezes, rales or rhonchi, symmetric air entry"} Breasts: {Exam; breast:13139::"normal appearance, no masses or tenderness"} Abdomen: soft, non-tender; bowel sounds normal; no masses,  no organomegaly Lymph:  no inguinal LAD noted  Pelvic:  External genitalia:  no lesions              Urethra:  normal appearing urethra with no masses, tenderness or lesions              Bartholins and Skenes: normal                 Vagina: normal appearing vagina with normal color and discharge, no lesions              Cervix: {CHL AMB PHY EX CERVIX NORM DEFAULT:[phone]::"no lesions"}              Bimanual Exam:  Uterus:  {CHL AMB PHY EX UTERUS NORM DEFAULT:[phone]::"normal size, contour, position, consistency, mobility, non-tender"}              Adnexa: {CHL AMB PHY EX ADNEXA NO MASS DEFAULT:[phone]::"no mass, fullness, tenderness"}              Rectovaginal: {yes no:314532}.  Confirms.              Anus:  normal sphincter tone, no lesions  Chaperone, ***Terence Lux, CMA, was present for exam.  Assessment: ***  Plan: ***   {NUMBERS; -10-45 JOINT ROM:10287} minutes of total time was spent for this patient encounter, including preparation, face-to-face counseling with the patient and coordination of care, and documentation of the encounter.

## 2020-04-09 ENCOUNTER — Ambulatory Visit: Payer: Medicare Other | Admitting: Obstetrics & Gynecology

## 2020-04-09 ENCOUNTER — Encounter: Payer: Self-pay | Admitting: Obstetrics & Gynecology

## 2020-04-09 NOTE — Telephone Encounter (Signed)
Patient cancelled appointment for 3 month recheck due to family issue.

## 2020-04-10 ENCOUNTER — Emergency Department (HOSPITAL_COMMUNITY)
Admission: EM | Admit: 2020-04-10 | Discharge: 2020-04-10 | Disposition: A | Discharge status: Home / Self Care | Payer: Medicare Other | Attending: Emergency Medicine | Admitting: Emergency Medicine

## 2020-04-10 ENCOUNTER — Other Ambulatory Visit: Payer: Self-pay

## 2020-04-10 ENCOUNTER — Emergency Department (HOSPITAL_COMMUNITY): Payer: Medicare Other

## 2020-04-10 ENCOUNTER — Encounter (HOSPITAL_COMMUNITY): Payer: Self-pay

## 2020-04-10 DIAGNOSIS — E039 Hypothyroidism, unspecified: Secondary | ICD-10-CM | POA: Diagnosis not present

## 2020-04-10 DIAGNOSIS — E876 Hypokalemia: Secondary | ICD-10-CM

## 2020-04-10 DIAGNOSIS — R0609 Other forms of dyspnea: Secondary | ICD-10-CM | POA: Insufficient documentation

## 2020-04-10 DIAGNOSIS — Z20822 Contact with and (suspected) exposure to covid-19: Secondary | ICD-10-CM | POA: Insufficient documentation

## 2020-04-10 DIAGNOSIS — R002 Palpitations: Secondary | ICD-10-CM | POA: Insufficient documentation

## 2020-04-10 DIAGNOSIS — Z87891 Personal history of nicotine dependence: Secondary | ICD-10-CM | POA: Diagnosis not present

## 2020-04-10 DIAGNOSIS — I10 Essential (primary) hypertension: Secondary | ICD-10-CM | POA: Insufficient documentation

## 2020-04-10 DIAGNOSIS — E0590 Thyrotoxicosis, unspecified without thyrotoxic crisis or storm: Secondary | ICD-10-CM | POA: Diagnosis not present

## 2020-04-10 DIAGNOSIS — R0602 Shortness of breath: Secondary | ICD-10-CM | POA: Diagnosis present

## 2020-04-10 LAB — CBC WITH DIFFERENTIAL/PLATELET
Abs Immature Granulocytes: 0.02 10*3/uL (ref 0.00–0.07)
Basophils Absolute: 0 10*3/uL (ref 0.0–0.1)
Basophils Relative: 1 %
Eosinophils Absolute: 0.1 10*3/uL (ref 0.0–0.5)
Eosinophils Relative: 2 %
HCT: 39.6 % (ref 36.0–46.0)
Hemoglobin: 14.1 g/dL (ref 12.0–15.0)
Immature Granulocytes: 0 %
Lymphocytes Relative: 28 %
Lymphs Abs: 1.3 10*3/uL (ref 0.7–4.0)
MCH: 31.9 pg (ref 26.0–34.0)
MCHC: 35.6 g/dL (ref 30.0–36.0)
MCV: 89.6 fL (ref 80.0–100.0)
Monocytes Absolute: 0.3 10*3/uL (ref 0.1–1.0)
Monocytes Relative: 7 %
Neutro Abs: 2.8 10*3/uL (ref 1.7–7.7)
Neutrophils Relative %: 62 %
Platelets: 142 10*3/uL — ABNORMAL LOW (ref 150–400)
RBC: 4.42 MIL/uL (ref 3.87–5.11)
RDW: 12.7 % (ref 11.5–15.5)
WBC: 4.6 10*3/uL (ref 4.0–10.5)
nRBC: 0 % (ref 0.0–0.2)

## 2020-04-10 LAB — BRAIN NATRIURETIC PEPTIDE: B Natriuretic Peptide: 26.6 pg/mL (ref 0.0–100.0)

## 2020-04-10 LAB — RESPIRATORY PANEL BY RT PCR (FLU A&B, COVID)
Influenza A by PCR: NEGATIVE
Influenza B by PCR: NEGATIVE
SARS Coronavirus 2 by RT PCR: NEGATIVE

## 2020-04-10 LAB — COMPREHENSIVE METABOLIC PANEL
ALT: 24 U/L (ref 0–44)
AST: 22 U/L (ref 15–41)
Albumin: 4.1 g/dL (ref 3.5–5.0)
Alkaline Phosphatase: 45 U/L (ref 38–126)
Anion gap: 10 (ref 5–15)
BUN: 10 mg/dL (ref 8–23)
CO2: 27 mmol/L (ref 22–32)
Calcium: 8.9 mg/dL (ref 8.9–10.3)
Chloride: 102 mmol/L (ref 98–111)
Creatinine, Ser: 0.66 mg/dL (ref 0.44–1.00)
GFR calc non Af Amer: >60 mL/min (ref >60)
Glucose, Bld: 117 mg/dL — ABNORMAL HIGH (ref 70–99)
Potassium: 2.9 mmol/L — ABNORMAL LOW (ref 3.5–5.1)
Sodium: 139 mmol/L (ref 135–145)
Total Bilirubin: 1 mg/dL (ref 0.3–1.2)
Total Protein: 6.9 g/dL (ref 6.5–8.1)

## 2020-04-10 LAB — MAGNESIUM: Magnesium: 2.1 mg/dL (ref 1.7–2.4)

## 2020-04-10 LAB — TSH: TSH: 0.616 u[IU]/mL (ref 0.350–4.500)

## 2020-04-10 LAB — D-DIMER, QUANTITATIVE: D-Dimer, Quant: 0.32 ug/mL-FEU (ref 0.00–0.50)

## 2020-04-10 MED ORDER — POTASSIUM CHLORIDE CRYS ER 20 MEQ PO TBCR
40.0000 meq | EXTENDED_RELEASE_TABLET | Freq: Once | ORAL | Status: AC
  Administered 2020-04-10: 40 meq via ORAL
  Filled 2020-04-10: qty 2

## 2020-04-10 MED ORDER — POTASSIUM CHLORIDE ER 10 MEQ PO TBCR: 10.0000 meq | EXTENDED_RELEASE_TABLET | Freq: Every day | ORAL | 0 refills | Status: DC

## 2020-04-10 MED ORDER — POTASSIUM CHLORIDE 10 MEQ/100ML IV SOLN
10.0000 meq | Freq: Once | INTRAVENOUS | Status: AC
  Administered 2020-04-10: 10 meq via INTRAVENOUS
  Filled 2020-04-10: qty 100

## 2020-04-10 NOTE — ED Triage Notes (Signed)
Pt also complains of short of breath with exertion and she did become short of breath walking to the room

## 2020-04-10 NOTE — Discharge Instructions (Signed)
Continue taking home medications as prescribed.  Take potassium pills daily for ht next week.  Follow up with your primary care doctor for recheck of your symptoms and potassium  Return to the ER if you develop severe chest pain, difficulty breathing, or any new, worsening, or concerning symptoms.

## 2020-04-10 NOTE — ED Provider Notes (Signed)
[facility] DEPT Provider Note   CSN: [id number] Arrival date & time: 04/10/20  8786     History Chief Complaint  Patient presents with  . Dizziness  . Palpitations    Jodi [name] is a 69 y.o. female presenting for evaluation of shortness of breath and palpitations.   Pt states 1 hr PTA she went to the bathroom and then laid back down, and felt her heart racing. She tried to calm herself down with breathing techniques, but she continued to feel palpitations. She reports recently she has had increased SOB with exertion and talking. She becomes winded faster, especially with an incline. She reports synthroid dose was changed ~1 month ago. Pt was started on a new BP med several wks ago, feels she has had more issues with orthostatic hypotension since. She stopped her sartan PB med 1 wk ago, has had no change in sxs. She denies recent fevers, chills, CP, cough, n/v, and pain, urinary sxs, abnormal BMs. She denies change in leg swelling (reports mild swelling at baseline), leg pain. No recent travel, surgeries, immobilization, h/o cancer, h/o previous dvt/pe, or hormone use. She denies change in diet or starting supplements. She reports decreasing caffeine intake recently.   Additional history obtained from chart review.  Patient with a history of anxiety, diverticulosis, hypertension, IBS, restless leg, sleep apnea, hypothyroidism   HPI     Past Medical History:  Diagnosis Date  . Abnormal Pap smear of cervix   . Anxiety   . Aortic atherosclerosis ([place])   . Diverticulitis   . Fatty liver disease, nonalcoholic   . Glaucoma   . H/O dizziness   . Hemorrhoids   . History of depression    incest survivor/dysthymia  . Hypertension   . Hyperthyroidism   . IBS (irritable bowel syndrome)    lactose intolerance  . Restless leg syndrome   . Sleep apnea   . Snoring 11/17/2012  . Thyroid disease    hypothyroid  . Varicose veins     Patient Active  Problem List   Diagnosis Date Noted  . Spider veins 11/23/2019  . Edema of both legs 11/23/2019  . 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 Anxiety disorder 01/04/2015  . Essential (primary) hypertension 01/04/2015  . Hypothyroidism 01/04/2015  . Pure hypercholesterolemia 01/04/2015  . Right upper quadrant pain 01/01/2015  . Vertigo, labyrinthine 11/17/2012  . Snoring 11/17/2012    Past Surgical History:  Procedure Laterality Date  . CHOLECYSTECTOMY  07/19/2017   Dr. Donne Hazel  . COLPOSCOPY  99, 00, 02, 04, 06  . ENDOVENOUS ABLATION SAPHENOUS VEIN W/ LASER Right 04-08-2015   endovenous laser ablation (right greater saphenous vein) by Victorino Dike MD    . LEEP  3/09   CIN II/III     OB History    Gravida  1   Para  1   Term      Preterm      AB      Living  1     SAB      TAB      Ectopic      Multiple      Live Births              Family History  Problem Relation Age of Onset  . Hypertension Father   . Diabetes Father   . Dementia Father   . Hyperlipidemia Father   . Prostate cancer Father   .  Colon polyps Father   . Parkinson's disease Mother   . Dementia Mother   . Hypertension Mother   . Breast cancer Cousin        maternal side of family  . Dementia Brother   . Colon polyps Brother   . Colon cancer Neg Hx   . Esophageal cancer Neg Hx   . Stomach cancer Neg Hx   . Liver disease Neg Hx   . Pancreatic cancer Neg Hx     Social History   Tobacco Use  . Smoking status: Former Smoker    Quit date: 09/21/1978    Years since quitting: 41.5  . Smokeless tobacco: Never Used  . Tobacco comment: quit 30 years ago  Vaping Use  . Vaping Use: Never used  Substance Use Topics  . Alcohol use: Not Currently    Alcohol/week: 0.0 standard drinks  . Drug use: No    Home Medications Prior to Admission medications   Medication Sig Start Date End Date Taking? Authorizing Provider  Bismuth  Subsalicylate (PEPTO-BISMOL PO) Take by mouth as needed.    [provider]  EPINEPHrine 0.3 mg/0.3 mL IJ SOAJ injection Inject 0.3 mg into the muscle as needed for anaphylaxis.  02/21/16   [provider]  fluticasone (FLONASE) 50 MCG/ACT nasal spray Place 2 sprays into both nostrils daily as needed for allergies.  04/17/19   [provider]  hydrochlorothiazide (HYDRODIURIL) 25 MG tablet Take 25 mg by mouth daily.    [provider]  lactase (LACTAID) 3000 units tablet Take 3,000 Units by mouth as needed (lactaid).     [provider]  levothyroxine (SYNTHROID) 112 MCG tablet Take 112 mcg by mouth daily. 12/25/19   [provider]  Multiple Vitamin (MULTIVITAMIN WITH MINERALS) TABS Take 1 tablet by mouth daily.    [provider]  potassium chloride (KLOR-CON) 10 MEQ tablet Take 1 tablet (10 mEq total) by mouth daily for 7 days. 04/10/20 04/17/20  Desi Rowe, PA-C  saccharomyces boulardii (FLORASTOR) 250 MG capsule Take one tablet BID x 1 month Patient taking differently: Take 250 mg by mouth daily.  12/29/17   Pyrtle, Lajuan Lines, MD  timolol (TIMOPTIC) 0.5 % ophthalmic solution Place 1 drop into both eyes every morning. 12/16/17   [provider]    Allergies    Sulfites, Iodine, Macrodantin [nitrofurantoin macrocrystal], Shellfish allergy, Almond oil, and Bentyl [dicyclomine hcl]  Review of Systems   Review of Systems  Respiratory: Positive for shortness of breath (with exertion).   Cardiovascular: Positive for palpitations.  Neurological: Positive for light-headedness (with change in position).  All other systems reviewed and are negative.   Physical Exam Updated Vital Signs BP (!) 158/89 (BP Location: Right Arm)   Pulse 89   Temp 98 F (36.7 C) (Oral)   Resp 16   LMP 07/06/2010   SpO2 95%   Physical Exam Vitals and nursing note reviewed.  Constitutional:      General: She is not in acute distress.     Appearance: She is well-developed.     Comments: Appears mildly anxious, otherwise nontoxic  HENT:     Head: Normocephalic and atraumatic.  Eyes:     Extraocular Movements: Extraocular movements intact.     Conjunctiva/sclera: Conjunctivae normal.     Pupils: Pupils are equal, round, and reactive to light.  Cardiovascular:     Rate and Rhythm: Regular rhythm.     Pulses: Normal pulses.     Comments: Heart  rate high normal, ranging from 95 and 105. Pulmonary:     Effort: Pulmonary effort is normal. No respiratory distress.     Breath sounds: Normal breath sounds. No wheezing.     Comments: Speaking in full sentences.  Clear lung sounds in all fields.  Sats stable on room air. Abdominal:     General: There is no distension.     Palpations: Abdomen is soft. There is no mass.     Tenderness: There is no abdominal tenderness. There is no guarding or rebound.  Musculoskeletal:        General: Normal range of motion.     Cervical back: Normal range of motion and neck supple.     Right lower leg: Edema present.     Left lower leg: Edema present.     Comments: Mild bilateral edema, baseline per patient.  Pedal pulses 2+ bilaterally.  Skin:    General: Skin is warm and dry.     Capillary Refill: Capillary refill takes less than 2 seconds.  Neurological:     Mental Status: She is alert and oriented to person, place, and time.     ED Results / Procedures / Treatments   Labs (all labs ordered are listed, but only abnormal results are displayed) Labs Reviewed  CBC WITH DIFFERENTIAL/PLATELET - Abnormal; Notable for the following components:      Result Value   Platelets 142 (*)    All other components within normal limits  COMPREHENSIVE METABOLIC PANEL - Abnormal; Notable for the following components:   Potassium 2.9 (*)    Glucose, Bld 117 (*)    All other components within normal limits  RESPIRATORY PANEL BY RT PCR (FLU A&B, COVID)  MAGNESIUM  TSH  D-DIMER, QUANTITATIVE (NOT AT  [facility])  BRAIN NATRIURETIC PEPTIDE    EKG EKG Interpretation  Date/Time:  Wednesday April 10 2020 08:36:30 EDT Ventricular Rate:  83 PR Interval:    QRS Duration: 95 QT Interval:  372 QTC Calculation: 438 R Axis:   73 Text Interpretation: Sinus rhythm no acute ST/T changes similar to [place] 2021 Confirmed by Sherwood Gambler [phone]) on 04/10/2020 9:00:28 AM   Radiology DG Chest 2 View  Result Date: 04/10/2020 CLINICAL DATA:  Shortness of breath EXAM: CHEST - 2 VIEW COMPARISON:  2018 FINDINGS: The heart size and mediastinal contours are within normal limits. Both lungs are clear. No pleural effusion or pneumothorax. The visualized skeletal structures are unremarkable. IMPRESSION: No acute process in the chest. Electronically Signed   By: Macy Mis M.D.   On: 04/10/2020 07:53    Procedures Procedures (including critical care time)  Medications Ordered in ED Medications  potassium chloride 10 mEq in 100 mL IVPB (10 mEq Intravenous New Bag/Given 04/10/20 0907)  potassium chloride SA (KLOR-CON) CR tablet 40 mEq (40 mEq Oral Given 04/10/20 0914)    ED Course  I have reviewed the triage vital signs and the nursing notes.  Pertinent labs & imaging results that were available during my care of the patient were reviewed by me and considered in my medical decision making (see chart for details).    MDM Rules/Calculators/A&P                          Patient presenting for evaluation of palpitations.  Also reporting increased shortness of breath with exertion and lightheadedness with change in position over the past several days to weeks.  On exam, patient appears nontoxic.  Pulmonary  exam is overall reassuring.  Heart rate high normal, regular.  Consider arrhythmia infection versus PE versus CHF versus electrolyte abnormality versus thyroid issue.  Also consider anxiety.  Will obtain labs, EKG, chest x-ray, and reassess.  Chest x-ray viewed interpreted by me, no pneumonia pneumothorax  or effusion, cardiomegaly.  Labs overall reassuring, mild hypokalemia of 2.9.  Will replenish in the ED.  Otherwise electrolytes are stable.  D-dimer is negative. TSH normal. EKG nonischemic, unchanged from previous. BNP normal. Orthostatics show mild elevation in HR when standing, but no change in BP. Consider dehydration, pt states she does not drink a lot of water. discussed findings with pt. Discussed potassium supplementation and close f/u with PCP. At this time, pt appears safe for d/c. Return precautions given. Pt states she understands and agrees to plan.   Final Clinical Impression(s) / ED Diagnoses Final diagnoses:  Hypokalemia  Palpitations  Dyspnea on exertion    Rx / DC Orders ED Discharge Orders         Ordered    potassium chloride (KLOR-CON) 10 MEQ tablet  Daily        04/10/20 0929           Franchot Heidelberg, PA-C 04/10/20 0932    Sherwood Gambler, MD 04/10/20 1153

## 2020-04-10 NOTE — ED Triage Notes (Signed)
Pt complains of a fast heartbeat and feeling lightheaded She states that she has decreased her blood pressure meds Pt also states that she has anxiety but was concerned because she just got placed on BP med

## 2020-04-10 NOTE — ED Notes (Signed)
Patient transported to X-ray 

## 2020-04-18 ENCOUNTER — Ambulatory Visit: Payer: Medicare Other | Attending: Family Medicine | Admitting: Physical Therapy

## 2020-04-18 ENCOUNTER — Other Ambulatory Visit: Payer: Self-pay

## 2020-04-18 ENCOUNTER — Encounter: Payer: Self-pay | Admitting: Physical Therapy

## 2020-04-18 DIAGNOSIS — R42 Dizziness and giddiness: Secondary | ICD-10-CM | POA: Diagnosis present

## 2020-04-18 DIAGNOSIS — R2681 Unsteadiness on feet: Secondary | ICD-10-CM | POA: Diagnosis present

## 2020-04-19 NOTE — Therapy (Addendum)
[facility] [address] [place], [place], [address] Phone: [phone]   Fax:  [phone]  Physical Therapy Evaluation  Patient Details  Name: Jodi [name] MRN: [id number] Date of Birth: 11/10/1950 Referring Provider (PT): Carol Ada, MD   Encounter Date: 04/18/2020   PT End of Session - 04/19/20 1837    Visit Number 1    Number of Visits 4    Date for PT Re-Evaluation 05/10/20    Authorization Type [employer] Medicare    Authorization Time Period 04-18-20 - 06-18-20    PT Start Time 0845    PT Stop Time 0930    PT Time Calculation (min) 45 min    Activity Tolerance Patient tolerated treatment well    Behavior During Therapy [facility] for tasks assessed/performed           Past Medical History:  Diagnosis Date  . Abnormal Pap smear of cervix   . Anxiety   . Aortic atherosclerosis ([place])   . Diverticulitis   . Fatty liver disease, nonalcoholic   . Glaucoma   . H/O dizziness   . Hemorrhoids   . History of depression    incest survivor/dysthymia  . Hypertension   . Hyperthyroidism   . IBS (irritable bowel syndrome)    lactose intolerance  . Restless leg syndrome   . Sleep apnea   . Snoring 11/17/2012  . Thyroid disease    hypothyroid  . Varicose veins     Past Surgical History:  Procedure Laterality Date  . CHOLECYSTECTOMY  07/19/2017   Dr. Donne Hazel  . COLPOSCOPY  99, 00, 02, 04, 06  . ENDOVENOUS ABLATION SAPHENOUS VEIN W/ LASER Right 04-08-2015   endovenous laser ablation (right greater saphenous vein) by Victorino Dike MD    . LEEP  3/09   CIN II/III    There were no vitals filed for this visit.        [facility] PT Assessment - 04/19/20 0001      Assessment   Medical Diagnosis Vertigo    Referring Provider (PT) Carol Ada, MD    Onset Date/Surgical Date 03/28/20      Precautions   Precautions None      Balance Screen   Has the patient fallen in the past 6 months No    Has the patient had  a decrease in activity level because of a fear of falling?  No    Is the patient reluctant to leave their home because of a fear of falling?  No      Functional Gait  Assessment   Gait assessed  Yes    Gait Level Surface Walks 20 ft in less than 5.5 sec, no assistive devices, good speed, no evidence for imbalance, normal gait pattern, deviates no more than 6 in outside of the 12 in walkway width.    Change in Gait Speed Able to smoothly change walking speed without loss of balance or gait deviation. Deviate no more than 6 in outside of the 12 in walkway width.    Gait with Horizontal Head Turns Performs head turns smoothly with no change in gait. Deviates no more than 6 in outside 12 in walkway width    Gait with Vertical Head Turns Performs head turns with no change in gait. Deviates no more than 6 in outside 12 in walkway width.    Gait and Pivot Turn Pivot turns safely within 3 sec and stops quickly with no loss of balance.  Step Over Obstacle Is able to step over 2 stacked shoe boxes taped together (9 in total height) without changing gait speed. No evidence of imbalance.    Gait with Narrow Base of Support Is able to ambulate for 10 steps heel to toe with no staggering.    Gait with Eyes Closed Walks 20 ft, uses assistive device, slower speed, mild gait deviations, deviates 6-10 in outside 12 in walkway width. Ambulates 20 ft in less than 9 sec but greater than 7 sec.    Ambulating Backwards Walks 20 ft, uses assistive device, slower speed, mild gait deviations, deviates 6-10 in outside 12 in walkway width.    Steps Alternating feet, no rail.    Total Score 28            FOTO;  Pt's physical functional status primary measure 65/100;  Risk adjusted 61/100  DHI - score 40% (moderate handicap)           Objective measurements completed on examination: See above findings.               PT Education - 04/19/20 1836    Education Details eval results - plan to redo SOT  and compare with previous test score 2 yrs ago    Person(s) Educated Patient    Methods Explanation    Comprehension Verbalized understanding               PT Long Term Goals - 04/19/20 1845      PT LONG TERM GOAL #1   Title Independent in HEP for balance and vestibular exercises.    Time 4    Period Weeks    Status New    Target Date 05/10/20      PT LONG TERM GOAL #2   Title Pt will improve DHI score by at least 10 points to indicate improvement in status/vertigo.    Time 4    Period Weeks    Status New    Target Date 05/10/20                  Plan - 04/19/20 1838    Clinical Impression Statement Pt presents with very high level balance and gait deficits with pt scoring 28/30 on FGA.  Pt reports no vertigo at this time, only mild unsteadiness with quick turns and occasionally with walking down a sidewalk.  Pt states vertigo has improved since ED visit on 03-28-20 and only has mild residual balance deficits since this episode of BPPV.  Pt will benefit from PT for HEP instruction based on results of SOT (to be completed next session).    Personal Factors and Comorbidities Past/Current Experience;Comorbidity 1    Comorbidities h/o vestibular hypofunction, BPPV, hypokalemia    Examination-Activity Limitations Locomotion Level    Examination-Participation Restrictions Cleaning;Community Activity;Meal Prep;Shop    Stability/Clinical Decision Making Stable/Uncomplicated    Clinical Decision Making Low    Rehab Potential Good    PT Frequency 1x / week    PT Duration 4 weeks    PT Treatment/Interventions ADLs/Self Care Home Management;Vestibular;Neuromuscular re-education;Balance training;Patient/family education;Gait training;Therapeutic activities;Therapeutic exercise    PT Next Visit Plan do SOT; begin HEP as appropriate    Consulted and Agree with Plan of Care Patient           Patient will benefit from skilled therapeutic intervention in order to improve the  following deficits and impairments:  Dizziness, Difficulty walking, Decreased balance  Visit Diagnosis: Dizziness and giddiness - Plan: PT  plan of care cert/re-cert  Unsteadiness on feet - Plan: PT plan of care cert/re-cert     Problem List Patient Active Problem List   Diagnosis Date Noted  . Spider veins 11/23/2019  . Edema of both legs 11/23/2019  . 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 Anxiety disorder 01/04/2015  . Essential (primary) hypertension 01/04/2015  . Hypothyroidism 01/04/2015  . Pure hypercholesterolemia 01/04/2015  . Right upper quadrant pain 01/01/2015  . Vertigo, labyrinthine 11/17/2012  . Snoring 11/17/2012    [place], PT 04/19/2020, 6:53 PM  [facility] [address] [place], [place], [address] Phone: [phone]   Fax:  [phone]  Name: Jodi [name] MRN: [id number] Date of Birth: [birth year]

## 2020-04-22 ENCOUNTER — Encounter: Payer: Self-pay | Admitting: Physical Therapy

## 2020-04-23 ENCOUNTER — Emergency Department (HOSPITAL_COMMUNITY)
Admission: EM | Admit: 2020-04-23 | Discharge: 2020-04-23 | Disposition: A | Discharge status: Home / Self Care | Payer: Medicare Other | Attending: Emergency Medicine | Admitting: Emergency Medicine

## 2020-04-23 ENCOUNTER — Other Ambulatory Visit: Payer: Self-pay

## 2020-04-23 ENCOUNTER — Emergency Department (HOSPITAL_COMMUNITY): Payer: Medicare Other

## 2020-04-23 ENCOUNTER — Encounter (HOSPITAL_COMMUNITY): Payer: Self-pay | Admitting: Emergency Medicine

## 2020-04-23 DIAGNOSIS — R002 Palpitations: Secondary | ICD-10-CM | POA: Insufficient documentation

## 2020-04-23 DIAGNOSIS — R42 Dizziness and giddiness: Secondary | ICD-10-CM | POA: Insufficient documentation

## 2020-04-23 DIAGNOSIS — I10 Essential (primary) hypertension: Secondary | ICD-10-CM | POA: Insufficient documentation

## 2020-04-23 DIAGNOSIS — E039 Hypothyroidism, unspecified: Secondary | ICD-10-CM | POA: Diagnosis not present

## 2020-04-23 DIAGNOSIS — E876 Hypokalemia: Secondary | ICD-10-CM | POA: Diagnosis not present

## 2020-04-23 DIAGNOSIS — Z79899 Other long term (current) drug therapy: Secondary | ICD-10-CM | POA: Diagnosis not present

## 2020-04-23 DIAGNOSIS — R0602 Shortness of breath: Secondary | ICD-10-CM

## 2020-04-23 DIAGNOSIS — Z87891 Personal history of nicotine dependence: Secondary | ICD-10-CM | POA: Diagnosis not present

## 2020-04-23 LAB — COMPREHENSIVE METABOLIC PANEL
ALT: 27 U/L (ref 0–44)
AST: 22 U/L (ref 15–41)
Albumin: 4.3 g/dL (ref 3.5–5.0)
Alkaline Phosphatase: 47 U/L (ref 38–126)
Anion gap: 9 (ref 5–15)
BUN: 9 mg/dL (ref 8–23)
CO2: 27 mmol/L (ref 22–32)
Calcium: 9.2 mg/dL (ref 8.9–10.3)
Chloride: 103 mmol/L (ref 98–111)
Creatinine, Ser: 0.65 mg/dL (ref 0.44–1.00)
GFR, Estimated: >60 mL/min (ref >60)
Glucose, Bld: 129 mg/dL — ABNORMAL HIGH (ref 70–99)
Potassium: 3.2 mmol/L — ABNORMAL LOW (ref 3.5–5.1)
Sodium: 139 mmol/L (ref 135–145)
Total Bilirubin: 0.9 mg/dL (ref 0.3–1.2)
Total Protein: 7.3 g/dL (ref 6.5–8.1)

## 2020-04-23 LAB — CBC WITH DIFFERENTIAL/PLATELET
Abs Immature Granulocytes: 0 10*3/uL (ref 0.00–0.07)
Basophils Absolute: 0 10*3/uL (ref 0.0–0.1)
Basophils Relative: 1 %
Eosinophils Absolute: 0.1 10*3/uL (ref 0.0–0.5)
Eosinophils Relative: 2 %
HCT: 41.4 % (ref 36.0–46.0)
Hemoglobin: 14.4 g/dL (ref 12.0–15.0)
Immature Granulocytes: 0 %
Lymphocytes Relative: 23 %
Lymphs Abs: 1.1 10*3/uL (ref 0.7–4.0)
MCH: 31.6 pg (ref 26.0–34.0)
MCHC: 34.8 g/dL (ref 30.0–36.0)
MCV: 90.8 fL (ref 80.0–100.0)
Monocytes Absolute: 0.4 10*3/uL (ref 0.1–1.0)
Monocytes Relative: 8 %
Neutro Abs: 3.1 10*3/uL (ref 1.7–7.7)
Neutrophils Relative %: 66 %
Platelets: 158 10*3/uL (ref 150–400)
RBC: 4.56 MIL/uL (ref 3.87–5.11)
RDW: 13.3 % (ref 11.5–15.5)
WBC: 4.7 10*3/uL (ref 4.0–10.5)
nRBC: 0 % (ref 0.0–0.2)

## 2020-04-23 LAB — BRAIN NATRIURETIC PEPTIDE: B Natriuretic Peptide: 21.5 pg/mL (ref 0.0–100.0)

## 2020-04-23 LAB — TROPONIN I (HIGH SENSITIVITY)
Troponin I (High Sensitivity): 2 ng/L (ref <18)
Troponin I (High Sensitivity): 4 ng/L (ref <18)

## 2020-04-23 MED ORDER — IOHEXOL 350 MG/ML SOLN
100.0000 mL | Freq: Once | INTRAVENOUS | Status: AC | PRN
  Administered 2020-04-23: 100 mL via INTRAVENOUS

## 2020-04-23 NOTE — Discharge Instructions (Signed)
Schedule appointment with cardiology.  Also make an appointment to follow-up with your regular doctor for follow-up of the low potassium.  Recommend that for the next 3 days that you take 2 of your potassium tablets instead of 1.  Would recommend taking 1 in the morning and 1 in the evening.  Return for any new or worse symptoms.  Today's work-up to include heart enzymes CT angio chest without any acute findings.  No evidence of any cardiac arrhythmia on heart monitoring today.

## 2020-04-23 NOTE — ED Provider Notes (Signed)
[facility] DEPT Provider Note   CSN: [id number] Arrival date & time: 04/23/20  3762     History Chief Complaint  Patient presents with   Shortness of Breath    Jodi [name] is a 69 y.o. female.  Patient seen October 6 for similar complaints of some lightheadedness palpitations some exertional shortness of breath.  Work-up at that time found some hypokalemia patient's potassium was replaced and she did say that she went on to feel better.  Not sure how high her potassium got but she did follow-up with her primary care doctor.  But that symptoms are back.  And the symptoms have been ongoing now for a few weeks.  She gets short of breath she is feeling kind of palpitations rapid heart rate.  Patient was referred by her primary care doctor to cardiology but has not seen them yet.  No true syncope.  Patient has a past history of vertigo patient states is not like that at all.  There is lightheadedness but no true dizziness.  No room spinning.  No chest pain.  No fever no cough.  Patient's work-up on the 6 included a negative D-dimer as well as negative BNP.        Past Medical History:  Diagnosis Date   Abnormal Pap smear of cervix    Anxiety    Aortic atherosclerosis (HCC)    Diverticulitis    Fatty liver disease, nonalcoholic    Glaucoma    H/O dizziness    Hemorrhoids    History of depression    incest survivor/dysthymia   Hypertension    Hyperthyroidism    IBS (irritable bowel syndrome)    lactose intolerance   Restless leg syndrome    Sleep apnea    Snoring 11/17/2012   Thyroid disease    hypothyroid   Varicose veins     Patient Active Problem List   Diagnosis Date Noted   Spider veins 11/23/2019   Edema of both legs 11/23/2019   CIN III (cervical intraepithelial neoplasia grade III) with severe dysplasia 03/05/2017   Anxiety disorder 01/04/2015   Essential (primary) hypertension 01/04/2015    Hypothyroidism 01/04/2015   Pure hypercholesterolemia 01/04/2015   Right upper quadrant pain 01/01/2015   Vertigo, labyrinthine 11/17/2012   Snoring 11/17/2012    Past Surgical History:  Procedure Laterality Date   CHOLECYSTECTOMY  07/19/2017   Dr. Donne Hazel   COLPOSCOPY  99, 00, 02, 04, 06   ENDOVENOUS ABLATION SAPHENOUS VEIN W/ LASER Right 04-08-2015   endovenous laser ablation (right greater saphenous vein) by Victorino Dike MD     LEEP  3/09   CIN II/III     OB History    Gravida  1   Para  1   Term      Preterm      AB      Living  1     SAB      TAB      Ectopic      Multiple      Live Births              Family History  Problem Relation Age of Onset   Hypertension Father    Diabetes Father    Dementia Father    Hyperlipidemia Father    Prostate cancer Father    Colon polyps Father    Parkinson's disease Mother    Dementia Mother    Hypertension Mother    Breast cancer Cousin  maternal side of family   Dementia Brother    Colon polyps Brother    Colon cancer Neg Hx    Esophageal cancer Neg Hx    Stomach cancer Neg Hx    Liver disease Neg Hx    Pancreatic cancer Neg Hx     Social History   Tobacco Use   Smoking status: Former Smoker    Quit date: 09/21/1978    Years since quitting: 41.6   Smokeless tobacco: Never Used   Tobacco comment: quit 30 years ago  Vaping Use   Vaping Use: Never used  Substance Use Topics   Alcohol use: Not Currently    Alcohol/week: 0.0 standard drinks   Drug use: No    Home Medications Prior to Admission medications   Medication Sig Start Date End Date Taking? Authorizing Provider  ALPRAZolam (XANAX) 0.25 MG tablet Take 0.125 mg by mouth every 6 (six) hours as needed for anxiety.   Yes [provider]  bismuth subsalicylate (PEPTO BISMOL) 262 MG chewable tablet Chew 524 mg by mouth as needed for diarrhea or loose stools.   Yes [provider]   fluticasone (FLONASE) 50 MCG/ACT nasal spray Place 2 sprays into both nostrils daily as needed for allergies.  04/17/19  Yes [provider]  hydrochlorothiazide (HYDRODIURIL) 25 MG tablet Take 25 mg by mouth daily.   Yes [provider]  hydrocortisone (ANUSOL-HC) 2.5 % rectal cream Place 1 application rectally 4 (four) times daily as needed for hemorrhoids or anal itching.   Yes [provider]  lactase (LACTAID) 3000 units tablet Take 3,000 Units by mouth as needed (lactaid).    Yes [provider]  levothyroxine (SYNTHROID) 112 MCG tablet Take 112 mcg by mouth daily. 12/25/19  Yes [provider]  Multiple Vitamin (MULTIVITAMIN WITH MINERALS) TABS Take 1 tablet by mouth daily.   Yes [provider]  potassium chloride (KLOR-CON) 10 MEQ tablet Take 1 tablet (10 mEq total) by mouth daily for 7 days. 04/10/20 04/23/20 Yes Caccavale, Sophia, PA-C  saccharomyces boulardii (FLORASTOR) 250 MG capsule Take one tablet BID x 1 month Patient taking differently: Take 250 mg by mouth daily.  12/29/17  Yes Pyrtle, Lajuan Lines, MD  timolol (TIMOPTIC) 0.5 % ophthalmic solution Place 1 drop into both eyes every morning. 12/16/17  Yes [provider]  Vitamins A & D (VITAMIN A & D) ointment Apply 1 application topically as needed (hemmroids).   Yes [provider]  EPINEPHrine 0.3 mg/0.3 mL IJ SOAJ injection Inject 0.3 mg into the muscle as needed for anaphylaxis.  02/21/16   [provider]    Allergies    Sulfites, Iodine, Macrodantin [nitrofurantoin macrocrystal], Shellfish allergy, Almond oil, and Bentyl [dicyclomine hcl]  Review of Systems   Review of Systems  Constitutional: Negative for chills and fever.  HENT: Negative for congestion, rhinorrhea and sore throat.   Eyes: Negative for visual disturbance.  Respiratory: Positive for shortness of breath. Negative for cough.   Cardiovascular: Positive for palpitations. Negative for  chest pain and leg swelling.  Gastrointestinal: Negative for abdominal pain, diarrhea, nausea and vomiting.  Genitourinary: Negative for dysuria.  Musculoskeletal: Negative for back pain and neck pain.  Skin: Negative for rash.  Neurological: Positive for light-headedness. Negative for dizziness and headaches.  Hematological: Does not bruise/bleed easily.  Psychiatric/Behavioral: Negative for confusion.    Physical Exam Updated Vital Signs BP 139/83    Pulse 84    Temp 98.8 F (37.1 C) (Oral)  Resp 20    Ht 1.664 m (5' 5.5")    Wt 82.6 kg    LMP 07/06/2010    SpO2 96%    BMI 29.83 kg/m   Physical Exam Vitals and nursing note reviewed.  Constitutional:      General: She is not in acute distress.    Appearance: Normal appearance. She is well-developed.  HENT:     Head: Normocephalic and atraumatic.  Eyes:     Extraocular Movements: Extraocular movements intact.     Conjunctiva/sclera: Conjunctivae normal.     Pupils: Pupils are equal, round, and reactive to light.  Cardiovascular:     Rate and Rhythm: Normal rate and regular rhythm.     Heart sounds: No murmur heard.   Pulmonary:     Effort: Pulmonary effort is normal. No respiratory distress.     Breath sounds: Normal breath sounds.  Abdominal:     Palpations: Abdomen is soft.     Tenderness: There is no abdominal tenderness.  Musculoskeletal:        General: No swelling. Normal range of motion.     Cervical back: Normal range of motion and neck supple.  Skin:    General: Skin is warm and dry.  Neurological:     General: No focal deficit present.     Mental Status: She is alert and oriented to person, place, and time.     Cranial Nerves: No cranial nerve deficit.     Sensory: No sensory deficit.     Motor: No weakness.     ED Results / Procedures / Treatments   Labs (all labs ordered are listed, but only abnormal results are displayed) Labs Reviewed  COMPREHENSIVE METABOLIC PANEL - Abnormal; Notable for the  following components:      Result Value   Potassium 3.2 (*)    Glucose, Bld 129 (*)    All other components within normal limits  CBC WITH DIFFERENTIAL/PLATELET  BRAIN NATRIURETIC PEPTIDE  TROPONIN I (HIGH SENSITIVITY)  TROPONIN I (HIGH SENSITIVITY)    EKG EKG Interpretation  Date/Time:  Tuesday April 23 2020 05:22:45 EDT Ventricular Rate:  96 PR Interval:    QRS Duration: 80 QT Interval:  348 QTC Calculation: 440 R Axis:   92 Text Interpretation: Sinus rhythm LAE, consider biatrial enlargement Right axis deviation Low voltage, precordial leads Probable anteroseptal infarct, old When compared with ECG of 04/10/2020, No significant change was found Confirmed by Delora Fuel ([id number]) on 04/23/2020 5:42:52 AM   Radiology DG Chest 2 View  Result Date: 04/23/2020 CLINICAL DATA:  Shortness of breath on exertion EXAM: CHEST - 2 VIEW COMPARISON:  04/10/2020 FINDINGS: Mild hyperinflation. Normal heart size and pulmonary vascularity. No focal airspace disease or consolidation in the lungs. No blunting of costophrenic angles. No pneumothorax. Mediastinal contours appear intact. Calcification of the aorta. IMPRESSION: No active cardiopulmonary disease. Electronically Signed   By: Lucienne Capers M.D.   On: 04/23/2020 06:14   CT Angio Chest PE W/Cm &/Or Wo Cm  Result Date: 04/23/2020 CLINICAL DATA:  Shortness of breath on exertion EXAM: CT ANGIOGRAPHY CHEST WITH CONTRAST TECHNIQUE: Multidetector CT imaging of the chest was performed using the standard protocol during bolus administration of intravenous contrast. Multiplanar CT image reconstructions and MIPs were obtained to evaluate the vascular anatomy. CONTRAST:  169mL OMNIPAQUE IOHEXOL 350 MG/ML SOLN COMPARISON:  Chest x-ray 04/23/2020.  CT abdomen pelvis 12/28/2017 FINDINGS: Cardiovascular: Satisfactory opacification of the pulmonary arteries to the segmental level. No evidence of pulmonary embolism.  Thoracic aorta is normal in course and  caliber. Scattered atherosclerotic calcifications of the thoracic aorta. Normal heart size. No pericardial effusion. Mediastinum/Nodes: No enlarged mediastinal, hilar, or axillary lymph nodes. Thyroid gland, trachea, and esophagus demonstrate no significant findings. Lungs/Pleura: Lungs are clear. No pleural effusion or pneumothorax. Upper Abdomen: Stable 1.1 cm fluid attenuation lesion within the left hepatic lobe compatible with cyst. No acute findings within the visualized portion of the upper abdomen. Musculoskeletal: No chest wall abnormality. No acute or significant osseous findings. Review of the MIP images confirms the above findings. IMPRESSION: 1. Negative for pulmonary embolus. 2. Lungs are clear. 3. Aortic atherosclerosis. (ICD10-I70.0). Electronically Signed   By: Davina Poke D.O.   On: 04/23/2020 08:39    Procedures Procedures (including critical care time)  Medications Ordered in ED Medications  iohexol (OMNIPAQUE) 350 MG/ML injection 100 mL (100 mLs Intravenous Contrast Given 04/23/20 0820)    ED Course  I have reviewed the triage vital signs and the nursing notes.  Pertinent labs & imaging results that were available during my care of the patient were reviewed by me and considered in my medical decision making (see chart for details).    MDM Rules/Calculators/A&P                         Based on the persistent symptoms which may all be secondary to palpitations or heart arrhythmia that were not seen on monitoring.  Decided to take a little deeper look into her symptoms.  Potassium today was 3.2.  Which may explain her symptoms.  Troponins x2 were normal.  Chest x-ray again was normal.  So did CT angio chest without any evidence of pulmonary embolus or any other acute cardiopulmonary findings.  Patient's BMP again was normal.  EKG without any changes cardiac monitoring without any arrhythmias.  I think patient stable for discharge home follow-up with cardiology additional  cardiology referral information provided.  And follow back up with her primary care doctor for have potassium rechecked.  Since patient is already on potassium supplementation 1 a day recommending that she take 2 a day for the next 3 days.  And then follow back up with her primary care doctor.  Her renal function is normal and potassium had drifted back down to 3.2.  Patient thinks it got as high as 3.5.  Patient is on 10 mEq so we will have her take 20 for the next 3 days.  And patient is on hydrochlorothiazide this may be responsible for the potassium getting low.  Not sure whether this is the cause of the symptoms but if we get her potassium into a more normal range and the symptoms resolve then this may be the cause.  Will very important follow-up with cardiology.  Final Clinical Impression(s) / ED Diagnoses Final diagnoses:  SOB (shortness of breath)  Palpitations  Hypokalemia    Rx / DC Orders ED Discharge Orders    None       Fredia Sorrow, MD 04/23/20 1125

## 2020-04-23 NOTE — ED Triage Notes (Signed)
Patient is complaining of shortness of breath after she got up to go use the restroom. She says that she has sob when she exert herself. Patient had this two weeks ago.

## 2020-04-25 ENCOUNTER — Ambulatory Visit: Payer: Medicare Other | Admitting: Physical Therapy

## 2020-04-27 ENCOUNTER — Encounter (HOSPITAL_COMMUNITY): Payer: Self-pay | Admitting: Emergency Medicine

## 2020-04-27 ENCOUNTER — Other Ambulatory Visit: Payer: Self-pay

## 2020-04-27 ENCOUNTER — Emergency Department (HOSPITAL_COMMUNITY): Payer: Medicare Other

## 2020-04-27 ENCOUNTER — Emergency Department (HOSPITAL_COMMUNITY)
Admission: EM | Admit: 2020-04-27 | Discharge: 2020-04-27 | Disposition: A | Discharge status: Home / Self Care | Payer: Medicare Other | Attending: Emergency Medicine | Admitting: Emergency Medicine

## 2020-04-27 DIAGNOSIS — Z79899 Other long term (current) drug therapy: Secondary | ICD-10-CM | POA: Insufficient documentation

## 2020-04-27 DIAGNOSIS — E039 Hypothyroidism, unspecified: Secondary | ICD-10-CM | POA: Insufficient documentation

## 2020-04-27 DIAGNOSIS — R55 Syncope and collapse: Secondary | ICD-10-CM | POA: Diagnosis not present

## 2020-04-27 DIAGNOSIS — I10 Essential (primary) hypertension: Secondary | ICD-10-CM | POA: Diagnosis not present

## 2020-04-27 DIAGNOSIS — Z87891 Personal history of nicotine dependence: Secondary | ICD-10-CM | POA: Diagnosis not present

## 2020-04-27 DIAGNOSIS — R002 Palpitations: Secondary | ICD-10-CM | POA: Diagnosis present

## 2020-04-27 LAB — TROPONIN I (HIGH SENSITIVITY): Troponin I (High Sensitivity): 3 ng/L (ref <18)

## 2020-04-27 LAB — CBC
HCT: 43.9 % (ref 36.0–46.0)
Hemoglobin: 14.9 g/dL (ref 12.0–15.0)
MCH: 31.6 pg (ref 26.0–34.0)
MCHC: 33.9 g/dL (ref 30.0–36.0)
MCV: 93 fL (ref 80.0–100.0)
Platelets: 175 10*3/uL (ref 150–400)
RBC: 4.72 MIL/uL (ref 3.87–5.11)
RDW: 13.4 % (ref 11.5–15.5)
WBC: 5.8 10*3/uL (ref 4.0–10.5)
nRBC: 0 % (ref 0.0–0.2)

## 2020-04-27 LAB — BASIC METABOLIC PANEL
Anion gap: 8 (ref 5–15)
BUN: 10 mg/dL (ref 8–23)
CO2: 30 mmol/L (ref 22–32)
Calcium: 9.5 mg/dL (ref 8.9–10.3)
Chloride: 100 mmol/L (ref 98–111)
Creatinine, Ser: 0.8 mg/dL (ref 0.44–1.00)
GFR, Estimated: >60 mL/min (ref >60)
Glucose, Bld: 109 mg/dL — ABNORMAL HIGH (ref 70–99)
Potassium: 3.7 mmol/L (ref 3.5–5.1)
Sodium: 138 mmol/L (ref 135–145)

## 2020-04-27 NOTE — Discharge Instructions (Addendum)
Follow up with cardiology on Monday as scheduled. Return to ER for worsening of concerning symptoms.   Your potassium today is 3.7.

## 2020-04-27 NOTE — ED Provider Notes (Signed)
[facility] DEPT Provider Note   CSN: [id number] Arrival date & time: 04/27/20  7322     History Chief Complaint  Patient presents with  . Shortness of Breath  . Near Syncope    Jodi [name] is a 69 y.o. female.  69 year old female with history of hypertension presents with complaint of palpitations. Patient states this is her 4th ER visit this month for same, scheduled to see cardiology on Monday. Patient was standing, talking to her neighbor when she felt like her heart paused for less than a few seconds. In that few seconds, patient felt lightheaded, when the pause resolved, she felt back to baseline. This incident prompted patient to seek care today. Patient states she sleeps propped up on pillows to help with a heart racing sensation she experiences when she first wakes up in the morning. Denies orthopnea or lower extremity edema. States she has been more short of breath since September.  Prior to September, patient walked 10,000 steps daily and gardened regularly. At this point becomes lightly short of breath after climbing a flight of steps and then taking a few steps. Also feels short of breath lifting anything of significant weight. Denies diaphoresis, chest pain. Reports anxiety history and acknowledges her symptoms maybe made worse by her underling anxiety.         Past Medical History:  Diagnosis Date  . Abnormal Pap smear of cervix   . Anxiety   . Aortic atherosclerosis ([place])   . Diverticulitis   . Fatty liver disease, nonalcoholic   . Glaucoma   . H/O dizziness   . Hemorrhoids   . History of depression    incest survivor/dysthymia  . Hypertension   . Hyperthyroidism   . IBS (irritable bowel syndrome)    lactose intolerance  . Restless leg syndrome   . Sleep apnea   . Snoring 11/17/2012  . Thyroid disease    hypothyroid  . Varicose veins     Patient Active Problem List   Diagnosis Date Noted  . Spider veins 11/23/2019  .  Edema of both legs 11/23/2019  . 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 Anxiety disorder 01/04/2015  . Essential (primary) hypertension 01/04/2015  . Hypothyroidism 01/04/2015  . Pure hypercholesterolemia 01/04/2015  . Right upper quadrant pain 01/01/2015  . Vertigo, labyrinthine 11/17/2012  . Snoring 11/17/2012    Past Surgical History:  Procedure Laterality Date  . CHOLECYSTECTOMY  07/19/2017   Dr. Donne Hazel  . COLPOSCOPY  99, 00, 02, 04, 06  . ENDOVENOUS ABLATION SAPHENOUS VEIN W/ LASER Right 04-08-2015   endovenous laser ablation (right greater saphenous vein) by Victorino Dike MD    . LEEP  3/09   CIN II/III     OB History    Gravida  1   Para  1   Term      Preterm      AB      Living  1     SAB      TAB      Ectopic      Multiple      Live Births              Family History  Problem Relation Age of Onset  . Hypertension Father   . Diabetes Father   . Dementia Father   . Hyperlipidemia Father   . Prostate cancer Father   . Colon polyps Father   .  Parkinson's disease Mother   . Dementia Mother   . Hypertension Mother   . Breast cancer Cousin        maternal side of family  . Dementia Brother   . Colon polyps Brother   . Colon cancer Neg Hx   . Esophageal cancer Neg Hx   . Stomach cancer Neg Hx   . Liver disease Neg Hx   . Pancreatic cancer Neg Hx     Social History   Tobacco Use  . Smoking status: Former Smoker    Quit date: 09/21/1978    Years since quitting: 41.6  . Smokeless tobacco: Never Used  . Tobacco comment: quit 30 years ago  Vaping Use  . Vaping Use: Never used  Substance Use Topics  . Alcohol use: Not Currently    Alcohol/week: 0.0 standard drinks  . Drug use: No    Home Medications Prior to Admission medications   Medication Sig Start Date End Date Taking? Authorizing Provider  ALPRAZolam (XANAX) 0.25 MG tablet Take 0.125 mg by mouth every 6 (six) hours as needed  for anxiety.    [provider]  bismuth subsalicylate (PEPTO BISMOL) 262 MG chewable tablet Chew 524 mg by mouth as needed for diarrhea or loose stools.    [provider]  EPINEPHrine 0.3 mg/0.3 mL IJ SOAJ injection Inject 0.3 mg into the muscle as needed for anaphylaxis.  02/21/16   [provider]  fluticasone (FLONASE) 50 MCG/ACT nasal spray Place 2 sprays into both nostrils daily as needed for allergies.  04/17/19   [provider]  hydrochlorothiazide (HYDRODIURIL) 25 MG tablet Take 25 mg by mouth daily.    [provider]  hydrocortisone (ANUSOL-HC) 2.5 % rectal cream Place 1 application rectally 4 (four) times daily as needed for hemorrhoids or anal itching.    [provider]  lactase (LACTAID) 3000 units tablet Take 3,000 Units by mouth as needed (lactaid).     [provider]  levothyroxine (SYNTHROID) 112 MCG tablet Take 112 mcg by mouth daily. 12/25/19   [provider]  Multiple Vitamin (MULTIVITAMIN WITH MINERALS) TABS Take 1 tablet by mouth daily.    [provider]  potassium chloride (KLOR-CON) 10 MEQ tablet Take 1 tablet (10 mEq total) by mouth daily for 7 days. 04/10/20 04/23/20  Caccavale, Sophia, PA-C  saccharomyces boulardii (FLORASTOR) 250 MG capsule Take one tablet BID x 1 month Patient taking differently: Take 250 mg by mouth daily.  12/29/17   Pyrtle, Lajuan Lines, MD  timolol (TIMOPTIC) 0.5 % ophthalmic solution Place 1 drop into both eyes every morning. 12/16/17   [provider]  Vitamins A & D (VITAMIN A & D) ointment Apply 1 application topically as needed (hemmroids).    [provider]    Allergies    Sulfites, Iodine, Macrodantin [nitrofurantoin macrocrystal], Shellfish allergy, Almond oil, and Bentyl [dicyclomine hcl]  Review of Systems   Review of Systems  Constitutional: Negative for chills, diaphoresis, fatigue and fever.  Respiratory: Positive for shortness of breath.    Cardiovascular: Negative for chest pain and leg swelling.  Gastrointestinal: Negative for abdominal pain, nausea and vomiting.  Genitourinary: Negative for dysuria.  Musculoskeletal: Negative for arthralgias and myalgias.  Skin: Negative for rash and wound.  Allergic/Immunologic: Negative for immunocompromised state.  Neurological: Positive for light-headedness. Negative for dizziness, speech difficulty, weakness and headaches.  Psychiatric/Behavioral: Negative for confusion.  All other systems reviewed and are negative.   Physical Exam Updated Vital Signs BP  123/69   Pulse 70   Temp 98.1 F (36.7 C) (Oral)   Resp 16   Ht 5\' 5"  (1.651 m)   Wt 83.5 kg   LMP 07/06/2010   SpO2 95%   BMI 30.62 kg/m   Physical Exam Vitals and nursing note reviewed.  Constitutional:      General: She is not in acute distress.    Appearance: She is well-developed. She is not diaphoretic.  HENT:     Head: Normocephalic and atraumatic.  Cardiovascular:     Rate and Rhythm: Normal rate and regular rhythm.     Pulses: No decreased pulses.  Pulmonary:     Effort: Pulmonary effort is normal.     Breath sounds: Normal breath sounds. No decreased breath sounds.  Chest:     Chest wall: No tenderness.  Abdominal:     Palpations: Abdomen is soft.     Tenderness: There is no abdominal tenderness.  Musculoskeletal:     Right lower leg: No tenderness. No edema.     Left lower leg: No tenderness. No edema.  Skin:    General: Skin is warm and dry.     Findings: No erythema or rash.  Neurological:     Mental Status: She is alert and oriented to person, place, and time.  Psychiatric:        Behavior: Behavior normal.     ED Results / Procedures / Treatments   Labs (all labs ordered are listed, but only abnormal results are displayed) Labs Reviewed  BASIC METABOLIC PANEL - Abnormal; Notable for the following components:      Result Value   Glucose, Bld 109 (*)    All other components within  normal limits  CBC  TROPONIN I (HIGH SENSITIVITY)    EKG None  Radiology DG Chest 2 View  Result Date: 04/27/2020 CLINICAL DATA:  Shortness of breath. EXAM: CHEST - 2 VIEW COMPARISON:  April 23, 2020. FINDINGS: The heart size and mediastinal contours are within normal limits. Both lungs are clear. No pneumothorax or pleural effusion is noted. The visualized skeletal structures are unremarkable. IMPRESSION: No active cardiopulmonary disease. Electronically Signed   By: Marijo Conception M.D.   On: 04/27/2020 10:38    Procedures Procedures (including critical care time)  Medications Ordered in ED Medications - No data to display  ED Course  I have reviewed the triage vital signs and the nursing notes.  Pertinent labs & imaging results that were available during my care of the patient were reviewed by me and considered in my medical decision making (see chart for details).  Clinical Course as of Apr 27 1510  Sat Apr 27, 2020  1225 Lying BP 143/76 Heart rate 78  Sitting BP 128/82 HR 78  Standing BP 115/83 HR 82  Asymptomatic throughout assessment.    [LM]  10159 69 year old female with complaint of feeling like her heart paused causing her to feel lightheaded today. Exam is unremarkable. Orthostating vitals assessed and unremarkable. Labs without significant findings. EKG and care reviewed with Dr. Eulis Foster, no significant or acute findings. Plan is to follow up with cardiology on Monday as currently scheduled.    [LM]    Clinical Course User Index [LM] Roque Lias   MDM Rules/Calculators/A&P                          Final Clinical Impression(s) / ED Diagnoses Final diagnoses:  Near syncope  Rx / DC Orders ED Discharge Orders    None       Roque Lias 04/27/20 1511    Daleen Bo, MD 04/28/20 [phone]

## 2020-04-27 NOTE — ED Triage Notes (Signed)
Pt states that nearly 2 months she has had SOB and heart palpitations and today she had a near syncopal episode. Has had 4 near syncopal episodes but her cardiology appointment is not until next week. Alert and oriented.

## 2020-04-27 NOTE — ED Provider Notes (Signed)
  Face-to-face evaluation   History: She presents for evaluation of intermittent shortness of breath and palpitations, and has been evaluated multiple times including by PCP in ED, x2.  She is due to see a cardiologist for follow-up care.  Physical exam: Alert, calm and cooperative.  No dysarthria or aphasia.  No respiratory distress.  Moving arms and legs normally.  Medical screening examination/treatment/procedure(s) were conducted as a shared visit with non-physician practitioner(s) and myself.  I personally evaluated the patient during the encounter    Date: 04/27/2020  Rate: 90  Rhythm: normal sinus rhythm  QRS Axis: normal  PR and QT Intervals: normal  ST/T Wave abnormalities: normal  PR and QRS Conduction Disutrbances:none      Daleen Bo, MD 04/28/20 [phone]

## 2020-04-29 ENCOUNTER — Encounter: Payer: Self-pay | Admitting: *Deleted

## 2020-04-29 ENCOUNTER — Other Ambulatory Visit: Payer: Self-pay

## 2020-04-29 ENCOUNTER — Encounter: Payer: Self-pay | Admitting: Cardiovascular Disease

## 2020-04-29 ENCOUNTER — Ambulatory Visit: Payer: Medicare Other | Admitting: Cardiovascular Disease

## 2020-04-29 ENCOUNTER — Other Ambulatory Visit (HOSPITAL_COMMUNITY)
Admission: RE | Admit: 2020-04-29 | Discharge: 2020-04-29 | Disposition: A | Discharge status: Home / Self Care | Payer: Medicare Other | Source: Ambulatory Visit | Attending: Cardiovascular Disease | Admitting: Cardiovascular Disease

## 2020-04-29 VITALS — BP 130/90 | HR 93 | Ht 65.5 in | Wt 182.0 lb

## 2020-04-29 DIAGNOSIS — Z20822 Contact with and (suspected) exposure to covid-19: Secondary | ICD-10-CM | POA: Diagnosis not present

## 2020-04-29 DIAGNOSIS — R0600 Dyspnea, unspecified: Secondary | ICD-10-CM

## 2020-04-29 DIAGNOSIS — Z01812 Encounter for preprocedural laboratory examination: Secondary | ICD-10-CM | POA: Insufficient documentation

## 2020-04-29 DIAGNOSIS — R002 Palpitations: Secondary | ICD-10-CM | POA: Diagnosis not present

## 2020-04-29 DIAGNOSIS — I10 Essential (primary) hypertension: Secondary | ICD-10-CM

## 2020-04-29 DIAGNOSIS — F419 Anxiety disorder, unspecified: Secondary | ICD-10-CM

## 2020-04-29 DIAGNOSIS — R0609 Other forms of dyspnea: Secondary | ICD-10-CM

## 2020-04-29 DIAGNOSIS — E876 Hypokalemia: Secondary | ICD-10-CM | POA: Insufficient documentation

## 2020-04-29 HISTORY — DX: Palpitations: R00.2

## 2020-04-29 HISTORY — DX: Hypokalemia: E87.6

## 2020-04-29 LAB — SARS CORONAVIRUS 2 (TAT 6-24 HRS): SARS Coronavirus 2: NEGATIVE

## 2020-04-29 MED ORDER — AMLODIPINE BESYLATE 5 MG PO TABS: 5.0000 mg | ORAL_TABLET | Freq: Every day | ORAL | 1 refills | Status: DC

## 2020-04-29 NOTE — Progress Notes (Signed)
Patient ID: Jodi [name], female   DOB: 01-14-1951, 69 y.o.   MRN: [id number] Patient enrolled for [employer] to ship a 7 day Cardiac Event Monitor to her home.

## 2020-04-29 NOTE — Patient Instructions (Signed)
Medication Instructions:  ONCE YOU START THE MONITOR FOR THE FIRST 3 DAYS YOU WILL KEEP YOUR MEDICATIONS AS IS  AFTER THE 3 DAYS YOU WILL STOP HYDROCHLOROTHIAZIDE AND POTASSIUM THEN START AMLODIPINE 5 MG DAILY   *If you need a refill on your cardiac medications before your next appointment, please call your pharmacy*  Lab Work: COVID SCREENING 3 DAYS PRIOR TO STRESS TEST   Testing/Procedures: Your physician has requested that you have an exercise tolerance test. For further information please visit [email or web address]. Please also follow instruction sheet, as given.  7 DAY EVENT MONITOR   Follow-Up: At [facility], you and your health needs are our priority.  As part of our continuing mission to provide you with exceptional heart care, we have created designated Provider Care Teams.  These Care Teams include your primary Cardiologist (physician) and Advanced Practice Providers (APPs -  Physician Assistants and Nurse Practitioners) who all work together to provide you with the care you need, when you need it.  We recommend signing up for the patient portal called "MyChart".  Sign up information is provided on this After Visit Summary.  MyChart is used to connect with patients for Virtual Visits (Telemedicine).  Patients are able to view lab/test results, encounter notes, upcoming appointments, etc.  Non-urgent messages can be sent to your provider as well.   To learn more about what you can do with MyChart, go to [email or web address].    Your next appointment:   6-8 week(s)  The format for your next appointment:   In Person  Provider:   You may see DR [facility] or one of the following Advanced Practice Providers on your designated Care Team:    Kerin Ransom, PA-C  [place], [place]  Coletta Memos, [place]  Other Instructions  [employer] Cardiac Event Monitor Instructions Your physician has requested you wear your cardiac event monitor for __7___ days, (1-30). [employer] may  call or text to confirm a shipping address. The monitor will be sent to a land address via UPS. [employer] will not ship a monitor to a PO BOX. It typically takes 3-5 days to receive your monitor after it has been enrolled. [employer] will assist with USPS tracking if your package is delayed. The telephone number for [employer] is [phone]. Once you have received your monitor, please review the enclosed instructions. Instruction tutorials can also be viewed under help and settings on the enclosed cell phone. Your monitor has already been registered assigning a specific monitor serial # to you.  Applying the monitor Remove cell phone from case and turn it on. The cell phone works as Dealer and needs to be within [employer] of you at all times. The cell phone will need to be charged on a daily basis. We recommend you plug the cell phone into the enclosed charger at your bedside table every night.  Monitor batteries: You will receive two monitor batteries labelled #1 and #2. These are your recorders. Plug battery #2 onto the second connection on the enclosed charger. Keep one battery on the charger at all times. This will keep the monitor battery deactivated. It will also keep it fully charged for when you need to switch your monitor batteries. A small light will be blinking on the battery emblem when it is charging. The light on the battery emblem will remain on when the battery is fully charged.  Open package of a Monitor strip. Insert battery #1 into black hood on strip and gently squeeze monitor battery onto connection  as indicated in instruction booklet. Set aside while preparing skin.  Choose location for your strip, vertical or horizontal, as indicated in the instruction booklet. Shave to remove all hair from location. There cannot be any lotions, oils, powders, or colognes on skin where monitor is to be applied. Wipe skin clean with enclosed Saline wipe. Dry skin  completely.  Peel paper labeled #1 off the back of the Monitor strip exposing the adhesive. Place the monitor on the chest in the vertical or horizontal position shown in the instruction booklet. One arrow on the monitor strip must be pointing upward. Carefully remove paper labeled #2, attaching remainder of strip to your skin. Try not to create any folds or wrinkles in the strip as you apply it.  Firmly press and release the circle in the center of the monitor battery. You will hear a small beep. This is turning the monitor battery on. The heart emblem on the monitor battery will light up every 5 seconds if the monitor battery in turned on and connected to the patient securely. Do not push and hold the circle down as this turns the monitor battery off. The cell phone will locate the monitor battery. A screen will appear on the cell phone checking the connection of your monitor strip. This may read poor connection initially but change to good connection within the next minute. Once your monitor accepts the connection you will hear a series of 3 beeps followed by a climbing crescendo of beeps. A screen will appear on the cell phone showing the two monitor strip placement options. Touch the picture that demonstrates where you applied the monitor strip.  Your monitor strip and battery are waterproof. You are able to shower, bathe, or swim with the monitor on. They just ask you do not submerge deeper than 3 feet underwater. We recommend removing the monitor if you are swimming in a lake, river, or ocean.  Your monitor battery will need to be switched to a fully charged monitor battery approximately once a week. The cell phone will alert you of an action which needs to be made.  On the cell phone, tap for details to reveal connection status, monitor battery status, and cell phone battery status. The green dots indicates your monitor is in good status. A red dot indicates there is something  that needs your attention.  To record a symptom, click the circle on the monitor battery. In 30-60 seconds a list of symptoms will appear on the cell phone. Select your symptom and tap save. Your monitor will record a sustained or significant arrhythmia regardless of you clicking the button. Some patients do not feel the heart rhythm irregularities. [employer] will notify us of any serious or critical events.  Refer to instruction booklet for instructions on switching batteries, changing strips, the Do not disturb or Pause features, or any additional questions.  Call [employer] at [phone], to confirm your monitor is transmitting and record your baseline. They will answer any questions you may have regarding the monitor instructions at that time.  Returning the monitor to [place] all equipment back into blue box. Peel off strip of paper to expose adhesive and close box securely. There is a prepaid UPS shipping label on this box. Drop in a UPS drop box, or at a UPS facility like [employer]. You may also contact [employer] to arrange UPS to pick up monitor package at your home.

## 2020-04-29 NOTE — Progress Notes (Signed)
Cardiology Office Note   Date:  04/29/2020   ID:  Jodi [name], DOB May 19, 1951, MRN [id number]  PCP:  Carol Ada, MD  Cardiologist:   Skeet Latch, MD   No chief complaint on file.   History of Present Illness: Jodi [name] is a 69 y.o. female with hypertension and anxiety who is being seen today for the evaluation of palpitations and shortness of breath at the request of Carol Ada, MD.  In early September she was talking to a neighbor and when she stood up she felt like her heart rate stopped and then she felt presyncopal but didn't pass out.  She reports four similar episodes.  She went to the ED each time.  The first time her potassium was noted to be low so she was given a potassium supplement.  Her potassium was 2.9.  She reports being on HCTZ for years.  She notes an occasional fluttering.  She also feels short of breath at times when it occurs.  She didn't feel it while wearing the monitor at the hospital.  She notes that her heart beats hard when she tries to exert herself.  She likes to walk a lot for exercise.  However now, even with carrying laundry up and down steps she is more short of breath than usual.  The symptoms mostly improved after she started potassium.  She continues to have lightheadedness if she bent over.  She saw her PCP and her BP was elevated so she was started on olmesartan.  However she thinks that she has white coat syndrome.  She noted vertigo so she stopped taking it.  She notes that she worries a lot about her health and gets anxious thinking about it.  She hears her heat when trying to sleep.  It seems to be regular.  She has no edema, orthopnea or PND.     Past Medical History:  Diagnosis Date  . Abnormal Pap smear of cervix   . Anxiety   . Aortic atherosclerosis ([place])   . Diverticulitis   . Fatty liver disease, nonalcoholic   . Glaucoma   . H/O dizziness   . Hemorrhoids   . History of depression    incest survivor/dysthymia  .  Hypertension   . Hyperthyroidism   . IBS (irritable bowel syndrome)    lactose intolerance  . Restless leg syndrome   . Sleep apnea   . Snoring 11/17/2012  . Thyroid disease    hypothyroid  . Varicose veins     Past Surgical History:  Procedure Laterality Date  . CHOLECYSTECTOMY  07/19/2017   Dr. Donne Hazel  . COLPOSCOPY  99, 00, 02, 04, 06  . ENDOVENOUS ABLATION SAPHENOUS VEIN W/ LASER Right 04-08-2015   endovenous laser ablation (right greater saphenous vein) by Victorino Dike MD    . LEEP  3/09   CIN II/III     Current Outpatient Medications  Medication Sig Dispense Refill  . ALPRAZolam (XANAX) 0.25 MG tablet Take 0.125 mg by mouth every 6 (six) hours as needed for anxiety.    . bismuth subsalicylate (PEPTO BISMOL) 262 MG chewable tablet Chew 524 mg by mouth as needed for diarrhea or loose stools.    . fluticasone (FLONASE) 50 MCG/ACT nasal spray Place 2 sprays into both nostrils daily as needed for allergies.     . hydrochlorothiazide (HYDRODIURIL) 25 MG tablet Take 25 mg by mouth daily.    . hydrocortisone (ANUSOL-HC) 2.5 % rectal cream Place 1 application  rectally 4 (four) times daily as needed for hemorrhoids or anal itching.    . lactase (LACTAID) 3000 units tablet Take 3,000 Units by mouth as needed (lactaid).     Marland Kitchen levothyroxine (SYNTHROID) 112 MCG tablet Take 112 mcg by mouth daily.    . Multiple Vitamin (MULTIVITAMIN WITH MINERALS) TABS Take 1 tablet by mouth daily.    . potassium chloride (KLOR-CON) 10 MEQ tablet Take 1 tablet (10 mEq total) by mouth daily for 7 days. 7 tablet 0  . saccharomyces boulardii (FLORASTOR) 250 MG capsule Take one tablet BID x 1 month (Patient taking differently: Take 250 mg by mouth daily. ) 60 capsule 0  . timolol (TIMOPTIC) 0.5 % ophthalmic solution Place 1 drop into both eyes every morning.  3  . Vitamins A & D (VITAMIN A & D) ointment Apply 1 application topically as needed (hemmroids).    . EPINEPHrine 0.3 mg/0.3 mL IJ SOAJ injection Inject  0.3 mg into the muscle as needed for anaphylaxis.   0   No current facility-administered medications for this visit.    Allergies:   Sulfites, Iodine, Macrodantin [nitrofurantoin macrocrystal], Shellfish allergy, Almond oil, and Bentyl [dicyclomine hcl]    Social History:  The patient  reports that she quit smoking about 41 years ago. She has never used smokeless tobacco. She reports previous alcohol use. She reports that she does not use drugs.   Family History:  The patient's family history includes Breast cancer in her cousin; Colon polyps in her brother and father; Dementia in her brother, father, and mother; Diabetes in her father; Hyperlipidemia in her father; Hypertension in her father and mother; Parkinson's disease in her mother; Prostate cancer in her father.    ROS:  Please see the history of present illness.   Otherwise, review of systems are positive for none.   All other systems are reviewed and negative.    PHYSICAL EXAM: VS:  BP 130/90   Pulse 93   Ht 5' 5.5" (1.664 m)   Wt 182 lb (82.6 kg)   LMP 07/06/2010   SpO2 97%   BMI 29.83 kg/m  , BMI Body mass index is 29.83 kg/m. GENERAL:  Well appearing HEENT:  Pupils equal round and reactive, fundi not visualized, oral mucosa unremarkable NECK:  No jugular venous distention, waveform within normal limits, carotid upstroke brisk and symmetric, no bruits, no thyromegaly LYMPHATICS:  No cervical adenopathy LUNGS:  Clear to auscultation bilaterally HEART:  RRR.  PMI not displaced or sustained,S1 and S2 within normal limits, no S3, no S4, no clicks, no rubs, no murmurs ABD:  Flat, positive bowel sounds normal in frequency in pitch, no bruits, no rebound, no guarding, no midline pulsatile mass, no hepatomegaly, no splenomegaly EXT:  2 plus pulses throughout, no edema, no cyanosis no clubbing SKIN:  No rashes no nodules NEURO:  Cranial nerves II through XII grossly intact, motor grossly intact throughout PSYCH:  Cognitively  intact, oriented to person place and time   EKG:  EKG is not ordered today. 04/23/2020: Sinus rhythm.  Rate 96 bpm.  Low voltage precordial leads.   Recent Labs: 04/10/2020: Magnesium 2.1; TSH 0.616 04/23/2020: ALT 27; B Natriuretic Peptide 21.5 04/27/2020: BUN 10; Creatinine, Ser 0.80; Hemoglobin 14.9; Platelets 175; Potassium 3.7; Sodium 138    Lipid Panel No results found for: CHOL, TRIG, HDL, CHOLHDL, VLDL, LDLCALC, LDLDIRECT    Wt Readings from Last 3 Encounters:  04/29/20 182 lb (82.6 kg)  04/27/20 184 lb (83.5 kg)  04/23/20 182 lb (82.6 kg)      ASSESSMENT AND PLAN:  # Palpitations:  # Shortness of breath: # Lightheadedness: # Hypertension:  # Exertional dyspnea: Ms. Gonder potassium has been quite low.  I suspect that this is attributable to the hydrochlorothiazide.  I also think it is contributing to her orthostatic symptoms.  The hypokalemia is also probably causing PACs or PVCs.  I suspect that when she has this she becomes quite anxious and they worsen.  All of her symptoms seem to improve significantly after she started potassium supplementation.  Therefore I think it is unlikely that her symptoms are related to ischemia.  We will have her continue her HCTZ for 3 days while wearing a 7-day ambulatory monitor.  At that time she will stop hydrochlorothiazide and potassium supplements.  She will then start on amlodipine 5 mg daily.  Advised her to check her blood pressures and bring to follow-up.  Given that she does have some exertional shortness of breath we will also get an ETT to evaluate for ischemia.  Current medicines are reviewed at length with the patient today.  The patient does not have concerns regarding medicines.  The following changes have been made:  Switch HCTZ to amlodipine.  Stop potassium when HCTZ is stopped  Labs/ tests ordered today include:  No orders of the defined types were placed in this encounter.    Disposition:   FU with Anjelita Sheahan C.  Oval Linsey, MD, [facility] in 6 weeks.   Time spent: 45 minutes-Greater than 50% of this time was spent in counseling, explanation of diagnosis, planning of further management, and coordination of care.   Signed, Avyonna Wagoner C. Oval Linsey, MD, [facility]  04/29/2020 11:00 AM    [place]

## 2020-04-30 ENCOUNTER — Ambulatory Visit: Payer: Medicare Other | Admitting: Physical Therapy

## 2020-04-30 ENCOUNTER — Telehealth (HOSPITAL_COMMUNITY): Payer: Self-pay | Admitting: *Deleted

## 2020-04-30 ENCOUNTER — Telehealth: Payer: Self-pay | Admitting: Cardiovascular Disease

## 2020-04-30 DIAGNOSIS — E876 Hypokalemia: Secondary | ICD-10-CM

## 2020-04-30 NOTE — Telephone Encounter (Signed)
Close encounter 

## 2020-04-30 NOTE — Telephone Encounter (Signed)
Moved patient to tomorrow at 3:30 and ordered BMET since potassium has been running low

## 2020-04-30 NOTE — Telephone Encounter (Signed)
STAT if HR is under 50 or over 120 (normal HR is 60-100 beats per minute)  1) What is your heart rate? 87  2) Do you have a log of your heart rate readings (document readings)? 95-97  3) Do you have any other symptoms? SOB with exertion   Jodi [name] is wanting to know if she should take more potassium for her HR. She is also wanting to know if her ETT can be made sooner since she has received her negative result.

## 2020-05-01 ENCOUNTER — Ambulatory Visit (HOSPITAL_COMMUNITY)
Admission: RE | Admit: 2020-05-01 | Discharge: 2020-05-01 | Disposition: A | Discharge status: Home / Self Care | Payer: Medicare Other | Source: Ambulatory Visit | Attending: Cardiovascular Disease | Admitting: Cardiovascular Disease

## 2020-05-01 ENCOUNTER — Other Ambulatory Visit: Payer: Self-pay

## 2020-05-01 DIAGNOSIS — R0609 Other forms of dyspnea: Secondary | ICD-10-CM

## 2020-05-01 DIAGNOSIS — R0600 Dyspnea, unspecified: Secondary | ICD-10-CM | POA: Diagnosis present

## 2020-05-01 LAB — EXERCISE TOLERANCE TEST
Estimated workload: 7 METS
Exercise duration (min): 6 min
Exercise duration (sec): 0 s
MPHR: 151 {beats}/min
Peak HR: 169 {beats}/min
Percent HR: 111 %
Rest HR: 101 {beats}/min

## 2020-05-02 ENCOUNTER — Ambulatory Visit (HOSPITAL_COMMUNITY)
Admission: RE | Admit: 2020-05-02 | Payer: Medicare Other | Source: Ambulatory Visit | Attending: Cardiovascular Disease | Admitting: Cardiovascular Disease

## 2020-05-02 LAB — BASIC METABOLIC PANEL
BUN/Creatinine Ratio: 14 (ref 12–28)
BUN: 10 mg/dL (ref 8–27)
CO2: 22 mmol/L (ref 20–29)
Calcium: 9.6 mg/dL (ref 8.7–10.3)
Chloride: 100 mmol/L (ref 96–106)
Creatinine, Ser: 0.73 mg/dL (ref 0.57–1.00)
GFR calc Af Amer: 97 mL/min/{1.73_m2} (ref >59)
GFR calc non Af Amer: 84 mL/min/{1.73_m2} (ref >59)
Glucose: 85 mg/dL (ref 65–99)
Potassium: 3.6 mmol/L (ref 3.5–5.2)
Sodium: 138 mmol/L (ref 134–144)

## 2020-05-03 ENCOUNTER — Telehealth: Payer: Self-pay | Admitting: Cardiovascular Disease

## 2020-05-03 NOTE — Telephone Encounter (Signed)
Spoke with the patient at great length about her many concerns. 1) no one has called her with GXT results yet. Reviewed read with the patient but she understands she will be called once Dr. Oval Linsey reviews the report herself. 2) she is extremely concerned about her potassium. She is currently taking potassium 20 meq daily (this is not in her medication list but will be added) and she'd like Dr. Oval Linsey to refill, even though HCTZ and K supplement are to be stopped 3 days after wearing the monitor. She also wants to monitor her K more closely as she has no repeat labs scheduled.  3) she is concerned about stopping HCTZ suddenly. Reiterated to her several times that HCTZ is not a drug that needs to be tapered and it is likely causing her low K. Reiterated to her that amlodipine (HCTZ replacement) will not have the same effect on potassium levels. Discussed amlodipine's method of action at great length.  4) she complains of palpitations, especially in the morning. Reiterated to her that the monitor she should receive shortly will diagnose what those palpitations are so they can be treated appropriately. She now understands the monitor will be a 7 day EKG reading. 5) she states she was told to stay hydrated but doesn't know what that means. Instructed her to keep water with her and not to allow herself to get thirsty.  She requests Dr. Blenda Mounts nurse to call early next week re: potassium refill and repeat lab work. Confirmed with her she has enough K to last her a few more days. She was grateful for call and time spent.

## 2020-05-03 NOTE — Telephone Encounter (Signed)
Patient is requesting to come to the office for an EKG. Please call.

## 2020-05-03 NOTE — Telephone Encounter (Signed)
Returned pt's call. Pt describes having chest discomfort above her her left breast, but states that she feels "like it's in my muscles." Pt states discomfort feels superficial and wonders if it is because she gripped the treadmill so hard during her stress test a couple days ago. Denies any shortness of breath, diaphoresis, neck, arm or jaw pain. Denies crushing sensation, tightness or pressure. Pt has briefly palpated her radial pulse and thinks it feels "normal" to her (not elevated or irregular). Pt denies feeling any palpitations. Pt has not taken her blood pressure today because she feels that makes her more anxious. Pt states she is very anxious and feels this is making things worse. States she meditated earlier in the afternoon and that relieved her chest discomfort. Counseled pt that if she experiences worsening chest discomfort/pain, shortness of breath, sweating, nausea, arm, neck, shoulder or jaw pain to seek emergent medical attention. Advised pt that if she feels she needs an EKG that she should seek emergent medical attention. Advised patient to keep a diary of symptoms with situational context notes to share at her next appointment as well as to compare to the readings once she wears the [employer] monitor. Pt states she feels much better after our discussion and is agreeable with plan.

## 2020-05-03 NOTE — Telephone Encounter (Signed)
Shanti is calling requesting to speak with Rip Harbour in regards to some questions she has. Please advise.

## 2020-05-07 ENCOUNTER — Other Ambulatory Visit: Payer: Self-pay | Admitting: Cardiovascular Disease

## 2020-05-07 ENCOUNTER — Ambulatory Visit (INDEPENDENT_AMBULATORY_CARE_PROVIDER_SITE_OTHER): Payer: Medicare Other

## 2020-05-07 DIAGNOSIS — R002 Palpitations: Secondary | ICD-10-CM | POA: Diagnosis not present

## 2020-05-07 MED ORDER — POTASSIUM CHLORIDE CRYS ER 10 MEQ PO TBCR
20.0000 meq | EXTENDED_RELEASE_TABLET | Freq: Every day | ORAL | 0 refills | Status: DC
Start: 2020-05-07 — End: 2020-06-25

## 2020-05-07 NOTE — Telephone Encounter (Signed)
*  STAT* If patient is at the pharmacy, call can be transferred to refill team.   1. Which medications need to be refilled? (please list name of each medication and dose if known) new prescription for Potassium  2. Which pharmacy/location (including street and city if local pharmacy) is medication to be sent to? [facility] [place], [place]  3. Do they need a 30 day or 90 day supply? Not sure how many she needs

## 2020-05-09 ENCOUNTER — Encounter: Payer: Self-pay | Admitting: Obstetrics & Gynecology

## 2020-05-09 ENCOUNTER — Encounter: Payer: Self-pay | Admitting: Physical Therapy

## 2020-05-09 ENCOUNTER — Ambulatory Visit (INDEPENDENT_AMBULATORY_CARE_PROVIDER_SITE_OTHER): Payer: Medicare Other | Admitting: Obstetrics & Gynecology

## 2020-05-09 ENCOUNTER — Other Ambulatory Visit: Payer: Self-pay

## 2020-05-09 ENCOUNTER — Ambulatory Visit: Payer: Medicare Other | Attending: Family Medicine | Admitting: Physical Therapy

## 2020-05-09 ENCOUNTER — Other Ambulatory Visit (HOSPITAL_COMMUNITY)
Admission: RE | Admit: 2020-05-09 | Discharge: 2020-05-09 | Disposition: A | Discharge status: Home / Self Care | Payer: Medicare Other | Source: Ambulatory Visit | Attending: Obstetrics & Gynecology | Admitting: Obstetrics & Gynecology

## 2020-05-09 VITALS — BP 130/72 | HR 72 | Resp 16 | Ht 65.0 in | Wt 184.0 lb

## 2020-05-09 DIAGNOSIS — Z9289 Personal history of other medical treatment: Secondary | ICD-10-CM | POA: Diagnosis not present

## 2020-05-09 DIAGNOSIS — Z01411 Encounter for gynecological examination (general) (routine) with abnormal findings: Secondary | ICD-10-CM | POA: Insufficient documentation

## 2020-05-09 DIAGNOSIS — Z124 Encounter for screening for malignant neoplasm of cervix: Secondary | ICD-10-CM | POA: Diagnosis not present

## 2020-05-09 DIAGNOSIS — Z1151 Encounter for screening for human papillomavirus (HPV): Secondary | ICD-10-CM | POA: Diagnosis not present

## 2020-05-09 DIAGNOSIS — R42 Dizziness and giddiness: Secondary | ICD-10-CM | POA: Insufficient documentation

## 2020-05-09 DIAGNOSIS — R2681 Unsteadiness on feet: Secondary | ICD-10-CM | POA: Diagnosis present

## 2020-05-09 NOTE — Addendum Note (Signed)
Addended by: Megan Salon on: 05/09/2020 05:00 PM   Modules accepted: Level of Service

## 2020-05-09 NOTE — Progress Notes (Signed)
69 y.o. G1P1 Married White or Caucasian female here for breast and pelvic exam.  I am also following her for history of abnormal pap smears.  She had a sebaceous cyst noted in her breast.  She's not had any new issues and actually can't find it when she does a breast exam.  Denies vaginal bleeding.  She was seen Dr. Oval Linsey for palpitations.  She has been diagnosed with low potassium and that is what Dr. Oval Linsey thinks is the cause.  She's done a stress test.     Patient's last menstrual period was 07/06/2010.          Sexually active: No.  H/O STD:  H/o HR HPV  Health Maintenance: PCP:  Carol Ada, MD.  Last wellness appt was April 2021.  Did blood work at that appt:   Vaccines are up to date:  yes Colonoscopy:  04/08/16 Cologuard Negative; 04/24/19 Negative MMG:  09/25/19 BIRADS 1 negative/density b BMD:  09/19/18 Osteopenia Last pap smear:  05/08/19 Negative        05/06/18 Negative          98/28/18 Neg:Neg HR HPV H/o abnormal pap smear:  Yes   reports that she quit smoking about 41 years ago. She has never used smokeless tobacco. She reports previous alcohol use. She reports that she does not use drugs.  Past Medical History:  Diagnosis Date  . Abnormal Pap smear of cervix   . Anxiety   . Aortic atherosclerosis ([place])   . Diverticulitis   . Fatty liver disease, nonalcoholic   . Glaucoma   . H/O dizziness   . Hemorrhoids   . History of depression    incest survivor/dysthymia  . Hypertension   . Hyperthyroidism   . Hypokalemia 04/29/2020  . IBS (irritable bowel syndrome)    lactose intolerance  . Palpitations 04/29/2020  . Restless leg syndrome   . Sleep apnea   . Snoring 11/17/2012  . Thyroid disease    hypothyroid  . Varicose veins     Past Surgical History:  Procedure Laterality Date  . CHOLECYSTECTOMY  07/19/2017   Dr. Donne Hazel  . COLPOSCOPY  99, 00, 02, 04, 06  . ENDOVENOUS ABLATION SAPHENOUS VEIN W/ LASER Right 04-08-2015   endovenous laser ablation  (right greater saphenous vein) by Victorino Dike MD    . 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 ALPRAZolam (XANAX) 0.25 MG tablet Take 0.125 mg by mouth every 6 (six) hours as needed for anxiety.    Marland Kitchen amLODipine (NORVASC) 5 MG tablet Take 1 tablet (5 mg total) by mouth daily. 90 tablet 1  . bismuth subsalicylate (PEPTO BISMOL) 262 MG chewable tablet Chew 524 mg by mouth as needed for diarrhea or loose stools.    Marland Kitchen EPINEPHrine 0.3 mg/0.3 mL IJ SOAJ injection Inject 0.3 mg into the muscle as needed for anaphylaxis.   0  . fluticasone (FLONASE) 50 MCG/ACT nasal spray Place 2 sprays into both nostrils daily as needed for allergies.     . hydrocortisone (ANUSOL-HC) 2.5 % rectal cream Place 1 application rectally 4 (four) times daily as needed for hemorrhoids or anal itching.    . lactase (LACTAID) 3000 units tablet Take 3,000 Units by mouth as needed (lactaid).     Marland Kitchen levothyroxine (SYNTHROID) 112 MCG tablet Take 112 mcg by mouth daily.    . Multiple Vitamin (MULTIVITAMIN WITH MINERALS) TABS Take 1 tablet by mouth  daily.    . potassium chloride (KLOR-CON) 10 MEQ tablet Take 2 tablets (20 mEq total) by mouth daily. 6 tablet 0  . saccharomyces boulardii (FLORASTOR) 250 MG capsule Take one tablet BID x 1 month (Patient taking differently: Take 250 mg by mouth daily. ) 60 capsule 0  . timolol (TIMOPTIC) 0.5 % ophthalmic solution Place 1 drop into both eyes every morning.  3  . Vitamins A & D (VITAMIN A & D) ointment Apply 1 application topically as needed (hemmroids).     No current facility-administered medications for this visit.    Family History  Problem Relation Age of Onset  . Hypertension Father   . Diabetes Father   . Dementia Father   . Hyperlipidemia Father   . Prostate cancer Father   . Colon polyps Father   . Parkinson's disease Mother   . Dementia Mother   . Hypertension Mother   . Breast cancer Cousin        maternal side of  family  . Dementia Brother   . Colon polyps Brother   . Diabetes Brother   . Seizures Brother   . Heart disease Maternal Uncle   . Diabetes Paternal Grandmother   . Colon cancer Neg Hx   . Esophageal cancer Neg Hx   . Stomach cancer Neg Hx   . Liver disease Neg Hx   . Pancreatic cancer Neg Hx     Review of Systems  All other systems reviewed and are negative.   Exam:   BP 130/72 (BP Location: Right Arm, Patient Position: Sitting, Cuff Size: Normal)   Pulse 72   Resp 16   Ht 5\' 5"  (1.651 m)   Wt 184 lb (83.5 kg)   LMP 07/06/2010   BMI 30.62 kg/m   Height: 5\' 5"  (165.1 cm)  General appearance: alert, cooperative and appears stated age Breasts: normal appearance, no masses or tenderness Abdomen: soft, non-tender; bowel sounds normal; no masses,  no organomegaly Lymph nodes: Cervical, supraclavicular, and axillary nodes normal.  No abnormal inguinal nodes palpated Neurologic: Grossly normal  Pelvic: External genitalia:  no lesions              Urethra:  normal appearing urethra with no masses, tenderness or lesions              Bartholins and Skenes: normal                 Vagina: normal appearing vagina with normal color and discharge, no lesions              Cervix: no lesions              Pap taken: Yes.   Bimanual Exam:  Uterus:  normal size, contour, position, consistency, mobility, non-tender              Adnexa: normal adnexa and no mass, fullness, tenderness               Rectovaginal: Confirms               Anus:  normal sphincter tone, no lesions  Chaperone, Olene Floss, CMA, was present for exam.  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 pap smear and HR  HPV obtained today.  Due to hx of abnormal  pap smear, pt would like phone call as well as result being placed on mychart. Lab work is done with Dr. Tamala Julian Cologuard neg 2020.  Follow up 3 years. BMD 2020. Return annually or prn  25 minutes of total time was spent for this patient encounter, including preparation, face-to-face counseling with the patient and coordination of care, and documentation of the encounter.

## 2020-05-09 NOTE — Therapy (Signed)
[facility] [address] [place], [place], [address] Phone: [phone]   Fax:  [phone]  Physical Therapy Treatment  Patient Details  Name: Jodi [name] MRN: [id number] Date of Birth: 03/04/1951 Referring Provider (PT): Carol Ada, MD   Encounter Date: 05/09/2020   PT End of Session - 05/09/20 1312    Visit Number 2    Number of Visits 4    Date for PT Re-Evaluation 05/10/20    Authorization Type [employer] Medicare    Authorization Time Period 04-18-20 - 06-18-20    PT Start Time 0852    PT Stop Time 0935    PT Time Calculation (min) 43 min    Equipment Utilized During Treatment Other (comment)   harness vest used on balance master with SOT   Activity Tolerance Patient tolerated treatment well    Behavior During Therapy [facility] for tasks assessed/performed           Past Medical History:  Diagnosis Date   Abnormal Pap smear of cervix    Anxiety    Aortic atherosclerosis ([facility] [place])    Diverticulitis    Fatty liver disease, nonalcoholic    Glaucoma    H/O dizziness    Hemorrhoids    History of depression    incest survivor/dysthymia   Hypertension    Hyperthyroidism    Hypokalemia 04/29/2020   IBS (irritable bowel syndrome)    lactose intolerance   Palpitations 04/29/2020   Restless leg syndrome    Sleep apnea    Snoring 11/17/2012   Thyroid disease    hypothyroid   Varicose veins     Past Surgical History:  Procedure Laterality Date   CHOLECYSTECTOMY  07/19/2017   Dr. Donne Hazel   COLPOSCOPY  99, 00, 02, 04, 06   ENDOVENOUS ABLATION SAPHENOUS VEIN W/ LASER Right 04-08-2015   endovenous laser ablation (right greater saphenous vein) by Victorino Dike MD     LEEP  3/09   CIN II/III    There were no vitals filed for this visit.   Subjective Assessment - 05/09/20 1243    Subjective Pt states she has been to ED twice since she was here for initial eval; states her potassium was low -  thinks that was contributing to her dizziness.  Says she is doing better now but is wearing a heart monitor.    Pertinent History hypokalemia, h/o depression, anxiety, sleep apnea    Patient Stated Goals improve balance ("get it better") - has some breathlessness - but says it has improved some every day    Currently in Pain? No/denies                   Vestibular Assessment - 05/09/20 0001      Balancemaster   Engineer, materials Comment composite score 61/100 (N=68/100)          Sensory Organization Test (SOT);  Composite score 61/100;  N= 68/100  Somatosensory WNL's Visual input decreased at 55/100 with N= 80/100 Vestibular input 24/100 with N= 55/100  Condition 1 = all 3 trials WNL's Condition 2 = all 3 trials WNL's Condition 3 = all 3 trials WNL's Condition 4 = all 3 trials decreased at approx. 50/100 with N= 76/100 Condition 5 = FALL on trial 1, trials 2 and 3 decreased at approx. 32/100 with N= 51/100 Condition 6 = trial 1 slightly decr. From N; trials 2 & 3 WNL's  COG in midline but  shifted anteriorly     Following exercises re-issued for HEP (from previous admission in March 2019)   Vestibular Treatment/Exercise - 05/09/20 0001      Vestibular Treatment/Exercise   Gaze Exercises X1 Viewing Horizontal;X1 Viewing Vertical      X1 Viewing Horizontal   Foot Position bil. stance   plain background   Time --   60 secs   Reps 1    Comments no increased c/o dizziness upon completion of exercise      X1 Viewing Vertical   Foot Position bil. stance   plain background   Time --   60 secs   Reps 1    Comments no increased c/o dizziness upon completion of exercise          Instructed pt to use plain background for approx. 3 days, then progress to patterned background  Balance on Foam - 4 positions - feet apart/EO;  Feet apart/EC;  Feet together/EO; feet together/EC - with head turns  horizontal and vertical    Balance Exercises - 05/09/20 0922      Balance Exercises: Standing   Standing Eyes Opened Narrow base of support (BOS);Wide (BOA);Head turns;Foam/compliant surface;5 reps   5 reps horizontal and vertical   Standing Eyes Closed Narrow base of support (BOS);Wide (BOA);Head turns;Foam/compliant surface;5 reps   horizontal & vertical head turns            PT Education - 05/09/20 1311    Education Details Instructed in x1 viewing (plain background for 3 days, then progress to patterned background); balance on foam - feet apart/together, EO and EC - with head turns    Person(s) Educated Patient    Methods Explanation;Demonstration;Handout    Comprehension Verbalized understanding;Returned demonstration               PT Long Term Goals - 05/09/20 1313      PT LONG TERM GOAL #1   Title Independent in HEP for balance and vestibular exercises.    Time 4    Period Weeks    Status New      PT LONG TERM GOAL #2   Title Pt will improve DHI score by at least 10 points to indicate improvement in status/vertigo.    Time 4    Period Weeks    Status New                 Plan - 05/09/20 1454    Clinical Impression Statement Pt's composite score on SOT is slightly decreased at 61/100, with N= 68/100.  Pt's somatosensory input is WNL's per SOT, visual input decreased at 55/100 with N= 80/100, and vestibular input moderately decreased at 24/100 with N= 54/100.  Pt reported no increase in symptoms upon completion of test.  Test results slightly indicate decreased vestibular input in maintaining balance. Reviewed HEP issued in previous OP admission in March 2019 and progressed HEP as appropriate.  Cont with POC.    Personal Factors and Comorbidities Past/Current Experience;Comorbidity 1    Comorbidities h/o vestibular hypofunction, BPPV, hypokalemia    Examination-Activity Limitations Locomotion Level    Examination-Participation Restrictions Cleaning;Community Activity;Meal Prep;Shop     Stability/Clinical Decision Making Stable/Uncomplicated    Rehab Potential Good    PT Frequency 1x / week    PT Duration 4 weeks    PT Treatment/Interventions ADLs/Self Care Home Management;Vestibular;Neuromuscular re-education;Balance training;Patient/family education;Gait training;Therapeutic activities;Therapeutic exercise    PT Next Visit Plan check HEP - x1 viewing on patterned background and balance on  foam    PT Home Exercise Plan see above    Consulted and Agree with Plan of Care Patient           Patient will benefit from skilled therapeutic intervention in order to improve the following deficits and impairments:  Dizziness, Difficulty walking, Decreased balance  Visit Diagnosis: Dizziness and giddiness  Unsteadiness on feet     Problem List Patient Active Problem List   Diagnosis Date Noted   Palpitations 04/29/2020   Hypokalemia 04/29/2020   Spider veins 11/23/2019   Edema of both legs 11/23/2019   CIN III (cervical intraepithelial neoplasia grade III) with severe dysplasia 03/05/2017   Anxiety disorder 01/04/2015   Essential (primary) hypertension 01/04/2015   Hypothyroidism 01/04/2015   Pure hypercholesterolemia 01/04/2015   Right upper quadrant pain 01/01/2015   Vertigo, labyrinthine 11/17/2012   Snoring 11/17/2012    Alda Lea, PT 05/09/2020, 3:03 PM  [facility] [address] [place], [place], [address] Phone: [phone]   Fax:  [phone]  Name: Jodi [name] MRN: [id number] Date of Birth: 03-22-1951

## 2020-05-09 NOTE — Patient Instructions (Signed)
  Feet Together (Compliant Surface) Varied Arm Positions - Eyes Open - then with EYES CLOSED - (NO ARM MOVEMENTS); with HEAD MOVEMENTS HORIZONTAL AND VERTICAL    With eyes open, standing on compliant surface: __PILLOWS______, feet together and arms out, look at a stationary object. Hold _30___ seconds. Repeat _1___ times per session. Do __1__ sessions per day.      Feet Apart (Compliant Surface) Head Motion - Eyes Open - then with eyes closed     With eyes open, standing on compliant surface: __PILLOWS______, feet shoulder width apart, move head slowly: up and down. Repeat __1__ times per session. Do __1__ sessions per day.       Gaze Stabilization: Tip Card  1.Target must remain in focus, not blurry, and appear stationary while head is in motion. 2.Perform exercises with small head movements (45 to either side of midline). 3.Increase speed of head motion so long as target is in focus. 4.If you wear eyeglasses, be sure you can see target through lens (therapist will give specific instructions for bifocal / progressive lenses). 5.These exercises may provoke dizziness or nausea. Work through these symptoms. If too dizzy, slow head movement slightly. Rest between each exercise. 6.Exercises demand concentration; avoid distractions. 7.For safety, perform standing exercises close to a counter, wall, corner, or next to someone.   Gaze Stabilization: Standing Feet Apart    Feet shoulder width apart, keeping eyes on target on wall __6__ feet away, tilt head down 15-30 and move head side to side for _60___ seconds. Repeat while moving head up and down for _60___ seconds. Do __3__ sessions per day. Repeat using target on pattern background.  Copyright  V[employer]. All rights reserved.

## 2020-05-10 LAB — CYTOLOGY - PAP
Comment: NEGATIVE
Diagnosis: NEGATIVE
High risk HPV: NEGATIVE

## 2020-05-15 ENCOUNTER — Telehealth: Payer: Self-pay | Admitting: Cardiovascular Disease

## 2020-05-15 ENCOUNTER — Other Ambulatory Visit: Payer: Self-pay

## 2020-05-15 DIAGNOSIS — I10 Essential (primary) hypertension: Secondary | ICD-10-CM

## 2020-05-15 DIAGNOSIS — E876 Hypokalemia: Secondary | ICD-10-CM

## 2020-05-15 LAB — BASIC METABOLIC PANEL
BUN/Creatinine Ratio: 22 (ref 12–28)
BUN: 8 mg/dL (ref 8–27)
CO2: 26 mmol/L (ref 20–29)
Calcium: 9 mg/dL (ref 8.7–10.3)
Chloride: 104 mmol/L (ref 96–106)
Creatinine, Ser: 0.37 mg/dL — ABNORMAL LOW (ref 0.57–1.00)
GFR calc Af Amer: 126 mL/min/{1.73_m2} (ref >59)
GFR calc non Af Amer: 109 mL/min/{1.73_m2} (ref >59)
Glucose: 112 mg/dL — ABNORMAL HIGH (ref 65–99)
Potassium: 4 mmol/L (ref 3.5–5.2)
Sodium: 141 mmol/L (ref 134–144)

## 2020-05-15 NOTE — Telephone Encounter (Signed)
STAT if HR is under 50 or over 120 (normal HR is 60-100 beats per minute)  1) What is your heart rate? 94 about an hour ago  2) Do you have a log of your heart rate readings (document readings)? 118 when woke up today, went up to 136 today when walking  3) Do you have any other symptoms? No   Patient states her HR has been racing every morning. She states her HR went up to 136, but was 94 about an hour ago.

## 2020-05-15 NOTE — Telephone Encounter (Signed)
Called and spoke with patient. She states her lowest heart rate while resting is 94. HR increases to the 120s while walking. Pulse this morning when first waking was in the 110s. She states that if she flexes her feet it causes her heart to race. She started taking 5 mg amlodipine about 5 ago.   BP now is 175/102, HR 93. She denies dizziness, blurry vision, and headache. At the end of the phone call, she stated it was time to take her amlodipine for the day.  She states she has not slept in several days. When she gets up and then goes back to bed, her heart starts racing and/or beating harder and she can't go back to sleep. She admits to excessive anxiety surrounding her heart racing. In addition, she reports untreated sleep apnea.   Due to anxiety, patient does not take BP regularly at home. She only took it when we asked her to take it when answering her phone call. At her GYN office a few days ago pressure was 136/72 - this was after she started taking amlodipine.   I have asked her to keep a BP log for the next 3-4 days to establish a baseline. I will send to Dr. Oval Linsey to advise on any medication changes. We reviewed ER precautions. I suspect her BP has normalized with 5 mg amlodipine, but BP log will be helpful information.

## 2020-05-15 NOTE — Telephone Encounter (Signed)
Spoke with patient. Patient reports she woke up this morning and felt her heart racing. She checked her heart rate and it was 120, it took about an hour to get to the low 100s.   Then she went out to the store and after walking up a hill and getting in her car she checked her HR with her pulse ox and it was 136 when first placed on and then 115. She feels as though she needs to take a deep breath with exertion. She feels as though her heart rate races with exertion.   Heart rate 106 currently, BP 187/103 . No headache, blurred vision, confusion, dizziness. Patient does report "white coat syndrome when getting BP checked.  Patient is not getting much sleep due to heart rate issues. Patient started amlodipine 5 days ago. She is not checking her blood pressure daily, advised to keep blood pressure log daily.

## 2020-05-17 NOTE — Telephone Encounter (Signed)
Spoke with patient and she stated her chest pain was better when she was rubbing it and has improved with Tylenol. She was lifting bags of dirt earlier in the week Patient stated she does get very anxious taking her blood pressure at home but today felt less anxious and thinks her numbers are reflecting that.  She will continue to monitor and call back with update

## 2020-05-17 NOTE — Telephone Encounter (Signed)
Pt called in and stated she was to keep track of her BP the last 2 days   11/11 161/82  121- 10:30am 153/96  105 11:15am  155/87 102 11:20am  141/90 90 11:37am   11/12 154/89 104 9:08am  147/78 99 9:10   Pt said stated she has a lot of anxiety about blood pressure cuffs   Pt c/o of Chest Pain: STAT if CP now or developed within 24 hours  1. Are you having CP right now?  Yes only achy   2. Are you experiencing any other symptoms (ex. SOB, nausea, vomiting, sweating)? No   3. How long have you been experiencing CP? This stated this morning after she took her BP   4. Is your CP continuous or coming and going? Coming and going.  She feels as this probably has to do with her Anxiety .     5. Have you taken Nitroglycerin? No      ?

## 2020-05-20 ENCOUNTER — Telehealth: Payer: Self-pay | Admitting: Cardiovascular Disease

## 2020-05-20 NOTE — Telephone Encounter (Signed)
Skeet Latch, MD  05/20/2020 12:27 PM EST     Occasional early beats from the top chamber of the heart. These are not dangerous but can cause a fluttering sensation. If she is bothered by them, start metoprolol tartrate 25mg  bid. Given that they are not dangerous she doesn't need to take the medication unless she wants to try it   Reviewed results with patient, no Metoprolol at this time

## 2020-05-20 NOTE — Telephone Encounter (Signed)
Spoke with patient and this morning blood pressure down to 135/80 She would like to monitor blood pressure for couple of days and call back with readings, no weekend readings

## 2020-05-20 NOTE — Telephone Encounter (Signed)
Instead of the metoprolol I recommended in her monitor report, lets try carvedilol 12.5mg  bid.  This should help her BP and heart rate.

## 2020-05-20 NOTE — Telephone Encounter (Signed)
Jodi [name] is calling requesting her heart monitor results due to seeing them published in my chart.

## 2020-05-21 ENCOUNTER — Other Ambulatory Visit: Payer: Self-pay

## 2020-05-21 ENCOUNTER — Ambulatory Visit: Payer: Medicare Other | Admitting: Physical Therapy

## 2020-05-21 DIAGNOSIS — R42 Dizziness and giddiness: Secondary | ICD-10-CM | POA: Diagnosis not present

## 2020-05-21 DIAGNOSIS — R2681 Unsteadiness on feet: Secondary | ICD-10-CM

## 2020-05-22 ENCOUNTER — Encounter: Payer: Self-pay | Admitting: Physical Therapy

## 2020-05-22 NOTE — Therapy (Signed)
[facility] [address] [place], [place], [address] Phone: [phone]   Fax:  [phone]  Physical Therapy Treatment  Patient Details  Name: ADRINNE SZE MRN: [id number] Date of Birth: 04/20/1951 Referring Provider (PT): Carol Ada, MD   Encounter Date: 05/21/2020   PT End of Session - 05/22/20 1901    Visit Number 3    Number of Visits 4    Date for PT Re-Evaluation 05/10/20    Authorization Type [employer] Medicare    Authorization Time Period 04-18-20 - 06-18-20    PT Start Time 1105    PT Stop Time 1147    PT Time Calculation (min) 42 min    Equipment Utilized During Treatment --    Activity Tolerance Patient tolerated treatment well    Behavior During Therapy [facility] for tasks assessed/performed           Past Medical History:  Diagnosis Date  . Abnormal Pap smear of cervix   . Anxiety   . Aortic atherosclerosis ([place])   . Diverticulitis   . Fatty liver disease, nonalcoholic   . Glaucoma   . H/O dizziness   . Hemorrhoids   . History of depression    incest survivor/dysthymia  . Hypertension   . Hyperthyroidism   . Hypokalemia 04/29/2020  . IBS (irritable bowel syndrome)    lactose intolerance  . Palpitations 04/29/2020  . Restless leg syndrome   . Sleep apnea   . Snoring 11/17/2012  . Thyroid disease    hypothyroid  . Varicose veins     Past Surgical History:  Procedure Laterality Date  . CHOLECYSTECTOMY  07/19/2017   Dr. Donne Hazel  . COLPOSCOPY  99, 00, 02, 04, 06  . ENDOVENOUS ABLATION SAPHENOUS VEIN W/ LASER Right 04-08-2015   endovenous laser ablation (right greater saphenous vein) by Victorino Dike MD    . LEEP  3/09   CIN II/III    There were no vitals filed for this visit.   Subjective Assessment - 05/22/20 1858    Subjective Pt states she has not been working on exercises lately but plans to really do so this upcoming week    Pertinent History hypokalemia, h/o depression, anxiety,  sleep apnea    Patient Stated Goals improve balance ("get it better") - has some breathlessness - but says it has improved some every day    Currently in Pain? No/denies                                  Balance Exercises - 05/22/20 0001      Balance Exercises: Standing   Standing Eyes Opened Narrow base of support (BOS);Wide (BOA);Head turns;Foam/compliant surface;5 reps   horizontal & vertical   Standing Eyes Closed Narrow base of support (BOS);Wide (BOA);Head turns;Foam/compliant surface;5 reps   horizontal and vertical   SLS Eyes open;Solid surface;2 reps   10 secs with UE support prn   Rockerboard Anterior/posterior;Head turns;EO;EC;10 reps   with UE support prn   Balance Beam standing perpendicular on blue balance beam with EO and EC    Turning Right;Left;5 reps   sit to stand pivot turn to each side 5 reps   Marching Foam/compliant surface;Head turns;Static;5 reps   horizontal and vertical                 PT Long Term Goals - 05/22/20 1904      PT LONG  TERM GOAL #1   Title Independent in HEP for balance and vestibular exercises.    Time 4    Period Weeks    Status New      PT LONG TERM GOAL #2   Title Pt will improve DHI score by at least 10 points to indicate improvement in status/vertigo.    Baseline DHI score 40% (moderate handicap group) - 04-18-20    Time 4    Period Weeks    Status New                 Plan - 05/22/20 1902    Clinical Impression Statement Pt has mild unsteadiness with standing on compliant surfaces with EC with head turns, indicative of vestibular hypofunction.  Pt improved with practice & repetition.  Cont with POC.    Personal Factors and Comorbidities Past/Current Experience;Comorbidity 1    Comorbidities h/o vestibular hypofunction, BPPV, hypokalemia    Examination-Activity Limitations Locomotion Level    Examination-Participation Restrictions Cleaning;Community Activity;Meal Prep;Shop     Stability/Clinical Decision Making Stable/Uncomplicated    Rehab Potential Good    PT Frequency 1x / week    PT Duration 4 weeks    PT Treatment/Interventions ADLs/Self Care Home Management;Vestibular;Neuromuscular re-education;Balance training;Patient/family education;Gait training;Therapeutic activities;Therapeutic exercise    PT Next Visit Plan check HEP - x1 viewing on patterned background and balance on foam    PT Home Exercise Plan see above    Consulted and Agree with Plan of Care Patient           Patient will benefit from skilled therapeutic intervention in order to improve the following deficits and impairments:  Dizziness, Difficulty walking, Decreased balance  Visit Diagnosis: Unsteadiness on feet  Dizziness and giddiness     Problem List Patient Active Problem List   Diagnosis Date Noted  . Palpitations 04/29/2020  . Hypokalemia 04/29/2020  . Spider veins 11/23/2019  . Edema of both legs 11/23/2019  . 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 Anxiety disorder 01/04/2015  . Essential (primary) hypertension 01/04/2015  . Hypothyroidism 01/04/2015  . Pure hypercholesterolemia 01/04/2015  . Right upper quadrant pain 01/01/2015  . Vertigo, labyrinthine 11/17/2012  . Snoring 11/17/2012    DildayJenness Corner, PT 05/22/2020, 7:05 PM  [facility] [address] [place], [place], [address] Phone: [phone]   Fax:  [phone]  Name: CRYSTELLE FERRUFINO MRN: [id number] Date of Birth: 1950/11/19

## 2020-05-24 ENCOUNTER — Telehealth: Payer: Self-pay | Admitting: Cardiovascular Disease

## 2020-05-24 MED ORDER — CARVEDILOL 12.5 MG PO TABS: 12.5000 mg | ORAL_TABLET | Freq: Two times a day (BID) | ORAL | 3 refills | Status: DC

## 2020-05-24 NOTE — Telephone Encounter (Signed)
Returned call to pt. She states that when she woke this morning about 630am and her HR was increased to 115. At 7am BP 145/87 HR 102. 715am 161/86 HR 96. 745am 157/90. Pt states BP finally went down 8am 138/82 HR 88. She states that she took 1/4 tab of her Xanax (0.125mg  tab) at 7 am'ish. Pt states that she is having anxiety about this issue this wakes her and she is unable to go back to sleep. She feels as though she is "loosing sleep over this" she states that she will sleep in her chair sometimes and this does not happen, the increased HR and BP. (per Dr Oval Linsey: Instead of the metoprolol I recommended in her monitor report, lets try carvedilol 12.5mg  bid.  This should help her BP and heart rate.) new rx sent to requested pharmacy. She states that she will pick up today and start taking. She will continue to take and log her BP/HR at least BID and not take her BP too often.   She just wanted to make sure that Dr Oval Linsey knew that she has OSA and is not on a CPAP machine. She tried this about 3+ years ago and could not tolerate the machine so she never started using the machine and sent it back. She will call her OSA/CPAP provider to see if she needs to start using again. If you think this will help.

## 2020-05-24 NOTE — Telephone Encounter (Signed)
Patient is calling to talk with doctor or nurse about how she woke up this morning. Please call back

## 2020-05-27 NOTE — Telephone Encounter (Signed)
Not wearing her CPAP could certainly be causing the symptoms.  Agree with following up with her sleep medicine doctor to see if there are any other alternatives.

## 2020-05-27 NOTE — Telephone Encounter (Signed)
Returned call to pt, she states that she called and made an appt for next week

## 2020-06-04 ENCOUNTER — Telehealth: Payer: Self-pay | Admitting: Cardiovascular Disease

## 2020-06-04 ENCOUNTER — Ambulatory Visit: Payer: Medicare Other | Admitting: Physical Therapy

## 2020-06-04 ENCOUNTER — Other Ambulatory Visit: Payer: Self-pay

## 2020-06-04 ENCOUNTER — Encounter: Payer: Self-pay | Admitting: Physical Therapy

## 2020-06-04 DIAGNOSIS — R42 Dizziness and giddiness: Secondary | ICD-10-CM | POA: Diagnosis not present

## 2020-06-04 DIAGNOSIS — R2681 Unsteadiness on feet: Secondary | ICD-10-CM

## 2020-06-04 NOTE — Telephone Encounter (Signed)
Returned the call to the patient. She stated that she has been waking up in the morning with palpitations. She stated that she feels like it may be from anxiety and would like Dr. Blenda Mounts opinion on that.   She sees the sleep doctor next week due to sleep apnea and is hoping that will help as well.   She has not started the Carvedilol because she wants to wait and see if the palpitations are from the sleep apnea and anxiety combination. She has been advised that stress, lack of sleep and anxiety can cause increased heart rates.   She stated that she had a weekend full of funerals and now has a friend dying from cancer. She has been advised to try relaxation methods and taking deep breaths and to find something that can help calm her nerves.

## 2020-06-04 NOTE — Telephone Encounter (Signed)
STAT if HR is under 50 or over 120 (normal HR is 60-100 beats per minute)  1) What is your heart rate? 74  2) Do you have a log of your heart rate readings (document readings)?   11/30 HR 102 & 99  3) Do you have any other symptoms? Hypertension   Jodi [name] is calling stating her heart races most morning when she first wakes up. She states it normally ranges in the 90's when it occurs, but she can feel it beating fast which gives her anxiety. She is wanting to know Dr. Blenda Mounts opinion on how much of this is anxiety related. She states when it occurred today she took half a Xanax and feels better now, but has also been under a lot of stress lately. Please advise.

## 2020-06-04 NOTE — Therapy (Addendum)
[facility] [address] [place], [place], [address] Phone: [phone]   Fax:  [phone]  Physical Therapy Treatment  Patient Details  Name: Jodi [name] MRN: [id number] Date of Birth: Jun 10, 1951 Referring Provider (PT): Carol Ada, MD   Encounter Date: 06/04/2020   PT End of Session - 06/04/20 2202    Visit Number 4    Number of Visits 4    Date for PT Re-Evaluation 05/10/20    Authorization Type [employer] Medicare    Authorization Time Period 04-18-20 - 06-18-20    PT Start Time 0803    PT Stop Time 0847    PT Time Calculation (min) 44 min    Activity Tolerance Patient tolerated treatment well    Behavior During Therapy [facility] for tasks assessed/performed           Past Medical History:  Diagnosis Date  . Abnormal Pap smear of cervix   . Anxiety   . Aortic atherosclerosis ([place])   . Diverticulitis   . Fatty liver disease, nonalcoholic   . Glaucoma   . H/O dizziness   . Hemorrhoids   . History of depression    incest survivor/dysthymia  . Hypertension   . Hyperthyroidism   . Hypokalemia 04/29/2020  . IBS (irritable bowel syndrome)    lactose intolerance  . Palpitations 04/29/2020  . Restless leg syndrome   . Sleep apnea   . Snoring 11/17/2012  . Thyroid disease    hypothyroid  . Varicose veins     Past Surgical History:  Procedure Laterality Date  . CHOLECYSTECTOMY  07/19/2017   Dr. Donne Hazel  . COLPOSCOPY  99, 00, 02, 04, 06  . ENDOVENOUS ABLATION SAPHENOUS VEIN W/ LASER Right 04-08-2015   endovenous laser ablation (right greater saphenous vein) by Victorino Dike MD    . LEEP  3/09   CIN II/III    There were no vitals filed for this visit.                           Balance Exercises - 06/05/20 0001      Balance Exercises: Standing   Standing Eyes Opened Narrow base of support (BOS);Head turns;Foam/compliant surface;5 reps;Other (comment)   5 reps horizontal and  vertical; partial tandem stance   Standing Eyes Closed Narrow base of support (BOS);Head turns;Foam/compliant surface;5 reps;Other (comment)   horizontal & vertical head turns; partial tandem stance   Rockerboard Anterior/posterior;Head turns;EO;EC;10 reps   with UE support prn   Tandem Gait Forward;2 reps   20' x 2    Turning Both;5 reps    Marching Foam/compliant surface;Head turns;Static;5 reps   horizontal and vertical   Other Standing Exercises crossovers front on blue mat 5 reps with 180 degree turn (no dizziness provoked)    Other Standing Exercises Comments amb. tossing ball straight up for 40'; then tossing ball on Rt and Lt side 40' x 2 reps;  amb. tracking ball clockwise and counterclockwise 40' x 1 rep each                  PT Long Term Goals - 06/04/20 2204      PT LONG TERM GOAL #1   Title Independent in HEP for balance and vestibular exercises.    Time 4    Period Weeks    Status New      PT LONG TERM GOAL #2   Title Pt will improve DHI score by  at least 10 points to indicate improvement in status/vertigo.    Baseline DHI score 40% (moderate handicap group) - 04-18-20    Time 4    Period Weeks    Status New                 Plan - 06/04/20 2203    Clinical Impression Statement Pt is progressing very well towards goals.  improved balance noted on compliant surface, indicative of pt increasing vestibular input in maintaining balance.  Plan D/C next session if pt continues to do well.    Personal Factors and Comorbidities Past/Current Experience;Comorbidity 1    Comorbidities h/o vestibular hypofunction, BPPV, hypokalemia    Examination-Activity Limitations Locomotion Level    Examination-Participation Restrictions Cleaning;Community Activity;Meal Prep;Shop    Stability/Clinical Decision Making Stable/Uncomplicated    Rehab Potential Good    PT Frequency 1x / week    PT Duration 4 weeks    PT Treatment/Interventions ADLs/Self Care Home  Management;Vestibular;Neuromuscular re-education;Balance training;Patient/family education;Gait training;Therapeutic activities;Therapeutic exercise    PT Next Visit Plan check HEP - x1 viewing on patterned background and balance on foam    PT Home Exercise Plan see above    Consulted and Agree with Plan of Care Patient           Patient will benefit from skilled therapeutic intervention in order to improve the following deficits and impairments:  Dizziness, Difficulty walking, Decreased balance  Visit Diagnosis: Unsteadiness on feet     Problem List Patient Active Problem List   Diagnosis Date Noted  . Palpitations 04/29/2020  . Hypokalemia 04/29/2020  . Spider veins 11/23/2019  . Edema of both legs 11/23/2019  . 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 Anxiety disorder 01/04/2015  . Essential (primary) hypertension 01/04/2015  . Hypothyroidism 01/04/2015  . Pure hypercholesterolemia 01/04/2015  . Right upper quadrant pain 01/01/2015  . Vertigo, labyrinthine 11/17/2012  . Snoring 11/17/2012    DildayJenness Corner, PT 06/05/2020, 4:18 PM  [facility] [address] [place], [place], [address] Phone: [phone]   Fax:  [phone]  Name: Jodi [name] MRN: [id number] Date of Birth: 13-Sep-1950

## 2020-06-05 NOTE — Telephone Encounter (Signed)
With all that she has going on it could certainly be anxiety.  Sleep apnea could cause it as well.  If she wants to keep waiting before trying carvedilol.  I suspect carvedilol will do a much better job of regulating her BP than metoprolol though.

## 2020-06-06 NOTE — Telephone Encounter (Signed)
Advised patient, verbalized understanding  She may start Carvedilol at 1/2 tablet twice a day

## 2020-06-06 NOTE — Telephone Encounter (Signed)
Have spoke with patient multiple times regarding medications since this message

## 2020-06-11 ENCOUNTER — Telehealth: Payer: Self-pay | Admitting: Cardiovascular Disease

## 2020-06-11 NOTE — Telephone Encounter (Signed)
Spoke with pt, she is aware her message has been sent to dr Oval Linsey. She was given the okay to try not taking it for one day and see if that makes a difference.

## 2020-06-11 NOTE — Telephone Encounter (Signed)
Pt c/o medication issue:  1. Name of Medication: amLODipine (NORVASC) 5 MG tablet  2. How are you currently taking this medication (dosage and times per day)? 1 tablet by mouth daily   3. Are you having a reaction (difficulty breathing--STAT)? Yes   4. What is your medication issue? Jodi [name] is calling stating she thinks this medication is causing her to have IBS flare ups. She states she has been having pain as well as nausea in the mornings for the past couple of days. She also states she has been watching her diet to see if it may be due to that and does not feel it is. Please advise.

## 2020-06-13 ENCOUNTER — Telehealth: Payer: Self-pay | Admitting: Internal Medicine

## 2020-06-13 NOTE — Telephone Encounter (Signed)
Pt was started on amlodipine for BP. States about the same time she started the amlodipine she started having pain in her LLQ. She thought the amlodipine was causing the pain. She called cardiology and they told her to hold it for a few days and see. She held it Tuesday evening and felt better. Last night she took the amlodipine and feels more pain today. Reports she has no fever. She has been taking tylenol for the pain but wasn't sure what Dr. Hilarie Fredrickson would recommend. Please advise.

## 2020-06-13 NOTE — Telephone Encounter (Signed)
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 It seems that there is some question as to whether amlodipine is contributing to her pain because according to this note the pain got better when she held the medication and came back when she started? She may try to hold this longer to see if the pain again resolves If the pain does not resolve I would consider treating with antibiotics for diverticulitis of the small intestine versus repeating cross-sectional imaging with CT scan.  If we used antibiotics I would use Cipro 500 mg twice daily and metronidazole 250 mg 3 times daily x7 days

## 2020-06-13 NOTE — Telephone Encounter (Signed)
Pt is requesting a call back from a nurse to discuss her left abdominal pain which has been going for 4 days, pt would like to know if this is due to taking her medication amlodipine.

## 2020-06-14 NOTE — Telephone Encounter (Signed)
Spoke with patient.  Advised that she hold amlodipine for the weekend.  She took for about a month or more before had the IBS flare that she associated with amlodipine.  Home BP readings have been mostly 130's/low 80's.    Suggested she hold amlodipine dose thru the weekend to give her IBS time to settle out, then restart on Monday.  If she doesn't want to use the amlodipine, she should use the carvedilol.  She has only taken one dose of that, at least a week ago, and notes that it made her very tired.  Assured her that this is not uncommon in the first few days, but after that most people are fine.    Patient voiced understanding.

## 2020-06-14 NOTE — Telephone Encounter (Signed)
Spoke with pt and she held the amlodipine again last night and reports she is much better today. She now thinks it was IBS, states the symptoms are more like IBS. Pt knows if she does not continue to improve or worsens to call back. She verbalized understanding.

## 2020-06-17 ENCOUNTER — Ambulatory Visit: Payer: Medicare Other | Attending: Family Medicine | Admitting: Physical Therapy

## 2020-06-17 ENCOUNTER — Other Ambulatory Visit: Payer: Self-pay

## 2020-06-17 DIAGNOSIS — R42 Dizziness and giddiness: Secondary | ICD-10-CM | POA: Diagnosis present

## 2020-06-17 DIAGNOSIS — R2681 Unsteadiness on feet: Secondary | ICD-10-CM | POA: Diagnosis present

## 2020-06-18 ENCOUNTER — Encounter: Payer: Self-pay | Admitting: Physical Therapy

## 2020-06-18 NOTE — Therapy (Signed)
[facility] [address] [place], [place], [address] Phone: [phone]   Fax:  [phone]  Physical Therapy Treatment  Patient Details  Name: Jodi [name] MRN: [id number] Date of Birth: [birth year] Referring Provider (PT): Carol Ada, MD   Encounter Date: 06/17/2020   PT End of Session - 06/18/20 1913    Visit Number 5    Number of Visits 7    Date for PT Re-Evaluation 07/19/20   renewal completed on 06-17-20 for 3 additional visits   Authorization Type [facility] Medicare    Authorization Time Period 04-18-20 - 06-18-20; 06-17-20 - 08-18-20    PT Start Time 0848    PT Stop Time 0932    PT Time Calculation (min) 44 min    Equipment Utilized During Treatment Gait belt    Activity Tolerance Patient tolerated treatment well    Behavior During Therapy [facility] for tasks assessed/performed           Past Medical History:  Diagnosis Date   Abnormal Pap smear of cervix    Anxiety    Aortic atherosclerosis ([place])    Diverticulitis    Fatty liver disease, nonalcoholic    Glaucoma    H/O dizziness    Hemorrhoids    History of depression    incest survivor/dysthymia   Hypertension    Hyperthyroidism    Hypokalemia 04/29/2020   IBS (irritable bowel syndrome)    lactose intolerance   Palpitations 04/29/2020   Restless leg syndrome    Sleep apnea    Snoring 11/17/2012   Thyroid disease    hypothyroid   Varicose veins     Past Surgical History:  Procedure Laterality Date   CHOLECYSTECTOMY  07/19/2017   Dr. Donne Hazel   COLPOSCOPY  99, 00, 02, 04, 06   ENDOVENOUS ABLATION SAPHENOUS VEIN W/ LASER Right 04-08-2015   endovenous laser ablation (right greater saphenous vein) by Victorino Dike MD     LEEP  3/09   CIN II/III    There were no vitals filed for this visit.   Subjective Assessment - 06/18/20 0805    Subjective Pt states balance was a little off this weekend - states she feels it is  related to stress, as she was stressed over the weekend; pt reports she still veers to left at times    Pertinent History hypokalemia, h/o depression, anxiety, sleep apnea    Patient Stated Goals improve balance ("get it better") - has some breathlessness - but says it has improved some every day                     NeuroRe-ed:    Pt performed balance/vestibular exercise - standing on incline and then on decline on mat on ramp; Marching in place with EO 5 reps each leg, then added horizontal and vertical head turns with EO 5 reps; marching on incline/decline with EC; added marching  With EC with horizontal and vertical head turns with CGA Alternate stepping up/back and then down/back with horizontal head turns while stepping - CGA for safety          Balance Exercises - 06/18/20 0001      Balance Exercises: Standing   Rockerboard Anterior/posterior;Head turns;EO;EC;10 reps   with UE support prn   Tandem Gait Forward;2 reps   20' x 2    Turning Both;3 reps   3 reps to Rt and Lt sides each   Marching Foam/compliant surface;Head turns;Static;5 reps  horizontal and vertical   Other Standing Exercises crossovers front on blue mat 5 reps with 180 degree turn (no dizziness provoked)    Other Standing Exercises Comments amb. tossing ball straight up for 40'; then tossing ball on Rt and Lt side 40' x 2 reps;  amb. tracking ball clockwise and counterclockwise 40' x 1 rep each                  PT Long Term Goals - 06/17/20 0854      PT LONG TERM GOAL #1   Title Independent in HEP for balance and vestibular exercises.    Baseline met 06-17-20    Time 4    Period Weeks    Status Achieved      PT LONG TERM GOAL #2   Title Pt will improve DHI score by at least 10 points to indicate improvement in status/vertigo.    Baseline DHI score 40% (moderate handicap group) - 04-18-20    Time 4    Period Weeks    Status On-going    Target Date 07/19/20                  Plan - 06/18/20 1916    Clinical Impression Statement Pt has met LTG #1 and LTG #2 remains ongoing as pt will continue PT for 3 additional sessions to address balance and vestibular deficits.  Pt reports she does not feel balance is as good as it was 2 yrs ago at previous OP admission, however, pt does well with activities in clinic and demonstrates ability to recover LOB independently.    Personal Factors and Comorbidities Past/Current Experience;Comorbidity 1    Comorbidities h/o vestibular hypofunction, BPPV, hypokalemia    Examination-Activity Limitations Locomotion Level    Examination-Participation Restrictions Cleaning;Community Activity;Meal Prep;Shop    Stability/Clinical Decision Making Stable/Uncomplicated    Rehab Potential Good    PT Frequency 1x / week    PT Duration 4 weeks    PT Treatment/Interventions ADLs/Self Care Home Management;Vestibular;Neuromuscular re-education;Balance training;Patient/family education;Gait training;Therapeutic activities;Therapeutic exercise    PT Next Visit Plan check LTG's - SOT and DHI - D/C next session?    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 [place], [place], [address] Phone: [phone]   Fax:  [phone]  Name: Jodi [name] MRN: [id number] Date of Birth: 11/27/1950

## 2020-06-25 ENCOUNTER — Ambulatory Visit: Payer: Medicare Other | Admitting: Cardiovascular Disease

## 2020-06-25 ENCOUNTER — Other Ambulatory Visit: Payer: Self-pay

## 2020-06-25 ENCOUNTER — Encounter: Payer: Self-pay | Admitting: Cardiovascular Disease

## 2020-06-25 VITALS — BP 142/82 | HR 94 | Ht 65.5 in | Wt 183.8 lb

## 2020-06-25 DIAGNOSIS — E7800 Pure hypercholesterolemia, unspecified: Secondary | ICD-10-CM

## 2020-06-25 DIAGNOSIS — I10 Essential (primary) hypertension: Secondary | ICD-10-CM | POA: Diagnosis not present

## 2020-06-25 DIAGNOSIS — R002 Palpitations: Secondary | ICD-10-CM | POA: Diagnosis not present

## 2020-06-25 NOTE — Patient Instructions (Signed)
Medication Instructions:  USE CARVEDILOL 6.25 MG AS NEEDED FOR ELEVATED BLOOD PRESSURE/PALPITATIONS   STOP POTASSIUM   *If you need a refill on your cardiac medications before your next appointment, please call your pharmacy*  Lab Work: NONE   Testing/Procedures: NONE   Follow-Up: At [facility], you and your health needs are our priority.  As part of our continuing mission to provide you with exceptional heart care, we have created designated Provider Care Teams.  These Care Teams include your primary Cardiologist (physician) and Advanced Practice Providers (APPs -  Physician Assistants and Nurse Practitioners) who all work together to provide you with the care you need, when you need it.  We recommend signing up for the patient portal called "MyChart".  Sign up information is provided on this After Visit Summary.  MyChart is used to connect with patients for Virtual Visits (Telemedicine).  Patients are able to view lab/test results, encounter notes, upcoming appointments, etc.  Non-urgent messages can be sent to your provider as well.   To learn more about what you can do with MyChart, go to [email or web address].    Your next appointment:   3-4 month(s)  The format for your next appointment:   In Person  Provider:   You may see [facility]  or one of the following Advanced Practice Providers on your designated Care Team:    Kerin Ransom, PA-C  [place], [place]  Coletta Memos, [place]  Other Instructions  SOMEONE FROM THE PREP TEAM WILL REACH OUT TO YOU REGARDING [employer] PROGRAM   DASH Eating Plan DASH stands for "Dietary Approaches to Stop Hypertension." The DASH eating plan is a healthy eating plan that has been shown to reduce high blood pressure (hypertension). It may also reduce your risk for type 2 diabetes, heart disease, and stroke. The DASH eating plan may also help with weight loss. What are tips for following this plan?  General guidelines  Avoid eating more  than 2,300 mg (milligrams) of salt (sodium) a day. If you have hypertension, you may need to reduce your sodium intake to 1,500 mg a day.  Limit alcohol intake to no more than 1 drink a day for nonpregnant women and 2 drinks a day for men. One drink equals 12 oz of beer, 5 oz of wine, or 1 oz of hard liquor.  Work with your health care provider to maintain a healthy body weight or to lose weight. Ask what an ideal weight is for you.  Get at least 30 minutes of exercise that causes your heart to beat faster (aerobic exercise) most days of the week. Activities may include walking, swimming, or biking.  Work with your health care provider or diet and nutrition specialist (dietitian) to adjust your eating plan to your individual calorie needs. Reading food labels   Check food labels for the amount of sodium per serving. Choose foods with less than 5 percent of the Daily Value of sodium. Generally, foods with less than 300 mg of sodium per serving fit into this eating plan.  To find whole grains, look for the word "whole" as the first word in the ingredient list. Shopping  Buy products labeled as "low-sodium" or "no salt added."  Buy fresh foods. Avoid canned foods and premade or frozen meals. Cooking  Avoid adding salt when cooking. Use salt-free seasonings or herbs instead of table salt or sea salt. Check with your health care provider or pharmacist before using salt substitutes.  Do not fry foods. Cook foods  using healthy methods such as baking, boiling, grilling, and broiling instead.  Cook with heart-healthy oils, such as olive, canola, soybean, or sunflower oil. Meal planning  Eat a balanced diet that includes: ? 5 or more servings of fruits and vegetables each day. At each meal, try to fill half of your plate with fruits and vegetables. ? Up to 6-8 servings of whole grains each day. ? Less than 6 oz of lean meat, poultry, or fish each day. A 3-oz serving of meat is about the same  size as a deck of cards. One egg equals 1 oz. ? 2 servings of low-fat dairy each day. ? A serving of nuts, seeds, or beans 5 times each week. ? Heart-healthy fats. Healthy fats called Omega-3 fatty acids are found in foods such as flaxseeds and coldwater fish, like sardines, salmon, and mackerel.  Limit how much you eat of the following: ? Canned or prepackaged foods. ? Food that is high in trans fat, such as fried foods. ? Food that is high in saturated fat, such as fatty meat. ? Sweets, desserts, sugary drinks, and other foods with added sugar. ? Full-fat dairy products.  Do not salt foods before eating.  Try to eat at least 2 vegetarian meals each week.  Eat more home-cooked food and less restaurant, buffet, and fast food.  When eating at a restaurant, ask that your food be prepared with less salt or no salt, if possible. What foods are recommended? The items listed may not be a complete list. Talk with your dietitian about what dietary choices are best for you. Grains Whole-grain or whole-wheat bread. Whole-grain or whole-wheat pasta. Brown rice. [place]. Bulgur. Whole-grain and low-sodium cereals. Pita bread. Low-fat, low-sodium crackers. Whole-wheat flour tortillas. Vegetables Fresh or frozen vegetables (raw, steamed, roasted, or grilled). Low-sodium or reduced-sodium tomato and vegetable juice. Low-sodium or reduced-sodium tomato sauce and tomato paste. Low-sodium or reduced-sodium canned vegetables. Fruits All fresh, dried, or frozen fruit. Canned fruit in natural juice (without added sugar). Meat and other protein foods Skinless chicken or [place]. Ground chicken or [place]. Pork with fat trimmed off. Fish and seafood. Egg whites. Dried beans, peas, or lentils. Unsalted nuts, nut butters, and seeds. Unsalted canned beans. Lean cuts of beef with fat trimmed off. Low-sodium, lean deli meat. Dairy Low-fat (1%) or fat-free (skim) milk. Fat-free, low-fat, or reduced-fat  cheeses. Nonfat, low-sodium ricotta or cottage cheese. Low-fat or nonfat yogurt. Low-fat, low-sodium cheese. Fats and oils Soft margarine without trans fats. Vegetable oil. Low-fat, reduced-fat, or light mayonnaise and salad dressings (reduced-sodium). Canola, safflower, olive, soybean, and sunflower oils. Avocado. Seasoning and other foods Herbs. Spices. Seasoning mixes without salt. Unsalted popcorn and pretzels. Fat-free sweets. What foods are not recommended? The items listed may not be a complete list. Talk with your dietitian about what dietary choices are best for you. Grains Baked goods made with fat, such as croissants, muffins, or some breads. Dry pasta or rice meal packs. Vegetables Creamed or fried vegetables. Vegetables in a cheese sauce. Regular canned vegetables (not low-sodium or reduced-sodium). Regular canned tomato sauce and paste (not low-sodium or reduced-sodium). Regular tomato and vegetable juice (not low-sodium or reduced-sodium). Angie Fava. Olives. Fruits Canned fruit in a light or heavy syrup. Fried fruit. Fruit in cream or butter sauce. Meat and other protein foods Fatty cuts of meat. Ribs. Fried meat. Berniece Salines. Sausage. Bologna and other processed lunch meats. Salami. Fatback. Hotdogs. Bratwurst. Salted nuts and seeds. Canned beans with added salt. Canned or smoked fish.  Whole eggs or egg yolks. Chicken or [place] with skin. Dairy Whole or 2% milk, cream, and half-and-half. Whole or full-fat cream cheese. Whole-fat or sweetened yogurt. Full-fat cheese. Nondairy creamers. Whipped toppings. Processed cheese and cheese spreads. Fats and oils Butter. Stick margarine. Lard. Shortening. Ghee. Bacon fat. Tropical oils, such as coconut, palm kernel, or palm oil. Seasoning and other foods Salted popcorn and pretzels. Onion salt, garlic salt, seasoned salt, table salt, and sea salt. Worcestershire sauce. Tartar sauce. Barbecue sauce. Teriyaki sauce. Soy sauce, including  reduced-sodium. Steak sauce. Canned and packaged gravies. Fish sauce. Oyster sauce. Cocktail sauce. Horseradish that you find on the shelf. Ketchup. Mustard. Meat flavorings and tenderizers. Bouillon cubes. Hot sauce and Tabasco sauce. Premade or packaged marinades. Premade or packaged taco seasonings. Relishes. Regular salad dressings. Where to find more information:  National Heart, Lung, and [place]: https://wilson-eaton.com/  American Heart Association: www.heart.org Summary  The DASH eating plan is a healthy eating plan that has been shown to reduce high blood pressure (hypertension). It may also reduce your risk for type 2 diabetes, heart disease, and stroke.  With the DASH eating plan, you should limit salt (sodium) intake to 2,300 mg a day. If you have hypertension, you may need to reduce your sodium intake to 1,500 mg a day.  When on the DASH eating plan, aim to eat more fresh fruits and vegetables, whole grains, lean proteins, low-fat dairy, and heart-healthy fats.  Work with your health care provider or diet and nutrition specialist (dietitian) to adjust your eating plan to your individual calorie needs. This information is not intended to replace advice given to you by your health care provider. Make sure you discuss any questions you have with your health care provider. Document Revised: 06/04/2017 Document Reviewed: 06/15/2016 Elsevier Patient Education  2020 [employer].

## 2020-06-25 NOTE — Progress Notes (Signed)
Cardiology Office Note   Date:  06/25/2020   ID:  Jodi [name], DOB [birth year], MRN [id number]  PCP:  Carol Ada, MD  Cardiologist:   Skeet Latch, MD   No chief complaint on file.   History of Present Illness: Jodi [name] is a 69 y.o. female with OSA not on CPAP, hypertension and anxiety here for follow up.  She was initially seen for the evaluation of palpitations and shortness of breath.  In early September she was talking to a neighbor and when she stood up she felt like her heart rate stopped and then she felt presyncopal but didn't pass out.  She reports four similar episodes.  She went to the ED each time.  The first time her potassium was noted to be low so she was given a potassium supplement.  Her potassium was 2.9.  She reports being on HCTZ for years.  She notes an occasional fluttering.  She also feels short of breath at times when it occurs.  She didn't feel it while wearing the monitor at the hospital.  She notes that her heart beats hard when she tries to exert herself.  She likes to walk a lot for exercise.  However now, even with carrying laundry up and down steps she is more short of breath than usual.  The symptoms mostly improved after she started potassium.  She continues to have lightheadedness if she bent over.  She saw her PCP and her BP was elevated so she was started on olmesartan.  However she thinks that she has white coat syndrome.  She noted vertigo so she stopped taking it.  She notes that she worries a lot about her health and gets anxious thinking about it.  She hears her heat when trying to sleep.  It seems to be regular.  She has no edema, orthopnea or PND.    At her last appointment Jodi [name] had orthostatic symptoms and palpitations.  Her potassium was also low.  She we switched from HCTZ to amlodipine.  She stopped amlodipine because it irritated her IBS.  She tried 1 dose of carvedilol and stopped it because it made her very sleepy.  She had  an ETT 04/2020 that was negative.  She achieved 7 METs on a Bruce protocol.  She also wore an ambulatory monitor that revealed PACs and sinus tachcyardia that corresponded to episodes of heart fluttering.  It was recommeded that she stat carvedilol but felt fatigued.  She has OSA and has been off her CPAP machine for at least three years. She had a repeat sleep study and was negative for PE.  Overall she thinks her palpitations are improving.  She acknowledges that her anxiety plays a big part in the palpitations.  Hypokalemia was contributing as well.  Now that her potassium is normal her symptoms have improved.  She no longer has orthostatic symptoms since stopping her diuretic.  She has not noticed any increased lower extremity edema, orthopnea, or PND.  She notes some palpitations when she bends over.  She has some palpitations in the mornings but it is improving with stretching and relaxing.     Past Medical History:  Diagnosis Date  . Abnormal Pap smear of cervix   . Anxiety   . Aortic atherosclerosis ([place])   . Diverticulitis   . Fatty liver disease, nonalcoholic   . Glaucoma   . H/O dizziness   . Hemorrhoids   . History of depression  incest survivor/dysthymia  . Hypertension   . Hyperthyroidism   . Hypokalemia 04/29/2020  . IBS (irritable bowel syndrome)    lactose intolerance  . Palpitations 04/29/2020  . Restless leg syndrome   . Sleep apnea   . Snoring 11/17/2012  . Thyroid disease    hypothyroid  . Varicose veins     Past Surgical History:  Procedure Laterality Date  . CHOLECYSTECTOMY  07/19/2017   Dr. Donne Hazel  . COLPOSCOPY  99, 00, 02, 04, 06  . ENDOVENOUS ABLATION SAPHENOUS VEIN W/ LASER Right 04-08-2015   endovenous laser ablation (right greater saphenous vein) by Victorino Dike MD    . LEEP  3/09   CIN II/III     Current Outpatient Medications  Medication Sig Dispense Refill  . albuterol (VENTOLIN HFA) 108 (90 Base) MCG/ACT inhaler 2 puffs as needed    .  ALPRAZolam (XANAX) 0.25 MG tablet Take 0.125 mg by mouth every 6 (six) hours as needed for anxiety.    . bismuth subsalicylate (PEPTO BISMOL) 262 MG chewable tablet Chew 524 mg by mouth as needed for diarrhea or loose stools.    . carvedilol (COREG) 12.5 MG tablet Take 12.5 mg by mouth as needed. TAKE 1/2 TABLET AS NEEDED FOR PALPITATIONS/ELEVATED BLOOD PRESSURE    . EPINEPHrine 0.3 mg/0.3 mL IJ SOAJ injection Inject 0.3 mg into the muscle as needed for anaphylaxis.   0  . fluticasone (FLONASE) 50 MCG/ACT nasal spray Place 2 sprays into both nostrils daily as needed for allergies.     . hydrocortisone (ANUSOL-HC) 2.5 % rectal cream Place 1 application rectally 4 (four) times daily as needed for hemorrhoids or anal itching.    . lactase (LACTAID) 3000 units tablet Take 3,000 Units by mouth as needed (lactaid).     Marland Kitchen levothyroxine (SYNTHROID) 112 MCG tablet Take 112 mcg by mouth daily.    . Multiple Vitamin (MULTIVITAMIN WITH MINERALS) TABS Take 1 tablet by mouth daily.    Marland Kitchen saccharomyces boulardii (FLORASTOR) 250 MG capsule Take one tablet BID x 1 month 60 capsule 0  . timolol (TIMOPTIC) 0.5 % ophthalmic solution Place 1 drop into both eyes every morning.  3  . Vitamins A & D (VITAMIN A & D) ointment Apply 1 application topically as needed (hemmroids).     No current facility-administered medications for this visit.    Allergies:   Sulfites, Amlodipine, Iodine, Macrodantin [nitrofurantoin macrocrystal], Serotonin reuptake inhibitors (ssris), Shellfish allergy, Venlafaxine, Almond oil, and Bentyl [dicyclomine hcl]    Social History:  The patient  reports that she quit smoking about 41 years ago. She has never used smokeless tobacco. She reports previous alcohol use. She reports that she does not use drugs.   Family History:  The patient's family history includes Breast cancer in her cousin; Colon polyps in her brother and father; Dementia in her brother, father, and mother; Diabetes in her  brother, father, and paternal grandmother; Heart disease in her maternal uncle; Hyperlipidemia in her father; Hypertension in her father and mother; Parkinson's disease in her mother; Prostate cancer in her father; Seizures in her brother.    ROS:  Please see the history of present illness.   Otherwise, review of systems are positive for none.   All other systems are reviewed and negative.    PHYSICAL EXAM: VS:  BP (!) 142/82   Pulse 94   Ht 5' 5.5" (1.664 m)   Wt 183 lb 12.8 oz (83.4 kg)   LMP 07/06/2010   SpO2  94%   BMI 30.12 kg/m  , BMI Body mass index is 30.12 kg/m. GENERAL:  Well appearing HEENT: Pupils equal round and reactive, fundi not visualized, oral mucosa unremarkable NECK:  No jugular venous distention, waveform within normal limits, carotid upstroke brisk and symmetric, no bruits LUNGS:  Clear to auscultation bilaterally HEART:  RRR.  PMI not displaced or sustained,S1 and S2 within normal limits, no S3, no S4, no clicks, no rubs, no murmurs ABD:  Flat, positive bowel sounds normal in frequency in pitch, no bruits, no rebound, no guarding, no midline pulsatile mass, no hepatomegaly, no splenomegaly EXT:  2 plus pulses throughout, no edema, no cyanosis no clubbing SKIN:  No rashes no nodules NEURO:  Cranial nerves II through XII grossly intact, motor grossly intact throughout PSYCH:  Cognitively intact, oriented to person place and time   EKG:  EKG is not ordered today. 04/23/2020: Sinus rhythm.  Rate 96 bpm.  Low voltage precordial leads.  ETT 05/01/20:  Upsloping ST segment depression ST segment depression was noted during stress in the II, III, aVF, V6, V5, V4 and V3 leads.  Blood pressure demonstrated a normal response to exercise.  Negative, adequate stress test.  7 Day Event Monitor 05/2020:  Quality: Fair.  Baseline artifact. Predominant rhythm: sinus rhythm Average heart rate: 85 bpm Max heart rate: 140 bpm Min heart rate: 58 bpm Pauses >2.5 seconds:  none Occasional PACs 8 beat run of atrial tachycardia  Patient reported heart fluttering.  At these times, sinus rhythm, PACs and sinus tachycardia were noted.   Recent Labs: 04/10/2020: Magnesium 2.1; TSH 0.616 04/23/2020: ALT 27; B Natriuretic Peptide 21.5 04/27/2020: Hemoglobin 14.9; Platelets 175 05/15/2020: BUN 8; Creatinine, Ser 0.37; Potassium 4.0; Sodium 141    Lipid Panel No results found for: CHOL, TRIG, HDL, CHOLHDL, VLDL, LDLCALC, LDLDIRECT    Wt Readings from Last 3 Encounters:  06/25/20 183 lb 12.8 oz (83.4 kg)  05/09/20 184 lb (83.5 kg)  04/29/20 182 lb (82.6 kg)      ASSESSMENT AND PLAN:  # Palpitations:  # Shortness of breath: # Lightheadedness: # Hypertension:  # Exertional dyspnea: Symptoms have all improved.  Anxiety was contributing heavily.  She has been unable to tolerate anxiety medication.  She also did not tolerate amlodipine or carvedilol.  She does still have the carvedilol and can take a half dose as needed if she has persistent palpitations or if her blood pressure is especially high.  She would like some instruction on exercise and diet.  We have provided her with the DASH diet.  We will also refer her to the PrEP program through the [facility].  She would like to try working on diet and exercise rather than starting a new medication at this time.  She is going to work on exercising at least 150 minutes weekly.  If her blood pressure remains elevated at follow-up will consider adding spironolactone or an ARB.  She no longer needs potassium supplementation.   Current medicines are reviewed at length with the patient today.  The patient does not have concerns regarding medicines.  The following changes have been made: none   Labs/ tests ordered today include:  No orders of the defined types were placed in this encounter.    Disposition:   FU with Shawnese Magner C. Oval Linsey, MD, [facility] in 3-4 months.   Signed, Diallo Ponder C. Oval Linsey, MD, [facility]  06/25/2020  4:48 PM    [place]

## 2020-07-01 ENCOUNTER — Encounter: Payer: Self-pay | Admitting: Physical Therapy

## 2020-07-01 ENCOUNTER — Other Ambulatory Visit: Payer: Self-pay

## 2020-07-01 ENCOUNTER — Ambulatory Visit: Payer: Medicare Other | Admitting: Physical Therapy

## 2020-07-01 DIAGNOSIS — R2681 Unsteadiness on feet: Secondary | ICD-10-CM

## 2020-07-01 DIAGNOSIS — R42 Dizziness and giddiness: Secondary | ICD-10-CM

## 2020-07-02 ENCOUNTER — Encounter: Payer: Self-pay | Admitting: Physical Therapy

## 2020-07-02 NOTE — Therapy (Signed)
[facility] [address] [place], [place], [address] Phone: [phone]   Fax:  [phone]  Physical Therapy Treatment & Discharge Summary  Patient Details  Name: SEREENA MARANDO MRN: [id number] Date of Birth: 10-28-1950 Referring Provider (PT): Carol Ada, MD   Encounter Date: 07/01/2020   PT End of Session - 07/02/20 2039    Visit Number 6    Number of Visits 7    Date for PT Re-Evaluation 07/19/20   renewal completed on 06-17-20 for 3 additional visits   Authorization Type [facility] Medicare    Authorization Time Period 04-18-20 - 06-18-20; 06-17-20 - 08-18-20    PT Start Time 0931    PT Stop Time 1017    PT Time Calculation (min) 46 min    Equipment Utilized During Treatment --   harness vest used with SOT   Activity Tolerance Patient tolerated treatment well    Behavior During Therapy [facility] for tasks assessed/performed           Past Medical History:  Diagnosis Date  . Abnormal Pap smear of cervix   . Anxiety   . Aortic atherosclerosis ([place])   . Diverticulitis   . Fatty liver disease, nonalcoholic   . Glaucoma   . H/O dizziness   . Hemorrhoids   . History of depression    incest survivor/dysthymia  . Hypertension   . Hyperthyroidism   . Hypokalemia 04/29/2020  . IBS (irritable bowel syndrome)    lactose intolerance  . Palpitations 04/29/2020  . Restless leg syndrome   . Sleep apnea   . Snoring 11/17/2012  . Thyroid disease    hypothyroid  . Varicose veins     Past Surgical History:  Procedure Laterality Date  . CHOLECYSTECTOMY  07/19/2017   Dr. Donne Hazel  . COLPOSCOPY  99, 00, 02, 04, 06  . ENDOVENOUS ABLATION SAPHENOUS VEIN W/ LASER Right 04-08-2015   endovenous laser ablation (right greater saphenous vein) by Victorino Dike MD    . LEEP  3/09   CIN II/III    There were no vitals filed for this visit.     Sensory Organization Test - composite score 71/100 (N= 68/100)  Somatosensory  WNL's Visual input 76/100 with N= 80/100 (slightly decr.) Vestibular input 49/100 with N= 55/100 (slightly decr.)  Condition 1 - all 3 trials WNL's Condition 2 - all 3 trials WNL's Condition 3 - all 3 trials WNL's Condition 4 - trials 1 & 2 below N;  Trial 3 WNL's Condition 5 - trials 1 and 2 below N; trial 3 WNL's Condition 6 - all 3 trials WNL's    Pt amb. 34' with horizontal head turns with min. Deviation in path; less sway noted on 2nd rep                   PT Education - 07/02/20 2037    Education Details reviewed and compared results of SOT with previous test scores; reviewed HEP, LTG's and progress towards goals    Person(s) Educated Patient    Methods Explanation;Handout   copy of SOT given to pt   Comprehension Verbalized understanding               PT Long Term Goals - 07/01/20 0939      PT LONG TERM GOAL #1   Title Independent in HEP for balance and vestibular exercises.    Baseline met 06-17-20    Time 4    Period Weeks  Status Achieved      PT LONG TERM GOAL #2   Title Pt will improve DHI score by at least 10 points to indicate improvement in status/vertigo.    Baseline DHI score 40% (moderate handicap group) - 04-18-20; 12% on 07-01-20    Time 4    Period Weeks    Status Achieved                 Plan - 07/02/20 2042    Clinical Impression Statement Pt has met LTG's #1 and 2:  DHI score has improved from 40% (moderate handicap) to 12% (mild handicap).  Pt's SOT composite score is 71/100 (WNL's); somatosensory input is WNL's with visual and vestibular inputs slightly decreased from the norm.  Pt is discharged due to goals met.    Personal Factors and Comorbidities Past/Current Experience;Comorbidity 1    Comorbidities h/o vestibular hypofunction, BPPV, hypokalemia    Examination-Activity Limitations Locomotion Level    Examination-Participation Restrictions Cleaning;Community Activity;Meal Prep;Shop    Stability/Clinical  Decision Making Stable/Uncomplicated    Rehab Potential Good    PT Frequency 1x / week    PT Duration 4 weeks    PT Treatment/Interventions ADLs/Self Care Home Management;Vestibular;Neuromuscular re-education;Balance training;Patient/family education;Gait training;Therapeutic activities;Therapeutic exercise    PT Next Visit Plan N/A    PT Home Exercise Plan see above    Consulted and Agree with Plan of Care Patient           Patient will benefit from skilled therapeutic intervention in order to improve the following deficits and impairments:  Dizziness,Difficulty walking,Decreased balance  Visit Diagnosis: Unsteadiness on feet  Dizziness and giddiness     Problem List Patient Active Problem List   Diagnosis Date Noted  . Palpitations 04/29/2020  . Hypokalemia 04/29/2020  . Spider veins 11/23/2019  . Edema of both legs 11/23/2019  . 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 Anxiety disorder 01/04/2015  . Essential (primary) hypertension 01/04/2015  . Hypothyroidism 01/04/2015  . Pure hypercholesterolemia 01/04/2015  . Right upper quadrant pain 01/01/2015  . Vertigo, labyrinthine 11/17/2012  . Snoring 11/17/2012     PHYSICAL THERAPY DISCHARGE SUMMARY  Visits from Start of Care: 6  Current functional level related to goals / functional outcomes: See above for progress towards goals (vertigo has resolved)   Remaining deficits: Minimally decreased vestibular input in maintaining balance Minimally decr. High level balance skills   Education / Equipment: Pt has been instructed in HEP for balance and vestibular exercises Plan: Patient agrees to discharge.  Patient goals were met. Patient is being discharged due to meeting the stated rehab goals.  ?????        Alda Lea, PT 07/02/2020, 8:49 PM  [facility] [address] [place], [place],  [address] Phone: [phone]   Fax:  [phone]  Name: AMIYRAH LAMERE MRN: [id number] Date of Birth: [birth year]

## 2020-07-15 NOTE — Progress Notes (Signed)
[facility]   Patient Details  Name: Jodi [name] MRN: [id number] Date of Birth: 1951/03/01 Age: 70 y.o. PCP: Carol Ada, MD  Vitals:   07/15/20 1203  BP: (!) 146/90  Pulse: 94  SpO2: 97%  Weight: 189 lb (85.7 kg)  Height: 5' 5.5" (1.664 m)      [facility] Eval - 07/15/20 1200      Referral    Referring Provider Oval Linsey    Reason for referral Hypertension    Program Start Date 07/23/20   T/TH 1p-215pm     Measurement   Waist Circumference 38.5 inches    Hip Circumference 43 inches    Body fat 42.3 percent      Information for Trainer   Goals goal weight 170, get rid fo palpitations, build strength and endurance    Current Exercise walks every day    Orthopedic Concerns none    Pertinent Medical History palpitations, low blood sugar? prediabetic, HTN    Restrictions/Precautions Diabetic snack before exercise      Timed Up and Go (TUGS)   Timed Up and Go Low risk <9 seconds      Mobility and Daily Activities   I find it easy to walk up or down two or more flights of stairs. 4    I have no trouble taking out the trash. 4    I do housework such as vacuuming and dusting on my own without difficulty. 4    I can easily lift a gallon of milk (8lbs). 4    I can easily walk a mile. 4    I have no trouble reaching into high cupboards or reaching down to pick up something from the floor. 4    I do not have trouble doing out-door work such as Armed forces logistics/support/administrative officer, raking leaves, or gardening. 4      Mobility and Daily Activities   I feel younger than my age. 2    I feel independent. 4    I feel energetic. 2    I live an active life.  2    I feel strong. 2    I feel healthy. 2    I feel active as other people my age. 4      How fit and strong are you.   Fit and Strong Total Score 46          Past Medical History:  Diagnosis Date  . Abnormal Pap smear of cervix   . Anxiety   . Aortic atherosclerosis ([place])   . Diverticulitis   . Fatty  liver disease, nonalcoholic   . Glaucoma   . H/O dizziness   . Hemorrhoids   . History of depression    incest survivor/dysthymia  . Hypertension   . Hyperthyroidism   . Hypokalemia 04/29/2020  . IBS (irritable bowel syndrome)    lactose intolerance  . Palpitations 04/29/2020  . Restless leg syndrome   . Sleep apnea   . Snoring 11/17/2012  . Thyroid disease    hypothyroid  . Varicose veins    Past Surgical History:  Procedure Laterality Date  . CHOLECYSTECTOMY  07/19/2017   Dr. Donne Hazel  . COLPOSCOPY  99, 00, 02, 04, 06  . ENDOVENOUS ABLATION SAPHENOUS VEIN W/ LASER Right 04-08-2015   endovenous laser ablation (right greater saphenous vein) by Victorino Dike MD    . LEEP  3/09   CIN II/III   Social History   Tobacco Use  Smoking  Status Former Smoker  . Quit date: 09/21/1978  . Years since quitting: 41.8  Smokeless Tobacco Never Used  Tobacco Comment   quit 30 years ago    Encouraged to take and record BP. She may find it motivating to watch her BP improve    Barnett Hatter 07/15/2020, 12:09 PM

## 2020-07-19 ENCOUNTER — Telehealth: Payer: Self-pay

## 2020-07-19 NOTE — Telephone Encounter (Signed)
Call placed to patient reference change of date of PREP to 2/8 T/TH 1pm-215pm Agreeable to starting then

## 2020-07-23 ENCOUNTER — Telehealth: Payer: Self-pay | Admitting: Cardiovascular Disease

## 2020-07-23 DIAGNOSIS — I10 Essential (primary) hypertension: Secondary | ICD-10-CM

## 2020-07-23 NOTE — Telephone Encounter (Signed)
Spoke with patient. Patient would like to have her blood pressure professionally checked in the office. She states she suffers from anxiety and has hyped herself up today and cannot check it. She says this occurs every time she checks it and would just like someone else to do it. Informed patient office is closed today but that a message will be sent to [facility] to see if she can check patient's blood pressure some time this week. Informed patient Rip Harbour is out of the office today. Did request that if Rip Harbour is able to do a nurse visit to check blood pressure that patient bring her machine with her so that she may check her blood pressure first and then Rip Harbour can check behind her. Patient verbalized understanding. Will route to [facility] for review.

## 2020-07-23 NOTE — Telephone Encounter (Signed)
Patient states she would like to know if she can come to the office to get her BP checked. She states she has trouble getting an accurate reading.

## 2020-07-24 NOTE — Telephone Encounter (Signed)
Scheduled appointment with Pharm D for tomorrow Blood pressure while talking to patient today 159/90

## 2020-07-25 ENCOUNTER — Ambulatory Visit (INDEPENDENT_AMBULATORY_CARE_PROVIDER_SITE_OTHER): Payer: Medicare Other | Admitting: Pharmacist

## 2020-07-25 ENCOUNTER — Other Ambulatory Visit: Payer: Self-pay

## 2020-07-25 VITALS — BP 124/78 | HR 88 | Wt 189.2 lb

## 2020-07-25 DIAGNOSIS — I10 Essential (primary) hypertension: Secondary | ICD-10-CM | POA: Diagnosis not present

## 2020-07-25 DIAGNOSIS — H401112 Primary open-angle glaucoma, right eye, moderate stage: Secondary | ICD-10-CM | POA: Diagnosis not present

## 2020-07-25 DIAGNOSIS — H401121 Primary open-angle glaucoma, left eye, mild stage: Secondary | ICD-10-CM | POA: Diagnosis not present

## 2020-07-25 DIAGNOSIS — H53431 Sector or arcuate defects, right eye: Secondary | ICD-10-CM | POA: Diagnosis not present

## 2020-07-25 NOTE — Progress Notes (Signed)
Patient ID: Jodi [name]                 DOB: [birth year]                      MRN: [id number]     HPI:  Jodi [name] is a 70 y.o. female referred by Dr. Oval Linsey to HTN clinic. PMH includes issues with low potassium, anxiety, fatty liver, glaucoma, hypertension, hyperthyroidism, IBS, and sleep apnea. Patient was referred to PREP program start Feb/8th, and recommended to initiate DASH diet. Patient presents to check BP per own request. No taking any BP medication at this time. Last dose of carvedilol was over 2 weeks ago.  Current HTN meds:  Carvedilol 12.5mg  as needed for palpitation  Previously tried:  HCTZ - low electrolytes and syncopal episode Amlodipine - IBS Carvedilol makes her sleepy  BP goal: <130/80  Family History: The patient's family history includes Breast cancer in her cousin; Colon polyps in her brother and father; Dementia in her brother, father, and mother; Diabetes in her brother, father, and paternal grandmother; Heart disease in her maternal uncle; Hyperlipidemia in her father; Hypertension in her father and mother; Parkinson's disease in her mother; Prostate cancer in her father; Seizures in her brother.   Social History: The patient  reports that she quit smoking about 41 years ago. She has never used smokeless tobacco. She reports previous alcohol use. She reports that she does not use drugs.   Diet: eating fruits and vegetables. 3 days very serious about DASH diet  Exercise: 45 minutes walk  Home BP readings:  *[facility] arm cuff - accurate    Wt Readings from Last 3 Encounters:  07/25/20 189 lb 3.2 oz (85.8 kg)  07/15/20 189 lb (85.7 kg)  06/25/20 183 lb 12.8 oz (83.4 kg)   BP Readings from Last 3 Encounters:  07/25/20 124/78  07/15/20 (!) 146/90  06/25/20 (!) 142/82   Pulse Readings from Last 3 Encounters:  07/25/20 88  07/15/20 94  06/25/20 94    Past Medical History:  Diagnosis Date  . Abnormal Pap smear of cervix   . Anxiety   . Aortic  atherosclerosis ([place])   . Diverticulitis   . Fatty liver disease, nonalcoholic   . Glaucoma   . H/O dizziness   . Hemorrhoids   . History of depression    incest survivor/dysthymia  . Hypertension   . Hyperthyroidism   . Hypokalemia 04/29/2020  . IBS (irritable bowel syndrome)    lactose intolerance  . Palpitations 04/29/2020  . Restless leg syndrome   . Sleep apnea   . Snoring 11/17/2012  . Thyroid disease    hypothyroid  . Varicose veins     Current Outpatient Medications on File Prior to Visit  Medication Sig Dispense Refill  . albuterol (VENTOLIN HFA) 108 (90 Base) MCG/ACT inhaler 2 puffs as needed    . ALPRAZolam (XANAX) 0.25 MG tablet Take 0.125 mg by mouth every 6 (six) hours as needed for anxiety.    . bismuth subsalicylate (PEPTO BISMOL) 262 MG chewable tablet Chew 524 mg by mouth as needed for diarrhea or loose stools.    . carvedilol (COREG) 12.5 MG tablet Take 12.5 mg by mouth as needed. TAKE 1/2 TABLET AS NEEDED FOR PALPITATIONS/ELEVATED BLOOD PRESSURE    . EPINEPHrine 0.3 mg/0.3 mL IJ SOAJ injection Inject 0.3 mg into the muscle as needed for anaphylaxis.   0  . fluticasone (FLONASE) 50 MCG/ACT nasal  spray Place 2 sprays into both nostrils daily as needed for allergies.     . hydrocortisone (ANUSOL-HC) 2.5 % rectal cream Place 1 application rectally 4 (four) times daily as needed for hemorrhoids or anal itching.    . lactase (LACTAID) 3000 units tablet Take 3,000 Units by mouth as needed (lactaid).     Marland Kitchen levothyroxine (SYNTHROID) 112 MCG tablet Take 112 mcg by mouth daily.    . Multiple Vitamin (MULTIVITAMIN WITH MINERALS) TABS Take 1 tablet by mouth daily.    Marland Kitchen saccharomyces boulardii (FLORASTOR) 250 MG capsule Take one tablet BID x 1 month 60 capsule 0  . timolol (TIMOPTIC) 0.5 % ophthalmic solution Place 1 drop into both eyes every morning.  3  . Vitamins A & D (VITAMIN A & D) ointment Apply 1 application topically as needed (hemmroids).     No current  facility-administered medications on file prior to visit.    Allergies  Allergen Reactions  . Sulfites Anaphylaxis  . Amlodipine Other (See Comments)    Abdominal pain  . Iodine     Was told to place this as an allergy along with sulfites as she had almost anaphylactic reaction to shellfish years ago.  Clancy Gourd [Nitrofurantoin Macrocrystal] Nausea Only and Other (See Comments)    Headache  . Serotonin Reuptake Inhibitors (Ssris) Other (See Comments)  . Shellfish Allergy   . Venlafaxine Other (See Comments)  . Almond Oil Itching and Rash    ALL TREE NUTS  . Bentyl [Dicyclomine Hcl] Anxiety    Patient noticed anxiety with this medication.    Blood pressure 124/78, pulse 88, weight 189 lb 3.2 oz (85.8 kg), last menstrual period 07/06/2010, SpO2 97 %.  Essential (primary) hypertension Blood pressure well controlled during OV. Noted patient get very anxious when taking BO and unsure if her BP home cuff is accurate.   Per office assessment her home BP cuff is accurate within 77mmHg from manual readings. Patient took 1/4 tablet for alprazolam prior to this visit , but no BP medication. She is scheduled to start PREP program in February and already working on [employer].   Will continue therapy without changes. Recommend to visit neighborhood [employer] 1-2 time per month to check BP if needed. Plan to follow up with HTN clinic as needed, and with Dr Oval Linsey in April as previously scheduled.     Marsela Kuan Rodriguez-Guzman PharmD, BCPS, CPP [place] [id number] 07/29/2020 12:46 PM

## 2020-07-25 NOTE — Patient Instructions (Signed)
Return for a follow up appointment in AS NEEDED  Check your blood pressure at home daily (if able) and keep record of the readings.  Take your BP meds as follows: *NO CHANGE*  Bring all of your meds, your BP cuff and your record of home blood pressures to your next appointment.  Exercise as you're able, try to walk approximately 30 minutes per day.  Keep salt intake to a minimum, especially watch canned and prepared boxed foods.  Eat more fresh fruits and vegetables and fewer canned items.  Avoid eating in fast food restaurants.    HOW TO TAKE YOUR BLOOD PRESSURE: Rest 5 minutes before taking your blood pressure.  Don't smoke or drink caffeinated beverages for at least 30 minutes before. Take your blood pressure before (not after) you eat. Sit comfortably with your back supported and both feet on the floor (don't cross your legs). Elevate your arm to heart level on a table or a desk. Use the proper sized cuff. It should fit smoothly and snugly around your bare upper arm. There should be enough room to slip a fingertip under the cuff. The bottom edge of the cuff should be 1 inch above the crease of the elbow. Ideally, take 3 measurements at one sitting and record the average.   

## 2020-07-26 DIAGNOSIS — E039 Hypothyroidism, unspecified: Secondary | ICD-10-CM | POA: Diagnosis not present

## 2020-07-26 DIAGNOSIS — I10 Essential (primary) hypertension: Secondary | ICD-10-CM | POA: Diagnosis not present

## 2020-07-26 DIAGNOSIS — E7800 Pure hypercholesterolemia, unspecified: Secondary | ICD-10-CM | POA: Diagnosis not present

## 2020-07-29 NOTE — Assessment & Plan Note (Signed)
Blood pressure well controlled during OV. Noted patient get very anxious when taking BO and unsure if her BP home cuff is accurate.   Per office assessment her home BP cuff is accurate within 22mmHg from manual readings. Patient took 1/4 tablet for alprazolam prior to this visit , but no BP medication. She is scheduled to start PREP program in February and already working on [employer].   Will continue therapy without changes. Recommend to visit neighborhood [employer] 1-2 time per month to check BP if needed. Plan to follow up with HTN clinic as needed, and with Dr Oval Linsey in April as previously scheduled.

## 2020-08-02 ENCOUNTER — Ambulatory Visit: Payer: Medicare Other | Admitting: Obstetrics & Gynecology

## 2020-08-20 NOTE — Progress Notes (Signed)
[facility] PREP Weekly Session   Patient Details  Name: Jodi [name] MRN: [id number] Date of Birth: 1951/04/03 Age: 71 y.o. PCP: Carol Ada, MD  There were no vitals filed for this visit.   [facility] Weekly seesion - 08/20/20 1500      Weekly Session   Topic Discussed Importance of resistance training;Other ways to be active    Minutes exercised this week 195 minutes    Classes attended to date 3          Weekly self reports reviewed    Barnett Hatter 08/20/2020, 3:04 PM

## 2020-08-27 NOTE — Progress Notes (Signed)
[facility] PREP Weekly Session   Patient Details  Name: Jodi [name] MRN: [id number] Date of Birth: 14-Nov-1950 Age: 70 y.o. PCP: Carol Ada, MD  Vitals:   08/27/20 1440  Weight: 187 lb (84.8 kg)     [facility] Weekly seesion - 08/27/20 1400      Weekly Session   Topic Discussed Healthy eating tips    Minutes exercised this week 140 minutes    Classes attended to date 5           Blood pressures checked before class 134/84 and 136/90 Not currently checking or recording at home Action: take BP daily, record it. Reminded how to take BP (same time of day, bladder empty, no exercise, sitting) . Bring in log every week for review. Asked to comment on  when BP readings high.   Barnett Hatter 08/27/2020, 2:41 PM

## 2020-08-28 ENCOUNTER — Telehealth: Payer: Self-pay

## 2020-08-28 ENCOUNTER — Telehealth: Payer: Self-pay | Admitting: Cardiovascular Disease

## 2020-08-28 NOTE — Telephone Encounter (Signed)
Discussed with Raquel Pharm D and she recommends patient not taking her blood pressure at home secondary to anxiety Advised patient, verbalized understanding

## 2020-08-28 NOTE — Telephone Encounter (Signed)
Call from pt. Will miss tomorrow's class due to previously scheduled event.  Mentioned did check BP today and the last one 184/100 but that was a repeated one.  Initial BP 164/84 [facility] 130's/80-90's Not on meds. Encouraged to message Dr Oval Linsey. Will continue to help decrease anxiety about taking BP and increasing exercise.  We can make up the strength training scheduled for tomorrow.

## 2020-08-28 NOTE — Telephone Encounter (Signed)
Pt c/o BP issue: STAT if pt c/o blurred vision, one-sided weakness or slurred speech  1. What are your last 5 BP readings?  08/28/20 160/94 168/94 172/88 180/99 all taking in a matter of 30 mins  08/27/20 134/84  2. Are you having any other symptoms (ex. Dizziness, headache, blurred vision, passed out)? No   3. What is your BP issue? Is having hypertension she believes is anxiety related, but wanted to report to be on the safe side. Jodi [name] is not currently taking BP medication, but is taking the class Dr. Oval Linsey advised her to at the [facility].

## 2020-09-06 ENCOUNTER — Other Ambulatory Visit: Payer: Self-pay

## 2020-09-06 ENCOUNTER — Ambulatory Visit (INDEPENDENT_AMBULATORY_CARE_PROVIDER_SITE_OTHER): Payer: Medicare Other | Admitting: Pharmacist Clinician (PhC)/ Clinical Pharmacy Specialist

## 2020-09-06 DIAGNOSIS — I10 Essential (primary) hypertension: Secondary | ICD-10-CM | POA: Diagnosis not present

## 2020-09-06 NOTE — Patient Instructions (Signed)
Return for a a follow up appointment with Dr. Oval Linsey on April 5   Check your blood pressure at home daily (if able) and keep record of the readings.  Take your BP meds as follows:  Continue with all current medications  Bring all of your meds, your BP cuff and your record of home blood pressures to your next appointment.  Exercise as you're able, try to walk approximately 30 minutes per day.  Keep salt intake to a minimum, especially watch canned and prepared boxed foods.  Eat more fresh fruits and vegetables and fewer canned items.  Avoid eating in fast food restaurants.    HOW TO TAKE YOUR BLOOD PRESSURE: . Rest 5 minutes before taking your blood pressure. .  Don't smoke or drink caffeinated beverages for at least 30 minutes before. . Take your blood pressure before (not after) you eat. . Sit comfortably with your back supported and both feet on the floor (don't cross your legs). . Elevate your arm to heart level on a table or a desk. . Use the proper sized cuff. It should fit smoothly and snugly around your bare upper arm. There should be enough room to slip a fingertip under the cuff. The bottom edge of the cuff should be 1 inch above the crease of the elbow. . Ideally, take 3 measurements at one sitting and record the average.

## 2020-09-06 NOTE — Progress Notes (Signed)
Patient ID: Jodi [name]                 DOB: [birth year]                      MRN: [id number]     HPI:  Jodi [name] is a 70 y.o. female referred by Dr. Oval Linsey to HTN clinic. PMH includes issues with low potassium, anxiety, fatty liver, glaucoma, hypertension, hyperthyroidism, IBS, and sleep apnea. Patient was referred to PREP program start Feb/8th, and recommended to initiate DASH diet.  At her first visit in the PharmD clinic her pressure was good at 124/78.  She was only on carvedilol prn palpitations/elevated BP.  She also admitted to some anxiety associated with checking her pressure, which was probably the cause of some high home readings.  She wanted to focus on lifestyle modifications for better control, and no medication changes were made.    She returns today for follow up.  She has only taken 3 doses of carvedilol since her last appointment, and prefers not to take any medication unless absolutely necessary.  Has been working with the [place] plan and maintaining a low sodium diet as well.  In checking home readings, she did not make any notations about her heart rate until the last few days, when her meter apparently flagged her for low heart rate.  The readings she notes show HR between 50-68.  Current HTN meds:  Carvedilol 12.5mg  as needed for palpitation  Previously tried:  HCTZ - low electrolytes and syncopal episode Amlodipine - IBS Carvedilol makes her sleepy  BP goal: <130/80  Family History: The patient's family history includes Breast cancer in her cousin; Colon polyps in her brother and father; Dementia in her brother, father, and mother; Diabetes in her brother, father, and paternal grandmother; Heart disease in her maternal uncle; Hyperlipidemia in her father; Hypertension in her father and mother; Parkinson's disease in her mother; Prostate cancer in her father; Seizures in her brother.   Social History: The patient  reports that she quit smoking about 41 years  ago. She has never used smokeless tobacco. She reports previous alcohol use. She reports that she does not use drugs.   Diet: eating fruits and vegetables. 3 days very serious about DASH diet; increased fruit in diet; always eating veggies; not a big meat eater - more chicken and fish (likes catfish and salmon most); some beans to help with protein;  Easier to stop salt than sugar;   Exercise: 45 minutes walk most days; gardens flowers and veggies; PREP  Home BP readings:  24 morning readings average 138/68    12 evening readings average 143/66 *[facility] arm cuff - accurate    Wt Readings from Last 3 Encounters:  08/27/20 187 lb (84.8 kg)  07/25/20 189 lb 3.2 oz (85.8 kg)  07/15/20 189 lb (85.7 kg)   BP Readings from Last 3 Encounters:  09/06/20 (!) 156/88  07/25/20 124/78  07/15/20 (!) 146/90   Pulse Readings from Last 3 Encounters:  09/06/20 82  07/25/20 88  07/15/20 94    Past Medical History:  Diagnosis Date  . Abnormal Pap smear of cervix   . Anxiety   . Aortic atherosclerosis ([place])   . Diverticulitis   . Fatty liver disease, nonalcoholic   . Glaucoma   . H/O dizziness   . Hemorrhoids   . History of depression    incest survivor/dysthymia  . Hypertension   . Hyperthyroidism   .  Hypokalemia 04/29/2020  . IBS (irritable bowel syndrome)    lactose intolerance  . Palpitations 04/29/2020  . Restless leg syndrome   . Sleep apnea   . Snoring 11/17/2012  . Thyroid disease    hypothyroid  . Varicose veins     Current Outpatient Medications on File Prior to Visit  Medication Sig Dispense Refill  . ALPRAZolam (XANAX) 0.25 MG tablet Take 0.125 mg by mouth every 6 (six) hours as needed for anxiety.    . bismuth subsalicylate (PEPTO BISMOL) 262 MG chewable tablet Chew 524 mg by mouth as needed for diarrhea or loose stools.    Marland Kitchen EPINEPHrine 0.3 mg/0.3 mL IJ SOAJ injection Inject 0.3 mg into the muscle as needed for anaphylaxis.   0  . fluticasone (FLONASE) 50 MCG/ACT  nasal spray Place 2 sprays into both nostrils daily as needed for allergies.     Marland Kitchen lactase (LACTAID) 3000 units tablet Take 3,000 Units by mouth as needed (lactaid).     Marland Kitchen levothyroxine (SYNTHROID) 112 MCG tablet Take 112 mcg by mouth daily.    . Magnesium 200 MG TABS Take 200 mg by mouth daily.    . Multiple Vitamin (MULTIVITAMIN WITH MINERALS) TABS Take 1 tablet by mouth daily.    Marland Kitchen saccharomyces boulardii (FLORASTOR) 250 MG capsule Take one tablet BID x 1 month 60 capsule 0  . timolol (TIMOPTIC) 0.5 % ophthalmic solution Place 1 drop into both eyes every morning.  3  . albuterol (VENTOLIN HFA) 108 (90 Base) MCG/ACT inhaler 2 puffs as needed (Patient not taking: Reported on 09/06/2020)    . carvedilol (COREG) 12.5 MG tablet Take 12.5 mg by mouth as needed. TAKE 1/2 TABLET AS NEEDED FOR PALPITATIONS/ELEVATED BLOOD PRESSURE (Patient not taking: Reported on 09/06/2020)     No current facility-administered medications on file prior to visit.    Allergies  Allergen Reactions  . Sulfites Anaphylaxis  . Amlodipine Other (See Comments)    Abdominal pain  . Iodine     Was told to place this as an allergy along with sulfites as she had almost anaphylactic reaction to shellfish years ago.  Clancy Gourd [Nitrofurantoin Macrocrystal] Nausea Only and Other (See Comments)    Headache  . Serotonin Reuptake Inhibitors (Ssris) Other (See Comments)  . Shellfish Allergy   . Venlafaxine Other (See Comments)  . Almond Oil Itching and Rash    ALL TREE NUTS  . Bentyl [Dicyclomine Hcl] Anxiety    Patient noticed anxiety with this medication.    Blood pressure (!) 156/88, pulse 82, last menstrual period 07/06/2010.  Essential (primary) hypertension Patient with essential hypertension, with better readings at home than in the office.  Although her pressure is elevated in the office today, she does not wish to take a daily medication, but would prefer to continue with lifestyle modifications.  Could not ask her  to do daily carvedilol at this time, as we don't have a good idea of what her home heart rates have been running, or if they may be going too low.  Asked that she continue with regular home BP monitoring and include HR in her log sheet.  She will see Dr. Oval Linsey next month for follow up.    Tommy Medal PharmD CPP [facility] [address] [place] [address] 09/09/2020 1:21 PM

## 2020-09-09 ENCOUNTER — Ambulatory Visit (HOSPITAL_BASED_OUTPATIENT_CLINIC_OR_DEPARTMENT_OTHER): Payer: Medicare Other | Admitting: Obstetrics & Gynecology

## 2020-09-09 ENCOUNTER — Telehealth: Payer: Self-pay | Admitting: Cardiovascular Disease

## 2020-09-09 ENCOUNTER — Encounter (HOSPITAL_BASED_OUTPATIENT_CLINIC_OR_DEPARTMENT_OTHER): Payer: Self-pay | Admitting: Obstetrics & Gynecology

## 2020-09-09 ENCOUNTER — Other Ambulatory Visit: Payer: Self-pay

## 2020-09-09 VITALS — BP 185/94 | HR 97 | Ht 65.25 in | Wt 187.0 lb

## 2020-09-09 DIAGNOSIS — N952 Postmenopausal atrophic vaginitis: Secondary | ICD-10-CM

## 2020-09-09 MED ORDER — NONFORMULARY OR COMPOUNDED ITEM: 3 refills | Status: DC

## 2020-09-09 NOTE — Telephone Encounter (Signed)
° ° °  Pt c/o BP issue: STAT if pt c/o blurred vision, one-sided weakness or slurred speech  1. What are your last 5 BP readings? 184/94, 156/88  2. Are you having any other symptoms (ex. Dizziness, headache, blurred vision, passed out)? none  3. What is your BP issue? Pt said she went to her doctor today when she arrived they took her BP and it was 184/94 and before she left the office they took her BP and it was 156/88. She said doesn't have any symptoms but would like for Dr. Oval Linsey to know, and if Rip Harbour can call her back

## 2020-09-09 NOTE — Telephone Encounter (Signed)
Spoke with patient regarding blood pressure She was inquiring about a 24 hour blood pressure monitoring system She does not check her blood pressure at home secondary to the anxiety associated with it  Blood pressure upon arrival at appointment was 184/94, after visit down to 156/88 Will forward to Pharm D for review

## 2020-09-09 NOTE — Assessment & Plan Note (Signed)
Patient with essential hypertension, with better readings at home than in the office.  Although her pressure is elevated in the office today, she does not wish to take a daily medication, but would prefer to continue with lifestyle modifications.  Could not ask her to do daily carvedilol at this time, as we don't have a good idea of what her home heart rates have been running, or if they may be going too low.  Asked that she continue with regular home BP monitoring and include HR in her log sheet.  She will see Dr. Oval Linsey next month for follow up.

## 2020-09-09 NOTE — Progress Notes (Signed)
GYNECOLOGY  VISIT  CC:   Follow up after starting Vit E vaginal cream  HPI: 70 y.o. G1P1 Married White or Caucasian female here for follow up after starting vit E vaginal cream.  At first, she states she felt like the tissue was a little firm feeling but she is using this regularly.  Now, she feels like the tissue is much better.  She is satisfied with how this working.  She is not SA.  Denies vaginal bleeding.  Last pap 05/2020 neg with neg HR HPV.  She does needs a refill on the Vit E cream.    She is now taking a class at [facility] for nutrition and exercise.  Winifred Olive, RN, runs the program.  She started this about a month ago.    Pt is aware BP is elevated today.  She does have a blood pressure cuff at home.  Is off carvedilol at this time due to program at the [facility]  Patient Active Problem List   Diagnosis Date Noted  . Palpitations 04/29/2020  . Hypokalemia 04/29/2020  . Spider veins 11/23/2019  . Edema of both legs 11/23/2019  . 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 Anxiety disorder 01/04/2015  . Essential (primary) hypertension 01/04/2015  . Hypothyroidism 01/04/2015  . Pure hypercholesterolemia 01/04/2015  . Right upper quadrant pain 01/01/2015  . Vertigo, labyrinthine 11/17/2012  . Snoring 11/17/2012    Past Medical History:  Diagnosis Date  . Abnormal Pap smear of cervix   . Anxiety   . Aortic atherosclerosis ([place])   . Diverticulitis   . Fatty liver disease, nonalcoholic   . Glaucoma   . H/O dizziness   . Hemorrhoids   . History of depression    incest survivor/dysthymia  . Hypertension   . Hyperthyroidism   . Hypokalemia 04/29/2020  . IBS (irritable bowel syndrome)    lactose intolerance  . Palpitations 04/29/2020  . Restless leg syndrome   . Sleep apnea   . Snoring 11/17/2012  . Thyroid disease    hypothyroid  . Varicose veins     Past Surgical History:  Procedure Laterality Date  .  CHOLECYSTECTOMY  07/19/2017   Dr. Donne Hazel  . COLPOSCOPY  99, 00, 02, 04, 06  . ENDOVENOUS ABLATION SAPHENOUS VEIN W/ LASER Right 04-08-2015   endovenous laser ablation (right greater saphenous vein) by Victorino Dike MD    . LEEP  3/09   CIN II/III    MEDS:   Current Outpatient Medications on File Prior to Visit  Medication Sig Dispense Refill  . albuterol (VENTOLIN HFA) 108 (90 Base) MCG/ACT inhaler     . ALPRAZolam (XANAX) 0.25 MG tablet Take 0.125 mg by mouth every 6 (six) hours as needed for anxiety.    . bismuth subsalicylate (PEPTO BISMOL) 262 MG chewable tablet Chew 524 mg by mouth as needed for diarrhea or loose stools.    . carvedilol (COREG) 12.5 MG tablet Take 12.5 mg by mouth as needed. TAKE 1/2 TABLET AS NEEDED FOR PALPITATIONS/ELEVATED BLOOD PRESSURE    . EPINEPHrine 0.3 mg/0.3 mL IJ SOAJ injection Inject 0.3 mg into the muscle as needed for anaphylaxis.   0  . fluticasone (FLONASE) 50 MCG/ACT nasal spray Place 2 sprays into both nostrils daily as needed for allergies.     Marland Kitchen lactase (LACTAID) 3000 units tablet Take 3,000 Units by mouth as needed (lactaid).     Marland Kitchen levothyroxine (SYNTHROID) 112 MCG tablet Take 112 mcg  by mouth daily.    . Magnesium 200 MG TABS Take 200 mg by mouth daily.    . Multiple Vitamin (MULTIVITAMIN WITH MINERALS) TABS Take 1 tablet by mouth daily.    Marland Kitchen saccharomyces boulardii (FLORASTOR) 250 MG capsule Take one tablet BID x 1 month 60 capsule 0  . timolol (TIMOPTIC) 0.5 % ophthalmic solution Place 1 drop into both eyes every morning.  3   No current facility-administered medications on file prior to visit.    ALLERGIES: Sulfites, Amlodipine, Iodine, Macrodantin [nitrofurantoin macrocrystal], Serotonin reuptake inhibitors (ssris), Shellfish allergy, Venlafaxine, Almond oil, and Bentyl [dicyclomine hcl]  Family History  Problem Relation Age of Onset  . Hypertension Father   . Diabetes Father   . Dementia Father   . Hyperlipidemia Father   . Prostate  cancer Father   . Colon polyps Father   . Parkinson's disease Mother   . Dementia Mother   . Hypertension Mother   . Breast cancer Cousin        maternal side of family  . Dementia Brother   . Colon polyps Brother   . Diabetes Brother   . Seizures Brother   . Heart disease Maternal Uncle   . Diabetes Paternal Grandmother   . Colon cancer Neg Hx   . Esophageal cancer Neg Hx   . Stomach cancer Neg Hx   . Liver disease Neg Hx   . Pancreatic cancer Neg Hx     SH:  Married, non smoker  Review of Systems  Constitutional: Negative.   Genitourinary: Negative.     PHYSICAL EXAMINATION:    BP (!) 185/94   Pulse 97   Ht 5' 5.25" (1.657 m)   Wt 187 lb (84.8 kg)   LMP 07/06/2010   BMI 30.88 kg/m     General appearance: alert, cooperative and appears stated age No physical exam performed today  Assessment/Plan: 1. Vaginal atrophy - NONFORMULARY OR COMPOUNDED ITEM; Vitamin E vaginal cream 200u/ml.  Apply 12ml vaginally and small amount externally two to three times weekly.  Dispense: 36 each; Refill: 3

## 2020-09-10 NOTE — Telephone Encounter (Signed)
We could consider a 24 hr cuff, but I suspect it would just cause her to be anxious for a longer period of time.  She should probably talk to her PCP about the anxiety and determine if she needs daily medication to prevent it from occurring as opposed to just taking 1/4 to 1/2 alprazolam once the anxiety occurs.  Any other thoughts Dr. Einar Crow?

## 2020-09-10 NOTE — Progress Notes (Signed)
[facility] [employer] PREP Weekly Session   Patient Details  Name: Jodi [name] MRN: [id number] Date of Birth: Sep 13, 1950 Age: 70 y.o. PCP: Carol Ada, MD  Vitals:   09/10/20 1300  Weight: 185 lb 6.4 oz (84.1 kg)     [facility] [employer] Weekly seesion - 09/10/20 1400      Weekly Session   Topic Discussed Restaurant Eating    Minutes exercised this week 105 minutes    Classes attended to date [name] 09/10/2020, 2:36 PM

## 2020-09-10 NOTE — Telephone Encounter (Signed)
I suspect that Jodi [name] is right and the monitor may make her anxious.  Now that she isn't on any BP meds regularly her BP may be running high.  I recommend trying diltiazem 120mg  daily which should help both her BP and palpitations.  I'm OK with her wearing an ambulatory BP monitor if we can figure out how to order it. In an ideal world she should really be on an SSRI for anxiety as well but I know she struggles with intolerance to medications.

## 2020-09-11 ENCOUNTER — Encounter (HOSPITAL_BASED_OUTPATIENT_CLINIC_OR_DEPARTMENT_OTHER): Payer: Self-pay

## 2020-09-11 NOTE — Telephone Encounter (Signed)
Left message to call back  

## 2020-09-17 NOTE — Progress Notes (Signed)
[facility] PREP Weekly Session   Patient Details  Name: Jodi [name] MRN: [id number] Date of Birth: [birth year] Age: 70 y.o. PCP: Carol Ada, MD  Vitals:   09/17/20 1445  Weight: 185 lb 9.6 oz (84.2 kg)     [facility] [employer] Weekly seesion - 09/17/20 1400      Weekly Session   Topic Discussed Stress management and problem solving    Minutes exercised this week 225 minutes    Classes attended to date [place] 09/17/2020, 2:46 PM

## 2020-09-23 NOTE — Telephone Encounter (Signed)
Spoke with patient and she would like to just discuss with Dr Oval Linsey at follow up 4/5

## 2020-09-24 NOTE — Progress Notes (Signed)
[facility] PREP Weekly Session   Patient Details  Name: Jodi [name] MRN: [id number] Date of Birth: [birth year] Age: 70 y.o. PCP: Carol Ada, MD  Vitals:   09/24/20 1330  Weight: 185 lb 12.8 oz (84.3 kg)     [facility] [employer] Weekly seesion - 09/24/20 1400      Weekly Session   Topic Discussed Expectations and non-scale victories    Minutes exercised this week 145 minutes    Classes attended to date [place] 09/24/2020, 2:51 PM

## 2020-09-30 ENCOUNTER — Encounter: Payer: Self-pay | Admitting: Obstetrics & Gynecology

## 2020-09-30 DIAGNOSIS — Z1231 Encounter for screening mammogram for malignant neoplasm of breast: Secondary | ICD-10-CM | POA: Diagnosis not present

## 2020-10-01 NOTE — Progress Notes (Signed)
[facility] PREP Weekly Session   Patient Details  Name: Jodi [name] MRN: [id number] Date of Birth: 09/09/1950 Age: 70 y.o. PCP: Carol Ada, MD  Vitals:   10/01/20 1414  Weight: 184 lb 12.8 oz (83.8 kg)     [facility] [employer] Weekly seesion - 10/01/20 1400      Weekly Session   Topic Discussed Other   Portion control   Minutes exercised this week 215 minutes    Classes attended to date [place] 10/01/2020, 2:15 PM

## 2020-10-03 DIAGNOSIS — I10 Essential (primary) hypertension: Secondary | ICD-10-CM | POA: Diagnosis not present

## 2020-10-03 DIAGNOSIS — E7800 Pure hypercholesterolemia, unspecified: Secondary | ICD-10-CM | POA: Diagnosis not present

## 2020-10-03 DIAGNOSIS — E039 Hypothyroidism, unspecified: Secondary | ICD-10-CM | POA: Diagnosis not present

## 2020-10-07 ENCOUNTER — Ambulatory Visit (HOSPITAL_BASED_OUTPATIENT_CLINIC_OR_DEPARTMENT_OTHER): Payer: Medicare Other | Admitting: Obstetrics & Gynecology

## 2020-10-07 ENCOUNTER — Encounter (HOSPITAL_BASED_OUTPATIENT_CLINIC_OR_DEPARTMENT_OTHER): Payer: Self-pay

## 2020-10-08 ENCOUNTER — Other Ambulatory Visit: Payer: Self-pay

## 2020-10-08 ENCOUNTER — Encounter: Payer: Self-pay | Admitting: Cardiovascular Disease

## 2020-10-08 ENCOUNTER — Ambulatory Visit: Payer: Medicare Other | Admitting: Cardiovascular Disease

## 2020-10-08 VITALS — BP 164/88 | HR 87 | Ht 65.5 in | Wt 188.0 lb

## 2020-10-08 DIAGNOSIS — E876 Hypokalemia: Secondary | ICD-10-CM

## 2020-10-08 DIAGNOSIS — R600 Localized edema: Secondary | ICD-10-CM | POA: Diagnosis not present

## 2020-10-08 DIAGNOSIS — R0789 Other chest pain: Secondary | ICD-10-CM | POA: Diagnosis not present

## 2020-10-08 DIAGNOSIS — R002 Palpitations: Secondary | ICD-10-CM | POA: Diagnosis not present

## 2020-10-08 DIAGNOSIS — I10 Essential (primary) hypertension: Secondary | ICD-10-CM

## 2020-10-08 DIAGNOSIS — F419 Anxiety disorder, unspecified: Secondary | ICD-10-CM

## 2020-10-08 HISTORY — DX: Other chest pain: R07.89

## 2020-10-08 NOTE — Patient Instructions (Signed)
Medication Instructions:  Your physician recommends that you continue on your current medications as directed. Please refer to the Current Medication list given to you today.   *If you need a refill on your cardiac medications before your next appointment, please call your pharmacy*  Lab Work: NONE   Testing/Procedures: WILL BE IN TOUCH WITH YOU ABOUT THE HOME MONITORING MACHINE   Follow-Up: At [facility], you and your health needs are our priority.  As part of our continuing mission to provide you with exceptional heart care, we have created designated Provider Care Teams.  These Care Teams include your primary Cardiologist (physician) and Advanced Practice Providers (APPs -  Physician Assistants and Nurse Practitioners) who all work together to provide you with the care you need, when you need it.  We recommend signing up for the patient portal called "MyChart".  Sign up information is provided on this After Visit Summary.  MyChart is used to connect with patients for Virtual Visits (Telemedicine).  Patients are able to view lab/test results, encounter notes, upcoming appointments, etc.  Non-urgent messages can be sent to your provider as well.   To learn more about what you can do with MyChart, go to [email or web address].    Your next appointment:   4 month(s)  The format for your next appointment:   In Person  Provider:   DR [facility] OR NP/PA AT [place] physician recommends that you schedule a follow-up appointment in: 4-6 [place]

## 2020-10-08 NOTE — Assessment & Plan Note (Signed)
Resolved.  Consider spironolactone if needed in the future for blood pressure control.

## 2020-10-08 NOTE — Progress Notes (Signed)
Cardiology Office Note   Date:  10/08/2020   ID:  Jodi [name], DOB January 16, 1951, MRN [id number]  PCP:  Carol Ada, MD  Cardiologist:   Skeet Latch, MD   No chief complaint on file.   History of Present Illness: Jodi [name] is a 70 y.o. female with OSA not on CPAP, hypertension and anxiety here for follow up.  She was initially seen for the evaluation of palpitations and shortness of breath.  In early September she was talking to a neighbor and when she stood up she felt like her heart rate stopped and then she felt presyncopal but didn't pass out.  She reports four similar episodes.  She went to the ED each time.  The first time her potassium was noted to be low so she was given a potassium supplement.  Her potassium was 2.9.  She reports being on HCTZ for years.  She notes an occasional fluttering.  She also feels short of breath at times when it occurs.  She didn't feel it while wearing the monitor at the hospital.  She notes that her heart beats hard when she tries to exert herself.  She likes to walk a lot for exercise.  However now, even with carrying laundry up and down steps she is more short of breath than usual.  The [name] mostly improved after she started potassium.  She continues to have lightheadedness if she bent over.  She saw her PCP and her BP was elevated so she was started on olmesartan.  However she thinks that she has white coat syndrome.  She noted vertigo so she stopped taking it.  She notes that she worries a lot about her health and gets anxious thinking about it.  She hears her heat when trying to sleep.  It seems to be regular.  She has no edema, orthopnea or PND.    Jodi [name] and palpitations.  Her potassium was also low.  She we switched from HCTZ to amlodipine.  She stopped amlodipine because it irritated her IBS.  She tried 1 dose of carvedilol and stopped it because it made her very sleepy.  She had an ETT 04/2020 that was  negative.  She achieved 7 METs on a Bruce protocol.  She also wore an ambulatory monitor that revealed PACs and sinus tachcyardia that corresponded to episodes of heart fluttering.  It was recommeded that she stat carvedilol but felt fatigued.  She has OSA and has been off her CPAP machine for at least three years. She had a repeat sleep study and was negative for PE. She was referred to PREP program through the [facility]. [name] was thought to be related and she has been taking Carvedilol as needed. She states that she is tolerating the Carvedilol, is able to have "deep sleeps".   She has been doing well since the last visit but she does have occasional chest pain centrally that radiates to her lower back. When her back pain starts she has referred abdominal pain. However she relates this to her history of anxiety. She states that it starts when she is resting, not upon exertion(walking up hill). She has no lightheadedness or dizziness. Palpating the area does not exaerbate her  [name] She has been attending group exercise classes that has improved her anxiety, GERD, and palpitations. She has not been taking her at home blood pressures. She has no lower extremity edema, orthopnea, or PND. She reports that after  taking the [name] it has improved her lower extremity swelling but this could be her participating in group fitness classes. She has not been salting her foods.   Past Medical History:  Diagnosis Date  . Abnormal Pap smear of cervix   . Anxiety   . Aortic atherosclerosis ([place])   . Atypical chest pain 10/08/2020   [name].  She feels good with exercise and notes that in general she is feeling better since she started the exercise program.  She had a normal stress test in 2021.  No repeat ischemia testing advised.  She does also report difficulty and pain with swallowing with referred pain into her back.  She has GI follow-up.  . Diverticulitis   . Fatty liver  disease, nonalcoholic   . Glaucoma   . H/O dizziness   . Hemorrhoids   . History of depression    incest survivor/dysthymia  . Hypertension   . Hyperthyroidism   . Hypokalemia 04/29/2020  . IBS (irritable bowel syndrome)    lactose intolerance  . Palpitations 04/29/2020  . Restless leg syndrome   . Sleep apnea   . Snoring 11/17/2012  . Thyroid disease    hypothyroid  . Varicose veins     Past Surgical History:  Procedure Laterality Date  . CHOLECYSTECTOMY  07/19/2017   Dr. Donne Hazel  . COLPOSCOPY  99, 00, 02, 04, 06  . ENDOVENOUS ABLATION SAPHENOUS VEIN W/ LASER Right 04-08-2015   endovenous laser ablation (right greater saphenous vein) by Victorino Dike MD    . LEEP  3/09   CIN II/III     Current Outpatient Medications  Medication Sig Dispense Refill  . albuterol (VENTOLIN HFA) 108 (90 Base) MCG/ACT inhaler     . ALPRAZolam (XANAX) 0.25 MG tablet Take 0.125 mg by mouth every 6 (six) hours as needed for anxiety.    . bismuth subsalicylate (PEPTO BISMOL) 262 MG chewable tablet Chew 524 mg by mouth as needed for diarrhea or loose stools.    Marland Kitchen EPINEPHrine 0.3 mg/0.3 mL IJ SOAJ injection Inject 0.3 mg into the muscle as needed for anaphylaxis.   0  . fluticasone (FLONASE) 50 MCG/ACT nasal spray Place 2 sprays into both nostrils daily as needed for allergies.     Marland Kitchen lactase (LACTAID) 3000 units tablet Take 3,000 Units by mouth as needed (lactaid).     Marland Kitchen levothyroxine (SYNTHROID) 112 MCG tablet Take 112 mcg by mouth daily.    . Magnesium 200 MG TABS Take 200 mg by mouth daily.    . Multiple Vitamin (MULTIVITAMIN WITH MINERALS) TABS Take 1 tablet by mouth daily.    . NONFORMULARY OR COMPOUNDED ITEM Vitamin E vaginal cream 200u/ml.  Apply 49ml vaginally and small amount externally two to three times weekly. 36 each 3  . saccharomyces boulardii (FLORASTOR) 250 MG capsule Take one tablet BID x 1 month 60 capsule 0  . timolol (TIMOPTIC) 0.5 % ophthalmic solution Place 1 drop into both eyes  every morning.  3  . carvedilol (COREG) 12.5 MG tablet Take 12.5 mg by mouth as needed. TAKE 1/2 TABLET AS NEEDED FOR PALPITATIONS/ELEVATED BLOOD PRESSURE (Patient not taking: Reported on 10/08/2020)     No current facility-administered medications for this visit.    Allergies:   Sulfites, Amlodipine, Iodine, Macrodantin [nitrofurantoin macrocrystal], Serotonin reuptake inhibitors (ssris), Shellfish allergy, Venlafaxine, Almond oil, and Bentyl [dicyclomine hcl]    Social History:  The patient  reports that she quit smoking about 42 years ago. She  has never used smokeless tobacco. She reports previous alcohol use. She reports that she does not use drugs.   Family History:  The patient's family history includes Breast cancer in her cousin; Colon polyps in her brother and father; Dementia in her brother, father, and mother; Diabetes in her brother, father, and paternal grandmother; Heart disease in her maternal uncle; Hyperlipidemia in her father; Hypertension in her father and mother; Parkinson's disease in her mother; Prostate cancer in her father; Seizures in her brother.    ROS:  Please see the history of present illness.   Otherwise, review of systems are positive for chest pain, back pain, and abdominal pain.  All other systems are reviewed and negative.    PHYSICAL EXAM: VS:  BP (!) 164/88   Pulse 87   Ht 5' 5.5" (1.664 m)   Wt 188 lb (85.3 kg)   LMP 07/06/2010   SpO2 98%   BMI 30.81 kg/m  , BMI Body mass index is 30.81 kg/m. GENERAL:  Well appearing HEENT: Pupils equal round and reactive, fundi not visualized, oral mucosa unremarkable NECK:  No jugular venous distention, waveform within normal limits, carotid upstroke brisk and symmetric, no bruits LUNGS:  Clear to auscultation bilaterally HEART:  RRR.  PMI not displaced or sustained,S1 and S2 within normal limits, no S3, no S4, no clicks, no rubs, no murmurs ABD:  Flat, positive bowel sounds normal in frequency in pitch, no  bruits, no rebound, no guarding, no midline pulsatile mass, no hepatomegaly, no splenomegaly EXT:  2 plus pulses throughout, no edema, no cyanosis no clubbing SKIN:  No rashes no nodules NEURO:  Cranial nerves II through XII grossly intact, motor grossly intact throughout PSYCH:  Cognitively intact, oriented to person place and time   EKG:   10/08/20: EKG is not ordered today. 04/23/2020: Sinus rhythm.  Rate 96 bpm.  Low voltage precordial leads.  ETT 05/01/20:  Upsloping ST segment depression ST segment depression was noted during stress in the II, III, aVF, V6, V5, V4 and V3 leads.  Blood pressure demonstrated a normal response to exercise.  Negative, adequate stress test.  7 Day Event Monitor 05/2020:  Quality: Fair.  Baseline artifact. Predominant rhythm: sinus rhythm Average heart rate: 85 bpm Max heart rate: 140 bpm Min heart rate: 58 bpm Pauses >2.5 seconds: none Occasional PACs 8 beat run of atrial tachycardia  Patient reported heart fluttering. At these times, sinus rhythm, PACs and sinus tachycardia were noted.   Recent Labs: 04/10/2020: Magnesium 2.1; TSH 0.616 04/23/2020: ALT 27; B Natriuretic Peptide 21.5 04/27/2020: Hemoglobin 14.9; Platelets 175 05/15/2020: BUN 8; Creatinine, Ser 0.37; Potassium 4.0; Sodium 141    Lipid Panel No results found for: CHOL, TRIG, HDL, CHOLHDL, VLDL, LDLCALC, LDLDIRECT    Wt Readings from Last 3 Encounters:  10/08/20 188 lb (85.3 kg)  10/01/20 184 lb 12.8 oz (83.8 kg)  09/24/20 185 lb 12.8 oz (84.3 kg)      ASSESSMENT AND PLAN: Palpitations Improving with exercise.  Didn't tolerate beta blockers.  Consider diltiazem in the future if needed for blood pressure control.  Essential (primary) hypertension Blood pressure is very elevated but has been very labile.  It goes up in doctor's offices and also whenever she checks it at home.  We will get a 7-day ambulatory blood pressure monitor to see what is doing in her home  environment.  I suspect it will be elevated very least the first couple days until she gets habituated to it.  She  has carvedilol to take as needed but has only used it once.  Edema of both legs Improving with exercise  Anxiety disorder She has very good insight and knows that she is anxious.  She also notes that this is contributing to her hypertension.  Fortunately she has not tolerated chronic anxiety medication.  Continue to work with PCP on this.  Hypokalemia Resolved.  Consider spironolactone if needed in the future for blood pressure control.   Current medicines are reviewed at length with the patient today.  The patient does not have concerns regarding medicines.  The following changes have been made: none   Labs/ tests ordered today include:  No orders of the defined types were placed in this encounter.  Time spent: 40 minutes-Greater than 50% of this time was spent in counseling, explanation of diagnosis, planning of further management, and coordination of care.   Disposition: FU with Massiah Longanecker C. Oval Linsey, MD, [facility] in 4 months.  4-6 weeks with PHarmD   I,Alexis Bryant,acting as a scribe for Skeet Latch, MD.,have documented all relevant documentation on the behalf of Skeet Latch, MD,as directed by  Skeet Latch, MD while in the presence of Skeet Latch, MD.  Signed, Trumann Oval Linsey, MD, [facility]  10/08/2020 1:09 PM    [facility]

## 2020-10-08 NOTE — Progress Notes (Signed)
[facility] [employer] PREP Weekly Session   Patient Details  Name: Jodi [name] MRN: [id number] Date of Birth: 1951/02/28 Age: 70 y.o. PCP: Carol Ada, MD  Vitals:   10/08/20 1300  Weight: 184 lb 8 oz (83.7 kg)     [facility] [employer] Weekly seesion - 10/08/20 1400      Weekly Session   Topic Discussed Finding support    Minutes exercised this week 80 minutes    Classes attended to date [place] 10/08/2020, 2:45 PM

## 2020-10-08 NOTE — Assessment & Plan Note (Addendum)
Improving with exercise.  Didn't tolerate beta blockers.  Consider diltiazem in the future if needed for blood pressure control.

## 2020-10-08 NOTE — Assessment & Plan Note (Signed)
She has very good insight and knows that she is anxious.  She also notes that this is contributing to her hypertension.  Fortunately she has not tolerated chronic anxiety medication.  Continue to work with PCP on this.

## 2020-10-08 NOTE — Assessment & Plan Note (Signed)
Improving with exercise

## 2020-10-08 NOTE — Addendum Note (Signed)
Addended by: Alvina Filbert B on: 10/08/2020 04:37 PM   Modules accepted: Orders

## 2020-10-08 NOTE — Assessment & Plan Note (Signed)
Blood pressure is very elevated but has been very labile.  It goes up in doctor's offices and also whenever she checks it at home.  We will get a 7-day ambulatory blood pressure monitor to see what is doing in her home environment.  I suspect it will be elevated very least the first couple days until she gets habituated to it.  She has carvedilol to take as needed but has only used it once.

## 2020-10-08 NOTE — Telephone Encounter (Signed)
Had discussed during telephone encounter. Will discuss further at visit today

## 2020-10-09 ENCOUNTER — Telehealth: Payer: Self-pay | Admitting: Internal Medicine

## 2020-10-09 NOTE — Telephone Encounter (Signed)
Pt to keep her OV as scheduled.

## 2020-10-09 NOTE — Telephone Encounter (Signed)
Patient called in requesting a call back to talk about the chest/back pain she is having until her appointment 10/24/20

## 2020-10-09 NOTE — Telephone Encounter (Signed)
Pt reports she has been having epigastric pain. She saw her cardiologist yesterday and was told it was not her heart and to call GI. Discussed with pt that she might want to try pepcid or prevacid OTC to see if that helps. Pt verbalized understanding.

## 2020-10-10 ENCOUNTER — Encounter: Payer: Self-pay | Admitting: Obstetrics & Gynecology

## 2020-10-10 DIAGNOSIS — M8589 Other specified disorders of bone density and structure, multiple sites: Secondary | ICD-10-CM | POA: Diagnosis not present

## 2020-10-10 DIAGNOSIS — Z803 Family history of malignant neoplasm of breast: Secondary | ICD-10-CM | POA: Diagnosis not present

## 2020-10-10 DIAGNOSIS — R928 Other abnormal and inconclusive findings on diagnostic imaging of breast: Secondary | ICD-10-CM | POA: Diagnosis not present

## 2020-10-10 DIAGNOSIS — Z780 Asymptomatic menopausal state: Secondary | ICD-10-CM | POA: Diagnosis not present

## 2020-10-10 DIAGNOSIS — R922 Inconclusive mammogram: Secondary | ICD-10-CM | POA: Diagnosis not present

## 2020-10-15 ENCOUNTER — Telehealth: Payer: Self-pay | Admitting: *Deleted

## 2020-10-15 NOTE — Progress Notes (Signed)
[facility] [employer] PREP Weekly Session   Patient Details  Name: Jodi [name] MRN: [id number] Date of Birth: 09-29-1950 Age: 70 y.o. PCP: Carol Ada, MD  Vitals:   10/15/20 1501  Weight: 182 lb (82.6 kg)     [facility] [employer] Weekly seesion - 10/15/20 1500      Weekly Session   Topic Discussed Calorie breakdown    Minutes exercised this week 160 minutes    Classes attended to date 15            [place] 10/15/2020, 3:03 PM

## 2020-10-15 NOTE — Telephone Encounter (Signed)
LMVM please call [phone], ask to speak with Darrick Penna or Valetta Fuller in monitor department to set up appointment for 24 hour ambulatory blood pressure monitor.  Available at the [facility] office, Thursday 10/17/20 or Monday 10/21/20.

## 2020-10-16 ENCOUNTER — Telehealth: Payer: Self-pay | Admitting: *Deleted

## 2020-10-16 NOTE — Telephone Encounter (Signed)
Scheduled for 24 hour ambulatory blood pressure monitor 10/21/2020, 8:30AM.

## 2020-10-16 NOTE — Telephone Encounter (Signed)
Follow Up:     Pt is returning a call from yesterday

## 2020-10-21 ENCOUNTER — Other Ambulatory Visit: Payer: Self-pay

## 2020-10-21 ENCOUNTER — Other Ambulatory Visit: Payer: Self-pay | Admitting: Cardiovascular Disease

## 2020-10-21 ENCOUNTER — Ambulatory Visit (INDEPENDENT_AMBULATORY_CARE_PROVIDER_SITE_OTHER): Payer: Medicare Other

## 2020-10-21 DIAGNOSIS — I10 Essential (primary) hypertension: Secondary | ICD-10-CM

## 2020-10-21 DIAGNOSIS — R030 Elevated blood-pressure reading, without diagnosis of hypertension: Secondary | ICD-10-CM | POA: Diagnosis not present

## 2020-10-22 NOTE — Progress Notes (Signed)
[facility] PREP Weekly Session   Patient Details  Name: Jodi [name] MRN: [id number] Date of Birth: 1951-04-03 Age: 70 y.o. PCP: Carol Ada, MD  Vitals:   10/22/20 1300  Weight: 184 lb (83.5 kg)     [facility] [employer] Weekly seesion - 10/22/20 1400      Weekly Session   Topic Discussed Hitting roadblocks   goals and activity plan for next 90 days.   Minutes exercised this week 170 minutes    Classes attended to date [place] 10/22/2020, 2:41 PM

## 2020-10-24 ENCOUNTER — Ambulatory Visit: Payer: Medicare Other | Admitting: Nurse Practitioner

## 2020-10-29 ENCOUNTER — Ambulatory Visit (HOSPITAL_BASED_OUTPATIENT_CLINIC_OR_DEPARTMENT_OTHER): Payer: Medicare Other | Admitting: Obstetrics & Gynecology

## 2020-10-29 NOTE — Progress Notes (Signed)
[facility] PREP Weekly Session   Patient Details  Name: Jodi [name] MRN: [id number] Date of Birth: 1951-05-03 Age: 70 y.o. PCP: Carol Ada, MD  Vitals:   10/29/20 1430  Weight: 182 lb (82.6 kg)     [facility] [employer] Weekly seesion - 10/29/20 1400      Weekly Session   Topic Discussed --   final fit test and survey   Minutes exercised this week 230 minutes    Classes attended to date [place] 10/29/2020, 2:32 PM

## 2020-10-31 DIAGNOSIS — E7800 Pure hypercholesterolemia, unspecified: Secondary | ICD-10-CM | POA: Diagnosis not present

## 2020-10-31 DIAGNOSIS — E039 Hypothyroidism, unspecified: Secondary | ICD-10-CM | POA: Diagnosis not present

## 2020-10-31 DIAGNOSIS — Z0000 Encounter for general adult medical examination without abnormal findings: Secondary | ICD-10-CM | POA: Diagnosis not present

## 2020-10-31 DIAGNOSIS — Z1389 Encounter for screening for other disorder: Secondary | ICD-10-CM | POA: Diagnosis not present

## 2020-10-31 DIAGNOSIS — I10 Essential (primary) hypertension: Secondary | ICD-10-CM | POA: Diagnosis not present

## 2020-10-31 NOTE — Progress Notes (Signed)
[facility]   Patient Details  Name: Jodi [name] MRN: [id number] Date of Birth: 1950-08-08 Age: 70 y.o. PCP: Carol Ada, MD  Vitals:   10/31/20 1505  BP: 132/78  Pulse: 94  SpO2: 96%  Weight: 187 lb (84.8 kg)  Height: 5' 5.5" (1.664 m)      [facility] Eval - 10/31/20 1500      Referral    Program Start Date --   PREP final date 10/31/20     Measurement   Waist Circumference 39 inches    Hip Circumference 43 inches    Body fat 30.6 percent      Information for Trainer   Goals --   Continue exercise, weight goal 175 lbs     Mobility and Daily Activities   I find it easy to walk up or down two or more flights of stairs. 4    I have no trouble taking out the trash. 4    I do housework such as vacuuming and dusting on my own without difficulty. 4    I can easily lift a gallon of milk (8lbs). 4    I can easily walk a mile. 4    I have no trouble reaching into high cupboards or reaching down to pick up something from the floor. 4    I do not have trouble doing out-door work such as Armed forces logistics/support/administrative officer, raking leaves, or gardening. 4      Mobility and Daily Activities   I feel younger than my age. 3    I feel independent. 4    I feel energetic. 3    I live an active life.  4    I feel strong. 3    I feel healthy. 3    I feel active as other people my age. 4      How fit and strong are you.   Fit and Strong Total Score 52          Past Medical History:  Diagnosis Date  . Abnormal Pap smear of cervix   . Anxiety   . Aortic atherosclerosis ([place])   . Atypical chest pain 10/08/2020   Symptoms are very atypical.  She feels good with exercise and notes that in general she is feeling better since she started the exercise program.  She had a normal stress test in 2021.  No repeat ischemia testing advised.  She does also report difficulty and pain with swallowing with referred pain into her back.  She has GI follow-up.  . Diverticulitis   . Fatty  liver disease, nonalcoholic   . Glaucoma   . H/O dizziness   . Hemorrhoids   . History of depression    incest survivor/dysthymia  . Hypertension   . Hyperthyroidism   . Hypokalemia 04/29/2020  . IBS (irritable bowel syndrome)    lactose intolerance  . Palpitations 04/29/2020  . Restless leg syndrome   . Sleep apnea   . Snoring 11/17/2012  . Thyroid disease    hypothyroid  . Varicose veins    Past Surgical History:  Procedure Laterality Date  . CHOLECYSTECTOMY  07/19/2017   Dr. Donne Hazel  . COLPOSCOPY  99, 00, 02, 04, 06  . ENDOVENOUS ABLATION SAPHENOUS VEIN W/ LASER Right 04-08-2015   endovenous laser ablation (right greater saphenous vein) by Victorino Dike MD    . LEEP  3/09   CIN II/III   Social History   Tobacco Use  Smoking  Status Former Smoker  . Quit date: 09/21/1978  . Years since quitting: 42.1  Smokeless Tobacco Never Used  Tobacco Comment   quit 30 years ago    Improvements in BP, and weight loss. Significant reduction in %body fat    Kara Pacer Ange [place] 10/31/2020, 3:08 PM

## 2020-11-05 ENCOUNTER — Other Ambulatory Visit: Payer: Self-pay

## 2020-11-05 ENCOUNTER — Ambulatory Visit (INDEPENDENT_AMBULATORY_CARE_PROVIDER_SITE_OTHER): Payer: Medicare Other | Admitting: Pharmacist Clinician (PhC)/ Clinical Pharmacy Specialist

## 2020-11-05 DIAGNOSIS — I10 Essential (primary) hypertension: Secondary | ICD-10-CM

## 2020-11-05 NOTE — Progress Notes (Signed)
Patient ID: Jodi [name]                 DOB: 27-Jun-1951                      MRN: [id number]     HPI:  Jodi [name] is a 70 y.o. female referred by Dr. Oval Linsey to HTN clinic. PMH includes issues with low potassium, anxiety, fatty liver, glaucoma, hypertension, hyperthyroidism, IBS, and sleep apnea. Patient was referred to PREP program start Feb/8th, and recommended to initiate DASH diet.  At her first visit in the PharmD clinic her pressure was good at 124/78.  She was only on carvedilol prn palpitations/elevated BP.  She also admitted to some anxiety associated with checking her pressure, which was probably the cause of some high home readings.  She wanted to focus on lifestyle modifications for better control, and no medication changes were made.   She has now been seen several times by our clinic and her pressure continues to be just above the 130/80 goal.  Since her last visit she wore a 24 hour monitor, which showed an average of 138/75.  It also indicated that she does "dip" during the night, about 12 points systolic.    She returns today for follow up.  She continues to only take carvedilol prn, despite having blood pressures > 130/80.  Patient feels that she is really doing just fine with current lifestyle modifications, and would prefer not to take any medications.  After finishing the PREP exercise program it was noted that her body fat went down from 42.3 to 30.6%.  She did not lose any weight, but notes that clothes fit better.  Now her focus is to continue with the exercise and work on weight loss.    Current HTN meds: none (carvedilol 12.5 mg prn palpitations)  Previously tried:  HCTZ - low electrolytes and syncopal episode Amlodipine - IBS Carvedilol makes her sleepy  BP goal: <130/80  Family History: The patient's family history includes Breast cancer in her cousin; Colon polyps in her brother and father; Dementia in her brother, father, and mother; Diabetes in her brother,  father, and paternal grandmother; Heart disease in her maternal uncle; Hyperlipidemia in her father; Hypertension in her father and mother; Parkinson's disease in her mother; Prostate cancer in her father; Seizures in her brother.   Social History: The patient  reports that she quit smoking about 41 years ago. She has never used smokeless tobacco. She reports previous alcohol use. She reports that she does not use drugs.   Diet: eating fruits and vegetables. 3 days very serious about DASH diet; increased fruit in diet; always eating veggies; not a big meat eater - more chicken and fish (likes catfish and salmon most); some beans to help with protein;  Easier to stop salt than sugar; has cut salt back  Exercise: 45 minutes walk most days; gardens flowers and veggies; PREP is now over, but did join Y, plan is to go conintually on Tu/Th, continues to walk at home, watches her daily soap opera with hand weights and squats  Home BP readings:  Wore 24 hour BP monitor last month.  Overall average of 63 readings was 138/82; daytime average 141/77 and night 129/66.  Does show a dipping pattern.    Wt Readings from Last 3 Encounters:  11/05/20 188 lb 6.4 oz (85.5 kg)  10/31/20 187 lb (84.8 kg)  10/29/20 182 lb (82.6 kg)  BP Readings from Last 3 Encounters:  11/05/20 138/84  10/31/20 132/78  10/08/20 (!) 164/88   Pulse Readings from Last 3 Encounters:  11/05/20 99  10/31/20 94  10/08/20 87    Past Medical History:  Diagnosis Date  . Abnormal Pap smear of cervix   . Anxiety   . Aortic atherosclerosis ([place])   . Atypical chest pain 10/08/2020   Symptoms are very atypical.  She feels good with exercise and notes that in general she is feeling better since she started the exercise program.  She had a normal stress test in 2021.  No repeat ischemia testing advised.  She does also report difficulty and pain with swallowing with referred pain into her back.  She has GI follow-up.  . Diverticulitis   .  Fatty liver disease, nonalcoholic   . Glaucoma   . H/O dizziness   . Hemorrhoids   . History of depression    incest survivor/dysthymia  . Hypertension   . Hyperthyroidism   . Hypokalemia 04/29/2020  . IBS (irritable bowel syndrome)    lactose intolerance  . Palpitations 04/29/2020  . Restless leg syndrome   . Sleep apnea   . Snoring 11/17/2012  . Thyroid disease    hypothyroid  . Varicose veins     Current Outpatient Medications on File Prior to Visit  Medication Sig Dispense Refill  . albuterol (VENTOLIN HFA) 108 (90 Base) MCG/ACT inhaler     . ALPRAZolam (XANAX) 0.25 MG tablet Take 0.125 mg by mouth every 6 (six) hours as needed for anxiety.    . bismuth subsalicylate (PEPTO BISMOL) 262 MG chewable tablet Chew 524 mg by mouth as needed for diarrhea or loose stools.    . carvedilol (COREG) 12.5 MG tablet Take 12.5 mg by mouth as needed. TAKE 1/2 TABLET AS NEEDED FOR PALPITATIONS/ELEVATED BLOOD PRESSURE    . EPINEPHrine 0.3 mg/0.3 mL IJ SOAJ injection Inject 0.3 mg into the muscle as needed for anaphylaxis.   0  . fluticasone (FLONASE) 50 MCG/ACT nasal spray Place 2 sprays into both nostrils daily as needed for allergies.     Marland Kitchen lactase (LACTAID) 3000 units tablet Take 3,000 Units by mouth as needed (lactaid).     Marland Kitchen levothyroxine (SYNTHROID) 112 MCG tablet Take 112 mcg by mouth daily.    . Magnesium 200 MG TABS Take 200 mg by mouth daily.    . Multiple Vitamin (MULTIVITAMIN WITH MINERALS) TABS Take 1 tablet by mouth daily.    . NONFORMULARY OR COMPOUNDED ITEM Vitamin E vaginal cream 200u/ml.  Apply 65ml vaginally and small amount externally two to three times weekly. 36 each 3  . saccharomyces boulardii (FLORASTOR) 250 MG capsule Take one tablet BID x 1 month 60 capsule 0  . timolol (TIMOPTIC) 0.5 % ophthalmic solution Place 1 drop into both eyes every morning.  3   No current facility-administered medications on file prior to visit.    Allergies  Allergen Reactions  . Sulfites  Anaphylaxis  . Amlodipine Other (See Comments)    Abdominal pain  . Iodine     Was told to place this as an allergy along with sulfites as she had almost anaphylactic reaction to shellfish years ago.  Clancy Gourd [Nitrofurantoin Macrocrystal] Nausea Only and Other (See Comments)    Headache  . Serotonin Reuptake Inhibitors (Ssris) Other (See Comments)  . Shellfish Allergy   . Venlafaxine Other (See Comments)  . Almond Oil Itching and Rash    ALL TREE NUTS  .  Bentyl [Dicyclomine Hcl] Anxiety    Patient noticed anxiety with this medication.    Blood pressure 138/84, pulse 99, resp. rate 16, height 5' 5.5" (1.664 m), weight 188 lb 6.4 oz (85.5 kg), last menstrual period 07/06/2010, SpO2 98 %.  Essential (primary) hypertension Patient with essential hypertension, running approximately 10 points (systolic) higher than goal.  She is not comfortable taking medication for this, as she would prefer to continue working on lifestyle modifications.  Asked that she continue with home monitoring about 2-3 times each week, and she can reach out to the office should she feel that the readings are not improving.  Praised her for changes in exercise and encouraged her to continue despite completing the PREP program.     Tommy Medal PharmD CPP [facility] [address] [place] [address] 11/05/2020 1:30 PM

## 2020-11-05 NOTE — Assessment & Plan Note (Signed)
Patient with essential hypertension, running approximately 10 points (systolic) higher than goal.  She is not comfortable taking medication for this, as she would prefer to continue working on lifestyle modifications.  Asked that she continue with home monitoring about 2-3 times each week, and she can reach out to the office should she feel that the readings are not improving.  Praised her for changes in exercise and encouraged her to continue despite completing the PREP program.

## 2020-11-05 NOTE — Patient Instructions (Signed)
If you have any questions or concerns about your home blood pressure readings, please feel free to call Tomma Ehinger/Raquel at [phone] or send a MyChart message to [facility].  Check your blood pressure at home 2-3 times each week and keep record of the readings.  Bring all of your meds, your BP cuff and your record of home blood pressures to your next appointment.  Exercise as you're able, try to walk approximately 30 minutes per day.  Keep salt intake to a minimum, especially watch canned and prepared boxed foods.  Eat more fresh fruits and vegetables and fewer canned items.  Avoid eating in fast food restaurants.    HOW TO TAKE YOUR BLOOD PRESSURE: . Rest 5 minutes before taking your blood pressure. .  Don't smoke or drink caffeinated beverages for at least 30 minutes before. . Take your blood pressure before (not after) you eat. . Sit comfortably with your back supported and both feet on the floor (don't cross your legs). . Elevate your arm to heart level on a table or a desk. . Use the proper sized cuff. It should fit smoothly and snugly around your bare upper arm. There should be enough room to slip a fingertip under the cuff. The bottom edge of the cuff should be 1 inch above the crease of the elbow. . Ideally, take 3 measurements at one sitting and record the average.

## 2020-11-08 DIAGNOSIS — R7303 Prediabetes: Secondary | ICD-10-CM | POA: Insufficient documentation

## 2020-11-08 DIAGNOSIS — F4521 Hypochondriasis: Secondary | ICD-10-CM | POA: Insufficient documentation

## 2020-11-08 DIAGNOSIS — F909 Attention-deficit hyperactivity disorder, unspecified type: Secondary | ICD-10-CM | POA: Insufficient documentation

## 2020-11-08 DIAGNOSIS — G4733 Obstructive sleep apnea (adult) (pediatric): Secondary | ICD-10-CM | POA: Insufficient documentation

## 2020-11-08 DIAGNOSIS — J302 Other seasonal allergic rhinitis: Secondary | ICD-10-CM | POA: Insufficient documentation

## 2020-11-08 DIAGNOSIS — G4721 Circadian rhythm sleep disorder, delayed sleep phase type: Secondary | ICD-10-CM | POA: Insufficient documentation

## 2020-11-08 DIAGNOSIS — K8020 Calculus of gallbladder without cholecystitis without obstruction: Secondary | ICD-10-CM | POA: Insufficient documentation

## 2020-11-08 DIAGNOSIS — N6019 Diffuse cystic mastopathy of unspecified breast: Secondary | ICD-10-CM | POA: Insufficient documentation

## 2020-11-08 DIAGNOSIS — R269 Unspecified abnormalities of gait and mobility: Secondary | ICD-10-CM | POA: Insufficient documentation

## 2020-11-08 DIAGNOSIS — G2581 Restless legs syndrome: Secondary | ICD-10-CM | POA: Insufficient documentation

## 2020-11-08 DIAGNOSIS — E669 Obesity, unspecified: Secondary | ICD-10-CM | POA: Insufficient documentation

## 2020-11-08 DIAGNOSIS — D696 Thrombocytopenia, unspecified: Secondary | ICD-10-CM | POA: Insufficient documentation

## 2020-11-12 NOTE — Progress Notes (Signed)
11/12/2020 Jodi [name] [id number] 10/20/1950   Chief Complaint: Epigastric pain, chest/back pain   History of Present Illness: Jodi [name] is a 70 year old female with a past medical history of anxiety, hypertension, hypothyroidism, sleep apnea does not use CPAP, fatty liver, jejunal diverticulitis 2019, IBS with chronic left-sided abdominal discomfort. Past cholecystectomy 07/2017. She was last seen in office by Dr. Hilarie Fredrickson on 02/05/2020.  At that time, her prior hemorrhoidal symptoms resolved following her lumbar spine chiropractic therapy.  She continued to have intermittent left-sided abdominal discomfort which was relieved by taking Pepto bismol and by avoiding dairy products.  She presents to our office today with complaints of having brief sharp pain to the epigastric, right upper quadrant and below the left shoulder blade which is occurred on 5 separate occasions over the past few months and lasted for at least 30 minutes.  She stated the first episode occurred while she was exercising using an upper body exercise machine at the [facility].  Two of the episodes occurred shortly after eating chicken salad and fried fish. The most recent episode occurred yesterday after she drank coffee and ate biscotti.  She took Pepto-Bismol which relieved her upper abdominal pain as well as the pain to the left shoulder blade area.  No nausea or vomiting.  She sometimes feels as if she cannot burp.  No heartburn.  No NSAIDs.  She takes Pepto-Bismol also a prior to eating away from home to prevent diarrhea.  No weight loss.  She denies ever having a colonoscopy.  She underwent 2 colonoscopies in 2017 and 2020 which were negative.  Father and brother with history of colon polyps.  No known family history of colorectal cancer.  No other complaints at this time.  Labs 10/31/2020: WBC 5.0. Hg 14.2. HCT 42.4. PLT 153.  Glucose 93.  BUN 10.  Creatinine 0.69.  Sodium 139.  Potassium 4.3.  Calcium 9.3.  Albumin 4.3.   Total bili 0.8.  Alk phos 53.  AST 21.  ALT 21.  TSH 2.61.   Current Outpatient Medications on File Prior to Visit  Medication Sig Dispense Refill  . albuterol (VENTOLIN HFA) 108 (90 Base) MCG/ACT inhaler     . ALPRAZolam (XANAX) 0.25 MG tablet Take 0.125 mg by mouth every 6 (six) hours as needed for anxiety.    . bismuth subsalicylate (PEPTO BISMOL) 262 MG chewable tablet Chew 524 mg by mouth as needed for diarrhea or loose stools.    . carvedilol (COREG) 12.5 MG tablet Take 12.5 mg by mouth as needed. TAKE 1/2 TABLET AS NEEDED FOR PALPITATIONS/ELEVATED BLOOD PRESSURE    . fluticasone (FLONASE) 50 MCG/ACT nasal spray Place 2 sprays into both nostrils daily as needed for allergies.     Marland Kitchen lactase (LACTAID) 3000 units tablet Take 3,000 Units by mouth as needed (lactaid).     Marland Kitchen levothyroxine (SYNTHROID) 112 MCG tablet Take 112 mcg by mouth daily.    . Magnesium 200 MG TABS Take 200 mg by mouth daily.    . Multiple Vitamin (MULTIVITAMIN WITH MINERALS) TABS Take 1 tablet by mouth daily.    . NONFORMULARY OR COMPOUNDED ITEM Vitamin E vaginal cream 200u/ml.  Apply 59m vaginally and small amount externally two to three times weekly. 36 each 3  . saccharomyces boulardii (FLORASTOR) 250 MG capsule Take one tablet BID x 1 month 60 capsule 0  . timolol (TIMOPTIC) 0.5 % ophthalmic solution Place 1 drop into both eyes every morning.  3  .  EPINEPHrine 0.3 mg/0.3 mL IJ SOAJ injection Inject 0.3 mg into the muscle as needed for anaphylaxis.  (Patient not taking: Reported on 11/13/2020)  0   No current facility-administered medications on file prior to visit.   Allergies  Allergen Reactions  . Sulfites Anaphylaxis  . Amlodipine Other (See Comments)    Abdominal pain  . Iodine     Was told to place this as an allergy along with sulfites as she had almost anaphylactic reaction to shellfish years ago.  . Macrodantin [Nitrofurantoin Macrocrystal] Nausea Only and Other (See Comments)    Headache  . Serotonin  Reuptake Inhibitors (Ssris) Other (See Comments)  . Shellfish Allergy   . Venlafaxine Other (See Comments)  . Almond Oil Itching and Rash    ALL TREE NUTS  . Bentyl [Dicyclomine Hcl] Anxiety    Patient noticed anxiety with this medication.    Current Medications, Allergies, Past Medical History, Past Surgical History, Family History and Social History were reviewed in [facility] electronic medical record.   Review of Systems:   Constitutional: Negative for fever, sweats, chills or weight loss.  Respiratory: Negative for shortness of breath.   Cardiovascular: Negative for chest pain, palpitations and leg swelling.  Gastrointestinal: See HPI.  Musculoskeletal: Negative for back pain or muscle aches.  Neurological: Negative for dizziness, headaches or paresthesias.    Physical Exam: LMP 07/06/2010  General: 69 year old female in no acute distress. Head: Normocephalic and atraumatic. Eyes: No scleral icterus. Conjunctiva pink . Ears: Normal auditory acuity. Mouth: Dentition intact. No ulcers or lesions.  Lungs: Clear throughout to auscultation. Heart: Regular rate and rhythm, no murmur. Abdomen: Soft, nontender and nondistended. No masses or hepatomegaly. Normal bowel sounds x 4 quadrants.  Rectal: Deferred.  Musculoskeletal: Symmetrical with no gross deformities. Extremities: No edema. Neurological: Alert oriented x 4. No focal deficits.  Psychological: Alert and cooperative. Normal mood and affect  Assessment and Recommendations: 1. 69 year old female with epigastric pain, RUQ pain and pain below the left shoulder blade x 5 episodes over the past few months relieved by taking Pepto bismol. -H. Pylori stool antigen -GERD handout -Famotidine 20mg QD -I discussed scheduling an EGD, she wishes to avoid endoscopic evaluation at this time -Follow up with Dr. Pyrtle 12/19/2020 as scheduled  -Patient to call our office if her symptoms worsen  2. History of IBS with  intermittent left sided abdominal pain, relieved by taking Pepto bismol   3. History of jejunal diverticulitis  -Patient to call office if significant abdominal pain occurs  4. Colon cancer screening. Family history of colon polyps (father and brother). Negative Cologuard in 04/2016 and 04/2019 -Recommended conventional colonoscopy 04/2022, patient to discuss further with Dr. Pyrtle at the time of next follow up appointment     

## 2020-11-13 ENCOUNTER — Ambulatory Visit: Payer: Medicare Other | Admitting: Nurse Practitioner

## 2020-11-13 ENCOUNTER — Other Ambulatory Visit: Payer: Medicare Other

## 2020-11-13 ENCOUNTER — Encounter: Payer: Self-pay | Admitting: Nurse Practitioner

## 2020-11-13 VITALS — BP 154/80 | HR 104 | Ht 65.0 in | Wt 184.5 lb

## 2020-11-13 DIAGNOSIS — R1011 Right upper quadrant pain: Secondary | ICD-10-CM

## 2020-11-13 DIAGNOSIS — G8929 Other chronic pain: Secondary | ICD-10-CM

## 2020-11-13 DIAGNOSIS — R1013 Epigastric pain: Secondary | ICD-10-CM | POA: Diagnosis not present

## 2020-11-13 DIAGNOSIS — R1032 Left lower quadrant pain: Secondary | ICD-10-CM

## 2020-11-13 MED ORDER — FAMOTIDINE 20 MG PO TABS: 20.0000 mg | ORAL_TABLET | Freq: Every day | ORAL | 1 refills | Status: DC

## 2020-11-13 NOTE — Patient Instructions (Addendum)
If you are age 70 or older, your body mass index should be between 23-30. Your Body mass index is 30.7 kg/m. If this is out of the aforementioned range listed, please consider follow up with your Primary Care Provider.  A stool test has been ordered for you today. Please go to the lab and pick up the test kit. Press "B" on the elevator. The lab is located at the first door on the left as you exit the elevator.  HEALTHCARE LAWS AND MY CHART RESULTS: Due to recent changes in healthcare laws, you may see the results of your imaging and laboratory studies on MyChart before your provider has had a chance to review them.   We understand that in some cases there may be results that are confusing or concerning to you. Not all laboratory results come back in the same time frame and the provider may be waiting for multiple results in order to interpret others.  Please give [place] 48 hours in order for your provider to thoroughly review all the results before contacting the office for clarification of your results.    MEDICATION: We have sent the following medication to your pharmacy for you to pick up at your convenience: Famotidine 20 MG once a day.  RECOMMENDATIONS: Keep your follow up with Dr. Hilarie Fredrickson. Please call our office if your symptoms worsen.  Avoid fatty foods and see GERD information. It was great seeing you today! Thank you for entrusting me with your care and choosing [facility].  Noralyn Pick, CRNP   Gastroesophageal Reflux Disease, Adult  Gastroesophageal reflux (GER) happens when acid from the stomach flows up into the tube that connects the mouth and the stomach (esophagus). Normally, food travels down the esophagus and stays in the stomach to be digested. With GER, food and stomach acid sometimes move back up into the esophagus. You may have a disease called gastroesophageal reflux disease (GERD) if the reflux:  Happens often.  Causes frequent or very bad  symptoms.  Causes problems such as damage to the esophagus. When this happens, the esophagus becomes sore and swollen. Over time, GERD can make small holes (ulcers) in the lining of the esophagus. What are the causes? This condition is caused by a problem with the muscle between the esophagus and the stomach. When this muscle is weak or not normal, it does not close properly to keep food and acid from coming back up from the stomach. The muscle can be weak because of:  Tobacco use.  Pregnancy.  Having a certain type of hernia (hiatal hernia).  Alcohol use.  Certain foods and drinks, such as coffee, chocolate, onions, and peppermint. What increases the risk?  Being overweight.  Having a disease that affects your connective tissue.  Taking NSAIDs, such a ibuprofen. What are the signs or symptoms?  Heartburn.  Difficult or painful swallowing.  The feeling of having a lump in the throat.  A bitter taste in the mouth.  Bad breath.  Having a lot of saliva.  Having an upset or bloated stomach.  Burping.  Chest pain. Different conditions can cause chest pain. Make sure you see your doctor if you have chest pain.  Shortness of breath or wheezing.  A long-term cough or a cough at night.  Wearing away of the surface of teeth (tooth enamel).  Weight loss. How is this treated?  Making changes to your diet.  Taking medicine.  Having surgery. Treatment will depend on how bad your symptoms  are. Follow these instructions at home: Eating and drinking  Follow a diet as told by your doctor. You may need to avoid foods and drinks such as: ? Coffee and tea, with or without caffeine. ? Drinks that contain alcohol. ? Energy drinks and sports drinks. ? Bubbly (carbonated) drinks or sodas. ? Chocolate and cocoa. ? Peppermint and mint flavorings. ? Garlic and onions. ? Horseradish. ? Spicy and acidic foods. These include peppers, chili powder, curry powder, vinegar, hot  sauces, and BBQ sauce. ? Citrus fruit juices and citrus fruits, such as oranges, lemons, and limes. ? Tomato-based foods. These include red sauce, chili, salsa, and pizza with red sauce. ? Fried and fatty foods. These include donuts, french fries, potato chips, and high-fat dressings. ? High-fat meats. These include hot dogs, rib eye steak, sausage, ham, and bacon. ? High-fat dairy items, such as whole milk, butter, and cream cheese.  Eat small meals often. Avoid eating large meals.  Avoid drinking large amounts of liquid with your meals.  Avoid eating meals during the 2-3 hours before bedtime.  Avoid lying down right after you eat.  Do not exercise right after you eat.   Lifestyle  Do not smoke or use any products that contain nicotine or tobacco. If you need help quitting, ask your doctor.  Try to lower your stress. If you need help doing this, ask your doctor.  If you are overweight, lose an amount of weight that is healthy for you. Ask your doctor about a safe weight loss goal.   General instructions  Pay attention to any changes in your symptoms.  Take over-the-counter and prescription medicines only as told by your doctor.  Do not take aspirin, ibuprofen, or other NSAIDs unless your doctor says it is okay.  Wear loose clothes. Do not wear anything tight around your waist.  Raise (elevate) the head of your bed about 6 inches (15 cm). You may need to use a wedge to do this.  Avoid bending over if this makes your symptoms worse.  Keep all follow-up visits. Contact a doctor if:  You have new symptoms.  You lose weight and you do not know why.  You have trouble swallowing or it hurts to swallow.  You have wheezing or a cough that keeps happening.  You have a hoarse voice.  Your symptoms do not get better with treatment. Get help right away if:  You have sudden pain in your arms, neck, jaw, teeth, or back.  You suddenly feel sweaty, dizzy, or  light-headed.  You have chest pain or shortness of breath.  You vomit and the vomit is green, yellow, or black, or it looks like blood or coffee grounds.  You faint.  Your poop (stool) is red, bloody, or black.  You cannot swallow, drink, or eat. These symptoms may represent a serious problem that is an emergency. Do not wait to see if the symptoms will go away. Get medical help right away. Call your local emergency services (911 in the [place] Do not drive yourself to the hospital. Summary  If a person has gastroesophageal reflux disease (GERD), food and stomach acid move back up into the esophagus and cause symptoms or problems such as damage to the esophagus.  Treatment will depend on how bad your symptoms are.  Follow a diet as told by your doctor.  Take all medicines only as told by your doctor. This information is not intended to replace advice given to you by your health care  provider. Make sure you discuss any questions you have with your health care provider. Document Revised: 01/01/2020 Document Reviewed: 01/01/2020 Elsevier Patient Education  [place].

## 2020-11-18 DIAGNOSIS — I10 Essential (primary) hypertension: Secondary | ICD-10-CM | POA: Diagnosis not present

## 2020-11-18 DIAGNOSIS — E039 Hypothyroidism, unspecified: Secondary | ICD-10-CM | POA: Diagnosis not present

## 2020-11-18 DIAGNOSIS — E7800 Pure hypercholesterolemia, unspecified: Secondary | ICD-10-CM | POA: Diagnosis not present

## 2020-11-20 ENCOUNTER — Other Ambulatory Visit: Payer: Medicare Other

## 2020-11-20 DIAGNOSIS — R1032 Left lower quadrant pain: Secondary | ICD-10-CM | POA: Diagnosis not present

## 2020-11-20 DIAGNOSIS — G8929 Other chronic pain: Secondary | ICD-10-CM

## 2020-11-20 DIAGNOSIS — R1011 Right upper quadrant pain: Secondary | ICD-10-CM

## 2020-11-20 DIAGNOSIS — R1013 Epigastric pain: Secondary | ICD-10-CM

## 2020-11-22 LAB — H. PYLORI ANTIGEN, STOOL: H pylori Ag, Stl: POSITIVE — AB

## 2020-11-25 ENCOUNTER — Other Ambulatory Visit: Payer: Self-pay | Admitting: Nurse Practitioner

## 2020-11-25 MED ORDER — OMEPRAZOLE MAGNESIUM 20 MG PO TBEC: 20.0000 mg | DELAYED_RELEASE_TABLET | Freq: Two times a day (BID) | ORAL | 0 refills | Status: DC

## 2020-11-25 MED ORDER — PYLERA 140-125-125 MG PO CAPS: 3.0000 | ORAL_CAPSULE | Freq: Three times a day (TID) | ORAL | 0 refills | Status: DC

## 2020-11-26 ENCOUNTER — Other Ambulatory Visit: Payer: Self-pay

## 2020-11-26 DIAGNOSIS — R1013 Epigastric pain: Secondary | ICD-10-CM

## 2020-11-29 NOTE — Progress Notes (Signed)
Addendum: Reviewed and agree with assessment and management plan. Fae Blossom M, MD  

## 2020-12-03 ENCOUNTER — Telehealth: Payer: Self-pay | Admitting: Nurse Practitioner

## 2020-12-03 NOTE — Telephone Encounter (Signed)
Inbound call from patient stating Pylera medication requires a prior authorization.  Please advise.

## 2020-12-04 ENCOUNTER — Encounter: Payer: Self-pay | Admitting: *Deleted

## 2020-12-04 IMAGING — CR DG CHEST 2V
2 series · 2 of 2 positions shown · non-contrast
Comparison: April 23, 2020.

CLINICAL DATA: Shortness of breath.

EXAM:
CHEST - 2 VIEW

[w chest pa]
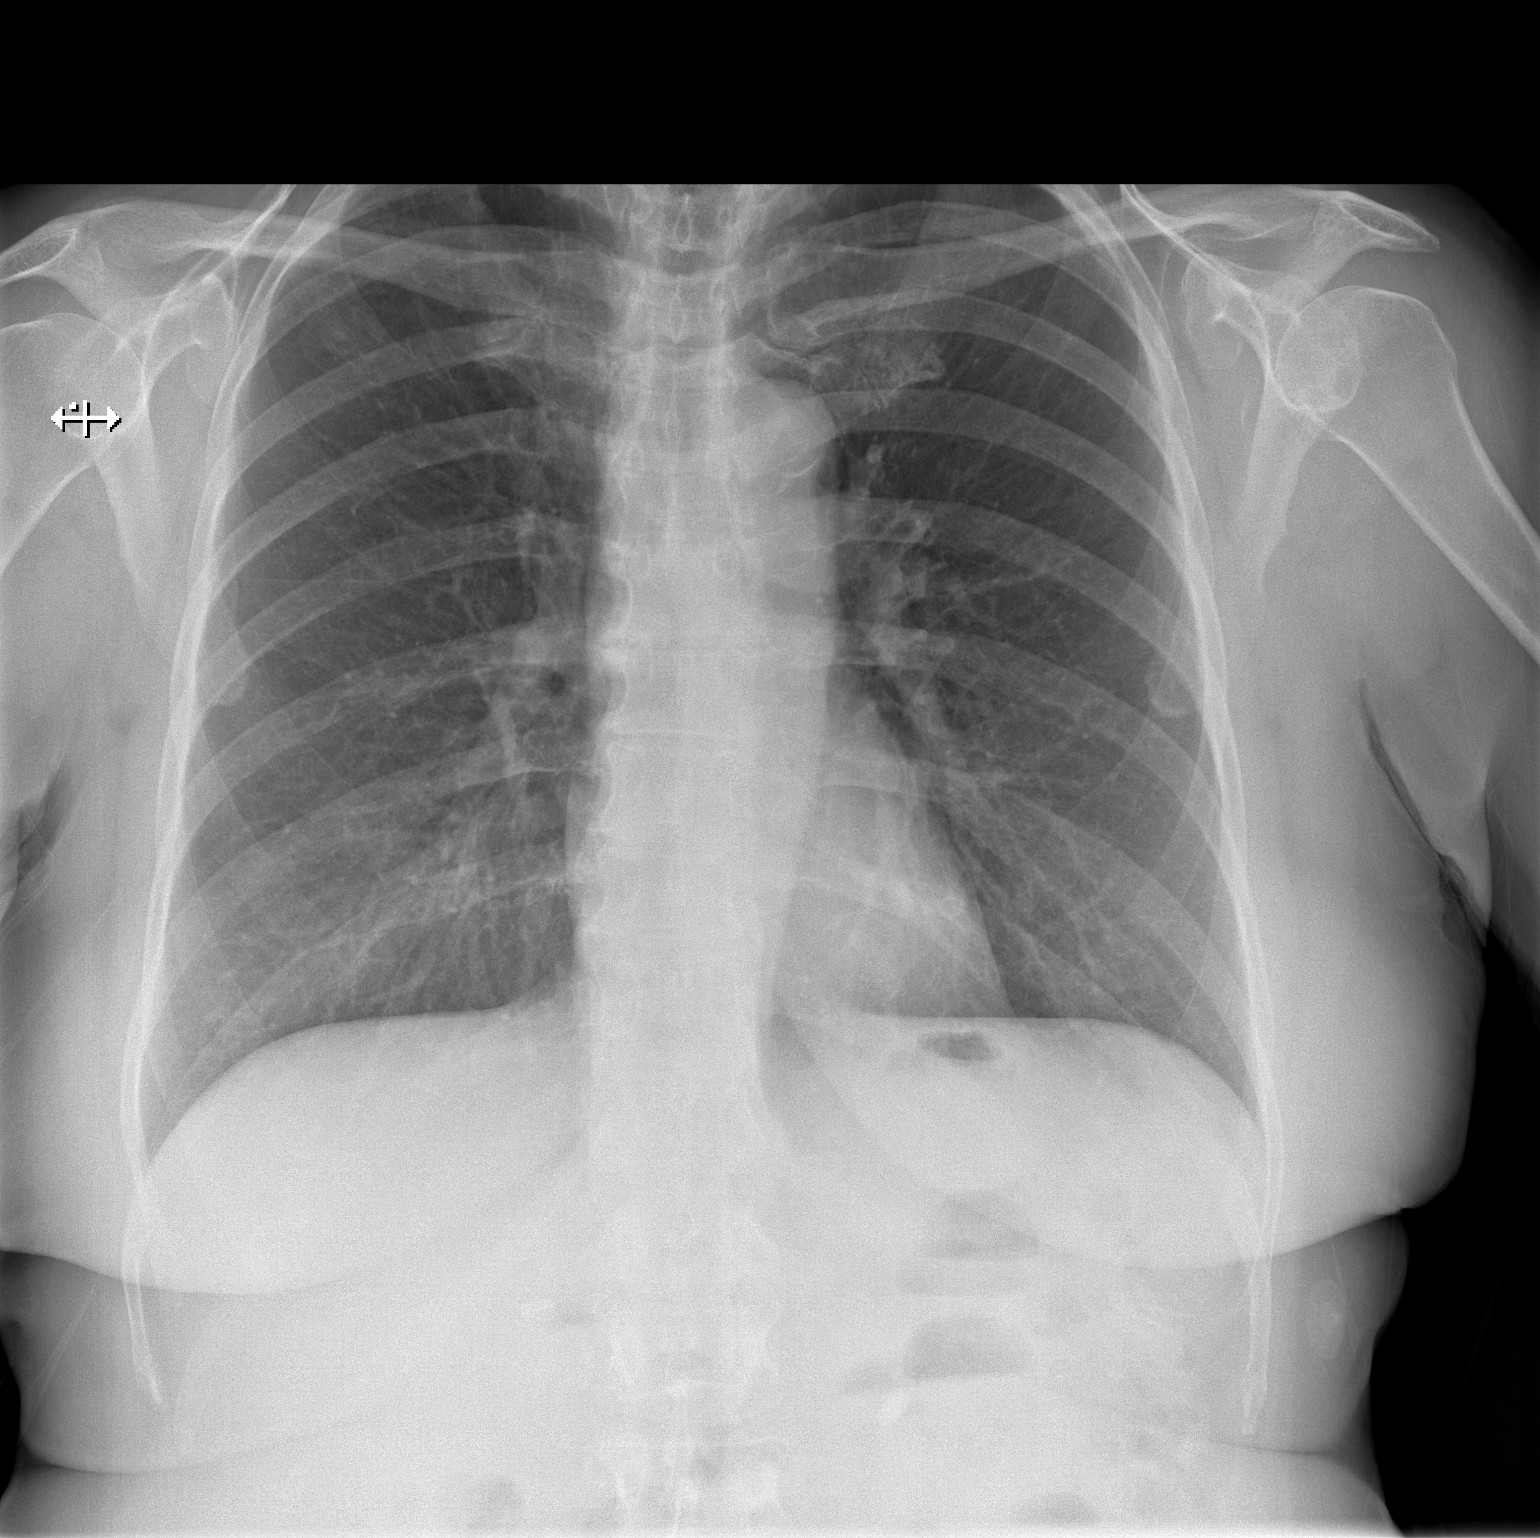

[w chest lat]
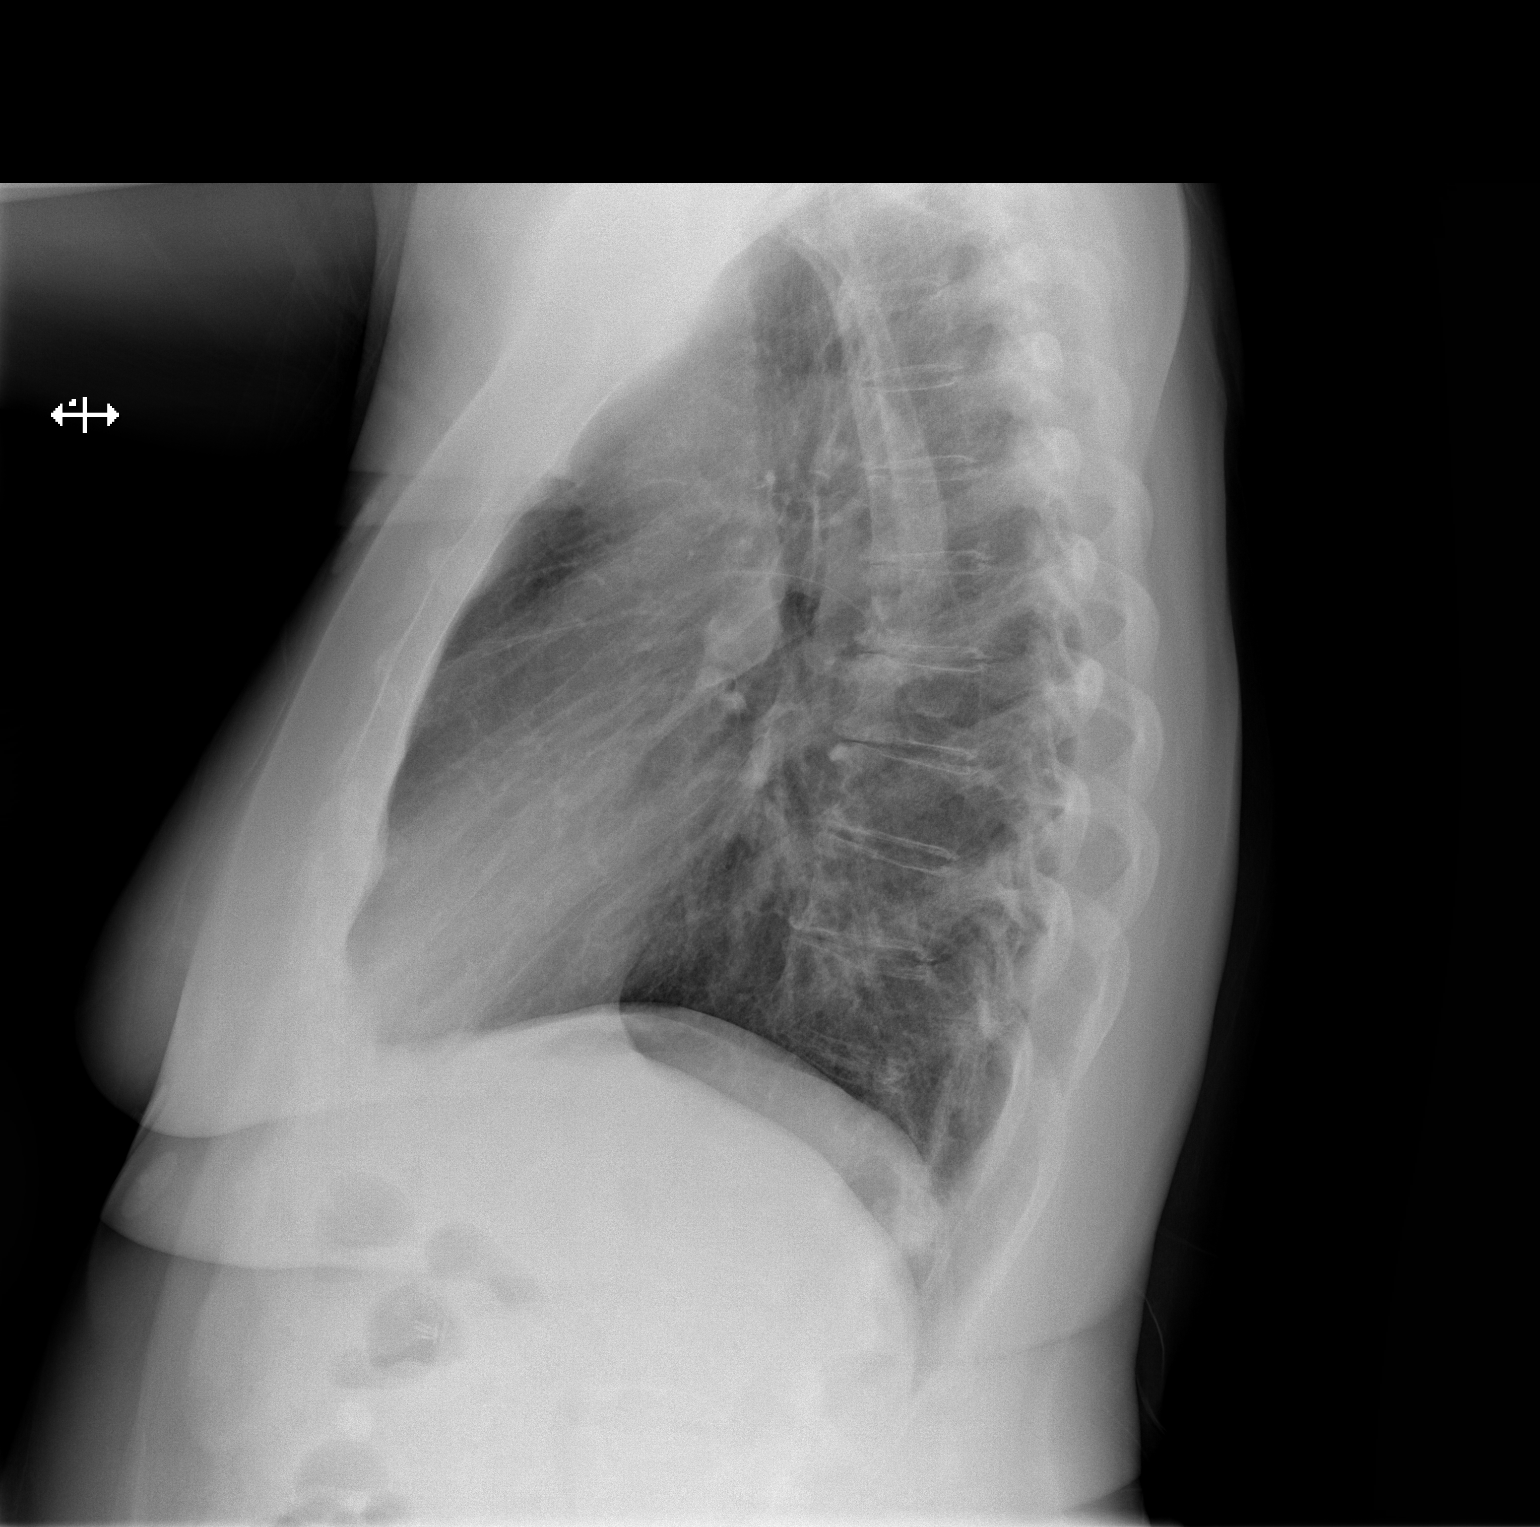

[2 of 2 positions shown; findings below may reference images not displayed]

FINDINGS: The heart size and mediastinal contours are within normal limits.
Both lungs are clear. No pneumothorax or pleural effusion is noted.
The visualized skeletal structures are unremarkable.
IMPRESSION: No active cardiopulmonary disease.

## 2020-12-06 NOTE — Telephone Encounter (Signed)
Please advise:  The following alternatives are the preferred alternatives: Combination of use of amoxicillin with clarithromycin with one of the following [esomeprazole or lansoprazole or omeprazole or pantoprazole tablet or rabeprazole] as separate products. Would you like to switch to the provided preferred alternatives?

## 2020-12-09 MED ORDER — AMOXICILLIN 500 MG PO TABS: ORAL_TABLET | ORAL | 0 refills | Status: DC

## 2020-12-09 MED ORDER — PEPTO-BISMOL 262 MG PO TABS: ORAL_TABLET | ORAL | 0 refills | Status: DC

## 2020-12-09 MED ORDER — OMEPRAZOLE MAGNESIUM 20 MG PO TBEC: DELAYED_RELEASE_TABLET | ORAL | 0 refills | Status: DC

## 2020-12-09 MED ORDER — CLARITHROMYCIN 500 MG PO TABS: ORAL_TABLET | ORAL | 0 refills | Status: DC

## 2020-12-09 NOTE — Telephone Encounter (Signed)
Jodi [name] since Pylera was not covered, pls send in RX for Amoxicillin 500mg  tab take 2 tabs po bid x 10 days # 40, no refills and Clarithromycin 500mg  one tab po bid x 10 days # 20, no refills and Omeprazole 20mg  one po bid x 10 days # 20, no refills. Pepto bismol one tab po qid x 10 days # 40, no refills. Stay off all PPIs and acid reducing medications for 2 to 4 weeks after she completes the above treatment. Pls provide her with a lab order to repeat H. Pylori stool antigen test 2 to 4 weeks after she completes the above treatment. THX

## 2020-12-12 ENCOUNTER — Ambulatory Visit (HOSPITAL_BASED_OUTPATIENT_CLINIC_OR_DEPARTMENT_OTHER): Payer: Medicare Other | Admitting: Obstetrics & Gynecology

## 2020-12-12 ENCOUNTER — Encounter (HOSPITAL_BASED_OUTPATIENT_CLINIC_OR_DEPARTMENT_OTHER): Payer: Self-pay | Admitting: Obstetrics & Gynecology

## 2020-12-12 ENCOUNTER — Other Ambulatory Visit: Payer: Self-pay

## 2020-12-12 VITALS — BP 152/78 | HR 99 | Wt 184.0 lb

## 2020-12-12 DIAGNOSIS — N952 Postmenopausal atrophic vaginitis: Secondary | ICD-10-CM

## 2020-12-12 NOTE — Progress Notes (Signed)
GYNECOLOGY  VISIT  CC:   follow up  HPI: 70 y.o. G1P1 Married White or Caucasian female here for follow up after starting vaginal Vit E cream.  Reports she, at times, feels firmness on the posterior aspect of the vagina.  She was not sure what this was initially but now realizes this is stool.  Wonders if she is supposed to be able to feel this.  Denies vaginal bleeding.  Denies vaginal discharge.  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/2020   Cholelithiasis without obstruction 11/08/2020   Attention deficit hyperactivity disorder 11/08/2020   Abnormal gait 11/08/2020   Atypical chest pain 10/08/2020   Palpitations 04/29/2020   Hypokalemia 04/29/2020   Spider veins 11/23/2019   Edema of both legs 11/23/2019   CIN III (cervical intraepithelial neoplasia grade III) with severe dysplasia 03/05/2017   Anxiety disorder 01/04/2015   Essential (primary) hypertension 01/04/2015   Hypothyroidism 01/04/2015   Pure hypercholesterolemia 01/04/2015   Right upper quadrant pain 01/01/2015   Vertigo, labyrinthine 11/17/2012   Snoring 11/17/2012    Past Medical History:  Diagnosis Date   Abnormal Pap smear of cervix    Anxiety    Aortic atherosclerosis ([place])    Atypical chest pain 10/08/2020   Symptoms are very atypical.  She feels good with exercise and notes that in general she is feeling better since she started the exercise program.  She had a normal stress test in 2021.  No repeat ischemia testing advised.  She does also report difficulty and pain with swallowing with referred pain into her back.  She has GI follow-up.   Diverticulitis    Fatty  liver disease, nonalcoholic    Glaucoma    H. pylori infection    H/O dizziness    Hemorrhoids    History of depression    incest survivor/dysthymia   Hypertension    Hyperthyroidism    Hypokalemia 04/29/2020   IBS (irritable bowel syndrome)    lactose intolerance   Palpitations 04/29/2020   Restless leg syndrome    Sleep apnea    Snoring 11/17/2012   Thyroid disease    hypothyroid   Varicose veins     Past Surgical History:  Procedure Laterality Date   CHOLECYSTECTOMY  07/19/2017   Dr. Donne Hazel   COLPOSCOPY  99, 00, 02, 04, 06   ENDOVENOUS ABLATION SAPHENOUS VEIN W/ LASER Right 04-08-2015   endovenous laser ablation (right greater saphenous vein) by Victorino Dike MD     LEEP  3/09   CIN II/III    MEDS:   Current Outpatient Medications on File Prior to Visit  Medication Sig Dispense Refill   ALPRAZolam (XANAX) 0.25 MG tablet Take 0.125 mg by mouth every 6 (six) hours as needed for anxiety.     amoxicillin (AMOXIL) 500 MG tablet Take 2 tablets by mouth twice a day for 10 days. 40 tablet 0   bismuth subsalicylate (PEPTO BISMOL) 262 MG chewable tablet Chew 524 mg by mouth as needed for diarrhea or loose stools.     Bismuth Subsalicylate (PEPTO-BISMOL) 262 MG TABS Take 1 tablet 4 times a day for 10 days. 40 tablet 0   famotidine (PEPCID)  20 MG tablet Take 1 tablet (20 mg total) by mouth daily. 30 tablet 1   fluticasone (FLONASE) 50 MCG/ACT nasal spray Place 2 sprays into both nostrils daily as needed for allergies.      lactase (LACTAID) 3000 units tablet Take 3,000 Units by mouth as needed (lactaid).      levothyroxine (SYNTHROID) 112 MCG tablet Take 112 mcg by mouth daily.     Magnesium 200 MG TABS Take 200 mg by mouth daily.     Multiple Vitamin (MULTIVITAMIN WITH MINERALS) TABS Take 1 tablet by mouth daily.     NONFORMULARY OR COMPOUNDED ITEM Vitamin E vaginal cream 200u/ml.  Apply 93ml vaginally and small amount externally two to three times weekly. 36 each 3   omeprazole  (PRILOSEC OTC) 20 MG tablet Take 1 tablet twice a day for 10 days. 20 tablet 0   saccharomyces boulardii (FLORASTOR) 250 MG capsule Take one tablet BID x 1 month 60 capsule 0   timolol (TIMOPTIC) 0.5 % ophthalmic solution Place 1 drop into both eyes every morning.  3   albuterol (VENTOLIN HFA) 108 (90 Base) MCG/ACT inhaler  (Patient not taking: Reported on 12/12/2020)     bismuth-metronidazole-tetracycline (PYLERA) 140-125-125 MG capsule Take 3 capsules by mouth 4 (four) times daily -  before meals and at bedtime for 10 days. 120 capsule 0   clarithromycin (BIAXIN) 500 MG tablet Take 1 tablet by mouth twice a day for 10 days. (Patient not taking: Reported on 12/12/2020) 20 tablet 0   EPINEPHrine 0.3 mg/0.3 mL IJ SOAJ injection Inject 0.3 mg into the muscle as needed for anaphylaxis.  (Patient not taking: No sig reported)  0   No current facility-administered medications on file prior to visit.    ALLERGIES: Sulfites, Amlodipine, Iodine, Macrodantin [nitrofurantoin macrocrystal], Serotonin reuptake inhibitors (ssris), Shellfish allergy, Venlafaxine, Almond oil, and Bentyl [dicyclomine hcl]  Family History  Problem Relation Age of Onset   Hypertension Father    Diabetes Father    Dementia Father    Hyperlipidemia Father    Prostate cancer Father    Colon polyps Father    Parkinson's disease Mother    Dementia Mother    Hypertension Mother    Breast cancer Cousin        maternal side of family   Dementia Brother    Colon polyps Brother    Diabetes Brother    Seizures Brother    Heart disease Maternal Uncle    Diabetes Paternal Grandmother    Colon cancer Neg Hx    Esophageal cancer Neg Hx    Stomach cancer Neg Hx    Liver disease Neg Hx    Pancreatic cancer Neg Hx     SH:  married, non smoker  Review of Systems  Constitutional: Negative.   Genitourinary: Negative.    PHYSICAL EXAMINATION:    BP (!) 152/78   Pulse 99   Wt 184 lb (83.5 kg)   LMP 07/06/2010   BMI 30.62 kg/m      General appearance: alert, cooperative and appears stated age Lymph:  no inguinal LAD noted  Pelvic: External genitalia:  no lesions              Urethra:  normal appearing urethra with no masses, tenderness or lesions              Bartholins and Skenes: normal                 Vagina: atrophic appearing tissue  but improved moisture, no lesions or masses              Cervix: no lesions  Chaperone, Octaviano Batty, CMA, was present for exam.  Assessment/Plan: 1. Vaginal atrophy - will continue vaginal Vit E cream.  Does not need refill at this time.

## 2020-12-19 ENCOUNTER — Ambulatory Visit: Payer: Medicare Other | Admitting: Internal Medicine

## 2020-12-19 ENCOUNTER — Encounter: Payer: Self-pay | Admitting: Internal Medicine

## 2020-12-19 VITALS — BP 140/84 | HR 97 | Ht 65.5 in | Wt 184.0 lb

## 2020-12-19 DIAGNOSIS — K648 Other hemorrhoids: Secondary | ICD-10-CM | POA: Diagnosis not present

## 2020-12-19 DIAGNOSIS — K580 Irritable bowel syndrome with diarrhea: Secondary | ICD-10-CM | POA: Diagnosis not present

## 2020-12-19 DIAGNOSIS — A048 Other specified bacterial intestinal infections: Secondary | ICD-10-CM

## 2020-12-19 DIAGNOSIS — K219 Gastro-esophageal reflux disease without esophagitis: Secondary | ICD-10-CM | POA: Diagnosis not present

## 2020-12-19 NOTE — Progress Notes (Signed)
Subjective:    Patient ID: Jodi [name], female    DOB: 1951/06/13, 70 y.o.   MRN: [id number]  HPI Jodi [name] is a 70 year old female recently seen by Carl Best on 11/13/2020 with reports of epigastric and right upper quadrant pain improved with Pepto-Bismol, personal history of jejunal diverticulitis, fatty liver, chronic left-sided abdominal pain, anxiety, hypertension and hypothyroidism who is here for follow-up.  She is here alone today.  After her visit on May 11 Colleen checked an H. pylori stool antigen which was positive.  We prescribed Pylera and twice daily PPI.  We also recommended famotidine initially but this was changed once the H. pylori diagnosis was made.  She reports that her epigastric and right upper quadrant pain has improved and she feels that this is better when she avoids fried foods like fish.  She also started working out with [employer] at [employer].  She noticed initially with working out she would have more reflux type symptoms and also pain in the mid back but this has also improved particularly with dietary change.  She is not having frequent heartburn and there is no dysphagia or odynophagia.  Her appetite has been good.  She has continued Florastor twice daily.  She has diarrhea but this overall seems better.  She is using less Pepto-Bismol but when she needs it it is effective.  She has some intermittent hemorrhoidal irritation and she would like to make an appointment to discuss hemorrhoidal banding.  Of note she did not start Pylera because it took a while to get the medication approved.  She wanted to meet with me before starting it.  She was also noted to have hypokalemia due to HCTZ but this has been treated and documented to have improved.  She has been summoned for jury duty but she worries with her IBS worsened by anxiety that she will not be able to appear because of her loose stools and diarrhea.  She also has conflict with this because she knows  it is her duty but she worries about her IBS.   Review of Systems As per HPI, otherwise negative  Current Medications, Allergies, Past Medical History, Past Surgical History, Family History and Social History were reviewed in [employer] record.    Objective:   Physical Exam Ht 5' 5.5" (1.664 m)   Wt 184 lb (83.5 kg)   LMP 07/06/2010   BMI 30.15 kg/m  Gen: awake, alert, NAD HEENT: anicteric Neuro: nonfocal  CBC    Component Value Date/Time   WBC 5.8 04/27/2020 1146   RBC 4.72 04/27/2020 1146   HGB 14.9 04/27/2020 1146   HGB 14.2 12/21/2013 1155   HCT 43.9 04/27/2020 1146   PLT 175 04/27/2020 1146   MCV 93.0 04/27/2020 1146   MCH 31.6 04/27/2020 1146   MCHC 33.9 04/27/2020 1146   RDW 13.4 04/27/2020 1146   LYMPHSABS 1.1 04/23/2020 0745   MONOABS 0.4 04/23/2020 0745   EOSABS 0.1 04/23/2020 0745   BASOSABS 0.0 04/23/2020 0745   CMP     Component Value Date/Time   NA 141 05/15/2020 0936   K 4.0 05/15/2020 0936   CL 104 05/15/2020 0936   CO2 26 05/15/2020 0936   GLUCOSE 112 (H) 05/15/2020 0936   GLUCOSE 109 (H) 04/27/2020 1146   BUN 8 05/15/2020 0936   CREATININE 0.37 (L) 05/15/2020 0936   CALCIUM 9.0 05/15/2020 0936   PROT 7.3 04/23/2020 0745   ALBUMIN 4.3 04/23/2020 0745  AST 22 04/23/2020 0745   ALT 27 04/23/2020 0745   ALKPHOS 47 04/23/2020 0745   BILITOT 0.9 04/23/2020 0745   GFRNONAA 109 05/15/2020 0936   GFRNONAA >60 04/27/2020 1146   GFRAA 126 05/15/2020 0936         Assessment & Plan:  70 year old female recently seen by Carl Best on 11/13/2020 with reports of epigastric and right upper quadrant pain improved with Pepto-Bismol, personal history of jejunal diverticulitis, fatty liver, chronic left-sided abdominal pain, anxiety, hypertension and hypothyroidism who is here for follow-up.  H. pylori with probable gastritis --Pylera plus omeprazole 20 twice daily was prescribed but not yet started.  We discussed H.  pylori infection the risk of ulcers and gastritis associated with this infection and also that this infection can be carcinogenic.  For this reason I recommend that she proceed with antibiotic therapy but also continue her Florastor.  Pepto-Bismol which she uses for her IBS is a component of Pylera and so I expect she will tolerate this fairly well.  She also has some fear about C. 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  We also discussed this and C. difficile is less likely given metronidazole component and her plan to continue Florastor twice daily. -- Proceed with Pylera plus omeprazole 20 mg twice daily x14 days --Continue Florastor 250 mg twice daily --Plan H. pylori stool antigen to confirm eradication 6 weeks or so after antibiotics  2. GERD --probable intermittent GERD causing the mild epigastric and mid back discomfort.  Cannot exclude that this is also related to gastritis which we are treating as above.  We will monitor for now.  No alarm symptoms to warrant EGD at this moment.  3.  Internal hemorrhoids --intermittent itching, very scant blood with wiping and some prolapse symptoms.  We discussed this today but she prefers to make another appointment to discuss this in a dedicated fashion. --Hemorrhoidal banding appointment to be scheduled  4.  IBS with diarrhea and chronic left-sided pain --overall improved and she is managing this very well.  She does have flares particular related to anxiety.  She can continue current therapy including Pepto-Bismol, lactose avoidance and probiotic. --Continue current therapy --Letter for jury duty as I do think the anxiety and stress of this summons will likely flare this process.  5.  Family history of colon polyps --she has had negative Cologuard in October 2017 and October 2020.  She would be due in October 2023.  We will plan to discuss colonoscopy versus repeat Cologuard at that time  30 minutes  total spent today including patient facing time, coordination of care, reviewing medical history/procedures/pertinent radiology studies, and documentation of the encounter.

## 2020-12-19 NOTE — Patient Instructions (Addendum)
You have been scheduled for your first hemorrhoidal banding on 02/18/21 at 4:00 pm.  You have been scheduled for your second hemorrhoidal banding on 03/06/21 at 11:00 am.  Please take Pylera as prescribed along with your omeprazole.  Take Florastor while taking Pylera and after finishing Pylera.  If you are age 70 or older, your body mass index should be between 23-30. Your Body mass index is 30.15 kg/m. If this is out of the aforementioned range listed, please consider follow up with your Primary Care Provider.  If you are age 42 or younger, your body mass index should be between 19-25. Your Body mass index is 30.15 kg/m. If this is out of the aformentioned range listed, please consider follow up with your Primary Care Provider.   _________________________________________________________  The [place] [facility] providers would like to encourage you to use [facility] to communicate with providers for non-urgent requests or questions.  Due to long hold times on the telephone, sending your provider a message by [facility] may be a faster and more efficient way to get a response.  Please allow 48 business hours for a response.  Please remember that this is for non-urgent requests.   Due to recent changes in healthcare laws, you may see the results of your imaging and laboratory studies on MyChart before your provider has had a chance to review them.  We understand that in some cases there may be results that are confusing or concerning to you. Not all laboratory results come back in the same time frame and the provider may be waiting for multiple results in order to interpret others.  Please give [place] 48 hours in order for your provider to thoroughly review all the results before contacting the office for clarification of your results.

## 2020-12-24 DIAGNOSIS — E039 Hypothyroidism, unspecified: Secondary | ICD-10-CM | POA: Diagnosis not present

## 2020-12-24 DIAGNOSIS — I10 Essential (primary) hypertension: Secondary | ICD-10-CM | POA: Diagnosis not present

## 2020-12-24 DIAGNOSIS — E7800 Pure hypercholesterolemia, unspecified: Secondary | ICD-10-CM | POA: Diagnosis not present

## 2020-12-25 DIAGNOSIS — H401112 Primary open-angle glaucoma, right eye, moderate stage: Secondary | ICD-10-CM | POA: Diagnosis not present

## 2020-12-25 DIAGNOSIS — H53431 Sector or arcuate defects, right eye: Secondary | ICD-10-CM | POA: Diagnosis not present

## 2020-12-25 DIAGNOSIS — H401121 Primary open-angle glaucoma, left eye, mild stage: Secondary | ICD-10-CM | POA: Diagnosis not present

## 2020-12-25 DIAGNOSIS — H2513 Age-related nuclear cataract, bilateral: Secondary | ICD-10-CM | POA: Diagnosis not present

## 2020-12-31 ENCOUNTER — Encounter (HOSPITAL_BASED_OUTPATIENT_CLINIC_OR_DEPARTMENT_OTHER): Payer: Self-pay | Admitting: Obstetrics & Gynecology

## 2021-01-10 DIAGNOSIS — R3915 Urgency of urination: Secondary | ICD-10-CM | POA: Diagnosis not present

## 2021-01-12 DIAGNOSIS — E039 Hypothyroidism, unspecified: Secondary | ICD-10-CM | POA: Diagnosis not present

## 2021-01-12 DIAGNOSIS — E7800 Pure hypercholesterolemia, unspecified: Secondary | ICD-10-CM | POA: Diagnosis not present

## 2021-01-12 DIAGNOSIS — I10 Essential (primary) hypertension: Secondary | ICD-10-CM | POA: Diagnosis not present

## 2021-02-14 ENCOUNTER — Telehealth: Payer: Self-pay | Admitting: Internal Medicine

## 2021-02-14 ENCOUNTER — Other Ambulatory Visit: Payer: Medicare Other

## 2021-02-14 NOTE — Telephone Encounter (Signed)
Ok, please reschedule to next available banding spot. Thank you

## 2021-02-14 NOTE — Telephone Encounter (Signed)
Patient rescheduled #1 banding for 03/06/21 at 11:00am  #2 banding 04/08/21 at 4:00pm  Thanks

## 2021-02-18 ENCOUNTER — Encounter: Payer: Medicare Other | Admitting: Internal Medicine

## 2021-02-19 NOTE — Progress Notes (Signed)
Cardiology Office Note   Date:  02/20/2021   ID:  Jodi [name], DOB 20-Feb-1951, MRN [id number]  PCP:  Carol Ada, MD  Cardiologist:   Skeet Latch, MD   No chief complaint on file.   History of Present Illness: Jodi [name] is a 70 y.o. female with OSA not on CPAP, hypertension and anxiety here for follow up.  She was initially seen for the evaluation of palpitations and shortness of breath.  In early September she was talking to a neighbor and when she stood up she felt like her heart rate stopped and then she felt presyncopal but didn't pass out.  She reports four similar episodes.  She went to the ED each time.  The first time her potassium was noted to be low so she was given a potassium supplement.  Her potassium was 2.9.  She reports being on HCTZ for years.  She notes an occasional fluttering.  She also feels short of breath at times when it occurs.  She didn't feel it while wearing the monitor at the hospital.  She notes that her heart beats hard when she tries to exert herself.  She likes to walk a lot for exercise.  However now, even with carrying laundry up and down steps she is more short of breath than usual.  The symptoms mostly improved after she started potassium.  She continues to have lightheadedness if she bent over.  She saw her PCP and her BP was elevated so she was started on olmesartan.  However she thinks that she has white coat syndrome.  She noted vertigo so she stopped taking it.  She notes that she worries a lot about her health and gets anxious thinking about it.  She hears her heat when trying to sleep.  It seems to be regular.  She has no edema, orthopnea or PND.    Jodi [name] had orthostatic symptoms and palpitations.  Her potassium was also low.  She we switched from HCTZ to amlodipine.  She stopped amlodipine because it irritated her IBS.  She tried 1 dose of carvedilol and stopped it because it made her very sleepy.  She had an ETT 04/2020 that was  negative.  She achieved 7 METs on a Bruce protocol.  She also wore an ambulatory monitor that revealed PACs and sinus tachcyardia that corresponded to episodes of heart fluttering.  It was recommeded that she start carvedilol but felt fatigued.  She has OSA and has been off her CPAP machine for at least three years. She had a repeat sleep study and was negative for OSA. She was referred to PREP program through the [facility].   At her last appointment she reported atypical chest pain. She noted that her anxiety was poorly controlled She wore a 24-hour blood pressure monitor that showed her blood pressure was markedly elevated the first hour of wearing it and averaged 158/91. Her 24-hour average was 138/75. Today, she is feeling good overall. She feels stronger and healthier, and does not tire out as easily. Her palpitations have been improving since beginning the PREP program. Wilburn Mylar was the first time she felt any "flutters" in a while, and she had just walked up an incline near her house. Otherwise she has not had many palpitations at all. At home she has not been monitoring her blood pressure regularly. In the evenings her lower extremities develop mild swelling, but this dissipates by morning. In 01/2021 her exercise and diet suffered due to  several medical issues including an infection with  H. pylori. However, she has been exercising by walking up and down stairs, using hand weights, and gardening. Formally, she is starting back at the [facility] today. For her diet, she still believes she eats too many processed foods and sugar, and continues to work on this. She continues to follow-up with her therapist for her anxiety. She denies any chest pain, or shortness of breath. No lightheadedness, headaches, syncope, orthopnea, or PND.    Past Medical History:  Diagnosis Date   Abnormal Pap smear of cervix    Anxiety    Aortic atherosclerosis ([place])    Atypical chest pain 10/08/2020   Symptoms are very atypical.  She  feels good with exercise and notes that in general she is feeling better since she started the exercise program.  She had a normal stress test in 2021.  No repeat ischemia testing advised.  She does also report difficulty and pain with swallowing with referred pain into her back.  She has GI follow-up.   Diverticulitis    Fatty liver disease, nonalcoholic    Glaucoma    H. pylori infection    H/O dizziness    Hemorrhoids    History of depression    incest survivor/dysthymia   Hypertension    Hyperthyroidism    Hypokalemia 04/29/2020   IBS (irritable bowel syndrome)    lactose intolerance   Palpitations 04/29/2020   Restless leg syndrome    Sleep apnea    Snoring 11/17/2012   Thyroid disease    hypothyroid   Varicose veins     Past Surgical History:  Procedure Laterality Date   CHOLECYSTECTOMY  07/19/2017   Dr. Donne Hazel   COLPOSCOPY  99, 00, 02, 04, 06   ENDOVENOUS ABLATION SAPHENOUS VEIN W/ LASER Right 04-08-2015   endovenous laser ablation (right greater saphenous vein) by Victorino Dike MD     LEEP  3/09   CIN II/III     Current Outpatient Medications  Medication Sig Dispense Refill   albuterol (VENTOLIN HFA) 108 (90 Base) MCG/ACT inhaler      ALPRAZolam (XANAX) 0.25 MG tablet Take 0.125 mg by mouth every 6 (six) hours as needed for anxiety.     amoxicillin (AMOXIL) 500 MG tablet Take 2 tablets by mouth twice a day for 10 days. 40 tablet 0   bismuth subsalicylate (PEPTO BISMOL) 262 MG chewable tablet Chew 524 mg by mouth as needed for diarrhea or loose stools.     Bismuth Subsalicylate (PEPTO-BISMOL) 262 MG TABS Take 1 tablet 4 times a day for 10 days. 40 tablet 0   bismuth-metronidazole-tetracycline (PYLERA) 140-125-125 MG capsule Take 3 capsules by mouth 4 (four) times daily -  before meals and at bedtime for 10 days. 120 capsule 0   clarithromycin (BIAXIN) 500 MG tablet Take 1 tablet by mouth twice a day for 10 days. 20 tablet 0   EPINEPHrine 0.3 mg/0.3 mL IJ SOAJ  injection Inject 0.3 mg into the muscle as needed for anaphylaxis.  0   famotidine (PEPCID) 20 MG tablet Take 1 tablet (20 mg total) by mouth daily. 30 tablet 1   fluticasone (FLONASE) 50 MCG/ACT nasal spray Place 2 sprays into both nostrils daily as needed for allergies.      lactase (LACTAID) 3000 units tablet Take 3,000 Units by mouth as needed (lactaid).      levothyroxine (SYNTHROID) 112 MCG tablet Take 112 mcg by mouth daily.     Magnesium 200 MG TABS  Take 200 mg by mouth daily.     Multiple Vitamin (MULTIVITAMIN WITH MINERALS) TABS Take 1 tablet by mouth daily.     NONFORMULARY OR COMPOUNDED ITEM Vitamin E vaginal cream 200u/ml.  Apply 104m vaginally and small amount externally two to three times weekly. 36 each 3   omeprazole (PRILOSEC OTC) 20 MG tablet Take 1 tablet twice a day for 10 days. 20 tablet 0   saccharomyces boulardii (FLORASTOR) 250 MG capsule Take one tablet BID x 1 month 60 capsule 0   timolol (TIMOPTIC) 0.5 % ophthalmic solution Place 1 drop into both eyes every morning.  3   No current facility-administered medications for this visit.    Allergies:   Sulfites, Amlodipine, Iodine, Macrodantin [nitrofurantoin macrocrystal], Serotonin reuptake inhibitors (ssris), Shellfish allergy, Venlafaxine, Almond oil, and Bentyl [dicyclomine hcl]    Social History:  The patient  reports that she quit smoking about 42 years ago. Her smoking use included cigarettes. She has never used smokeless tobacco. She reports that she does not currently use alcohol. She reports that she does not use drugs.   Family History:  The patient's family history includes Breast cancer in her cousin; Colon polyps in her brother and father; Dementia in her brother, father, and mother; Diabetes in her brother, father, and paternal grandmother; Heart disease in her maternal uncle; Hyperlipidemia in her father; Hypertension in her father and mother; Parkinson's disease in her mother; Prostate cancer in her father;  Seizures in her brother.    ROS:   Please see the history of present illness. (+) Palpitations (One episode yesterday) (+) Anxiety (+) Bilateral LE edema All other systems are reviewed and negative.    PHYSICAL EXAM: VS:  BP 132/72 (BP Location: Left Arm, Patient Position: Sitting)   Pulse 83   Ht 5' 5.5" (1.664 m)   Wt 186 lb 3.2 oz (84.5 kg)   LMP 07/06/2010   BMI 30.51 kg/m  , BMI Body mass index is 30.51 kg/m. GENERAL:  Well appearing HEENT: Pupils equal round and reactive, fundi not visualized, oral mucosa unremarkable NECK:  No jugular venous distention, waveform within normal limits, carotid upstroke brisk and symmetric, no bruits LUNGS:  Clear to auscultation bilaterally HEART:  RRR.  PMI not displaced or sustained,S1 and S2 within normal limits, no S3, no S4, no clicks, no rubs, no murmurs ABD:  Flat, positive bowel sounds normal in frequency in pitch, no bruits, no rebound, no guarding, no midline pulsatile mass, no hepatomegaly, no splenomegaly EXT:  2 plus pulses throughout, no edema, no cyanosis no clubbing SKIN:  No rashes no nodules NEURO:  Cranial nerves II through XII grossly intact, motor grossly intact throughout PSYCH:  Cognitively intact, oriented to person place and time   EKG:   02/20/2021: Sinus rhythm. Rate 83 bpm. 10/08/20: EKG was not ordered. 04/23/2020: Sinus rhythm.  Rate 96 bpm.  Low voltage precordial leads.  Blood Pressure Monitor 10/21/2020: 24 Hour Ambulatory BP Report   24 Hour Average BP: 138/75, HR 82 Average Daily BP : 141/77, HR  82 Average Night BP: 129/66, HR 78 Percentage Nightly Change: -7.9% in SBP, -14.2% DBP White Coat Period (1st Hour): Average BP 158/91, HR 101 Overall BP Range: 100-172/56-100   Interpretation:  Stage I Hypertension with superimposed White Coat Hypertension  ETT 05/01/20: Upsloping ST segment depression ST segment depression was noted during stress in the II, III, aVF, V6, V5, V4 and V3 leads. Blood  pressure demonstrated a normal response to exercise. Negative, adequate  stress test.  7 Day Event Monitor 05/2020:   Quality: Fair.  Baseline artifact. Predominant rhythm: sinus rhythm Average heart rate: 85 bpm Max heart rate: 140 bpm Min heart rate: 58 bpm Pauses >2.5 seconds: none Occasional PACs 8 beat run of atrial tachycardia   Patient reported heart fluttering. At these times, sinus rhythm, PACs and sinus tachycardia were noted.   Recent Labs: 04/10/2020: Magnesium 2.1; TSH 0.616 04/23/2020: ALT 27; B Natriuretic Peptide 21.5 04/27/2020: Hemoglobin 14.9; Platelets 175 05/15/2020: BUN 8; Creatinine, Ser 0.37; Potassium 4.0; Sodium 141    Lipid Panel No results found for: CHOL, TRIG, HDL, CHOLHDL, VLDL, LDLCALC, LDLDIRECT    Wt Readings from Last 3 Encounters:  02/20/21 186 lb 3.2 oz (84.5 kg)  12/19/20 184 lb (83.5 kg)  12/12/20 184 lb (83.5 kg)      ASSESSMENT AND PLAN: Essential (primary) hypertension Blood pressure was very minimally elevated initially and on repeat.Blood pressure she no longer checks it at home because it makes her anxious.  I think she mostly has whitecoat hypertension superimposed on very mild essential hypertension.  For now she will continue focusing on diet and exercise.  We will not start any new medications.  We will repeat her ambulatory blood pressure monitor in 6 months and she will follow-up with me afterwards.  Pure hypercholesterolemia ASCVD 10-year risk 10.3%.  We will need to discuss statins if her lipids remain elevated at follow-up.   Current medicines are reviewed at length with the patient today.  The patient does not have concerns regarding medicines.  The following changes have been made: none   Labs/ tests ordered today include:   Orders Placed This Encounter  Procedures   24 hour blood pressure monitor   EKG 12-Lead     Disposition: FU with Shaela Boer C. Oval Linsey, MD, [facility] in 6 months.   I,Mathew Stumpf,acting  as a Education administrator for Skeet Latch, MD.,have documented all relevant documentation on the behalf of Skeet Latch, MD,as directed by  Skeet Latch, MD while in the presence of Skeet Latch, MD.  I, Rock Oval Linsey, MD have reviewed all documentation for this visit.  The documentation of the exam, diagnosis, procedures, and orders on 02/20/2021 are all accurate and complete.   Signed, Thayer Inabinet C. Oval Linsey, MD, [facility]  02/20/2021 12:55 PM     [facility]

## 2021-02-20 ENCOUNTER — Encounter (HOSPITAL_BASED_OUTPATIENT_CLINIC_OR_DEPARTMENT_OTHER): Payer: Self-pay | Admitting: Cardiovascular Disease

## 2021-02-20 ENCOUNTER — Other Ambulatory Visit: Payer: Self-pay

## 2021-02-20 ENCOUNTER — Other Ambulatory Visit: Payer: Medicare Other

## 2021-02-20 ENCOUNTER — Ambulatory Visit (HOSPITAL_BASED_OUTPATIENT_CLINIC_OR_DEPARTMENT_OTHER): Payer: Medicare Other | Admitting: Cardiovascular Disease

## 2021-02-20 DIAGNOSIS — E7800 Pure hypercholesterolemia, unspecified: Secondary | ICD-10-CM

## 2021-02-20 DIAGNOSIS — I10 Essential (primary) hypertension: Secondary | ICD-10-CM | POA: Diagnosis not present

## 2021-02-20 DIAGNOSIS — R1013 Epigastric pain: Secondary | ICD-10-CM | POA: Diagnosis not present

## 2021-02-20 NOTE — Assessment & Plan Note (Signed)
Blood pressure was very minimally elevated initially and on repeat.Blood pressure she no longer checks it at home because it makes her anxious.  I think she mostly has whitecoat hypertension superimposed on very mild essential hypertension.  For now she will continue focusing on diet and exercise.  We will not start any new medications.  We will repeat her ambulatory blood pressure monitor in 6 months and she will follow-up with me afterwards.

## 2021-02-20 NOTE — Patient Instructions (Signed)
Medication Instructions:  Your physician recommends that you continue on your current medications as directed. Please refer to the Current Medication list given to you today.   *If you need a refill on your cardiac medications before your next appointment, please call your pharmacy*  Lab Work: NONE   Testing/Procedures: 24 HOUR BLOOD PRESSURE MONITORING SYSTEM IN 6 MONTHS   Follow-Up: At [facility], you and your health needs are our priority.  As part of our continuing mission to provide you with exceptional heart care, we have created designated Provider Care Teams.  These Care Teams include your primary Cardiologist (physician) and Advanced Practice Providers (APPs -  Physician Assistants and Nurse Practitioners) who all work together to provide you with the care you need, when you need it.  We recommend signing up for the patient portal called "MyChart".  Sign up information is provided on this After Visit Summary.  MyChart is used to connect with patients for Virtual Visits (Telemedicine).  Patients are able to view lab/test results, encounter notes, upcoming appointments, etc.  Non-urgent messages can be sent to your provider as well.   To learn more about what you can do with MyChart, go to [email or web address].    Your next appointment:   6 month(s) FEW WEEKS AFTER BLOOD PRESSURE MONITOR   The format for your next appointment:   In Person  Provider:   Skeet Latch, MD

## 2021-02-20 NOTE — Assessment & Plan Note (Signed)
ASCVD 10-year risk 10.3%.  We will need to discuss statins if her lipids remain elevated at follow-up.

## 2021-02-22 LAB — H. PYLORI ANTIGEN, STOOL: H pylori Ag, Stl: NEGATIVE

## 2021-03-06 ENCOUNTER — Encounter: Payer: Medicare Other | Admitting: Internal Medicine

## 2021-04-08 ENCOUNTER — Encounter: Payer: Medicare Other | Admitting: Internal Medicine

## 2021-05-01 DIAGNOSIS — E7800 Pure hypercholesterolemia, unspecified: Secondary | ICD-10-CM | POA: Diagnosis not present

## 2021-05-01 DIAGNOSIS — E039 Hypothyroidism, unspecified: Secondary | ICD-10-CM | POA: Diagnosis not present

## 2021-05-01 DIAGNOSIS — R7303 Prediabetes: Secondary | ICD-10-CM | POA: Diagnosis not present

## 2021-05-01 DIAGNOSIS — R030 Elevated blood-pressure reading, without diagnosis of hypertension: Secondary | ICD-10-CM | POA: Diagnosis not present

## 2021-05-12 ENCOUNTER — Ambulatory Visit (INDEPENDENT_AMBULATORY_CARE_PROVIDER_SITE_OTHER): Payer: Medicare Other | Admitting: Obstetrics & Gynecology

## 2021-05-12 ENCOUNTER — Other Ambulatory Visit: Payer: Self-pay

## 2021-05-12 ENCOUNTER — Other Ambulatory Visit (HOSPITAL_COMMUNITY)
Admission: RE | Admit: 2021-05-12 | Discharge: 2021-05-12 | Disposition: A | Discharge status: Home / Self Care | Payer: Medicare Other | Source: Ambulatory Visit | Attending: Obstetrics & Gynecology | Admitting: Obstetrics & Gynecology

## 2021-05-12 ENCOUNTER — Encounter (HOSPITAL_BASED_OUTPATIENT_CLINIC_OR_DEPARTMENT_OTHER): Payer: Self-pay | Admitting: Obstetrics & Gynecology

## 2021-05-12 VITALS — BP 157/75 | HR 92 | Ht 65.5 in | Wt 192.2 lb

## 2021-05-12 DIAGNOSIS — Z780 Asymptomatic menopausal state: Secondary | ICD-10-CM

## 2021-05-12 DIAGNOSIS — Z8541 Personal history of malignant neoplasm of cervix uteri: Secondary | ICD-10-CM | POA: Insufficient documentation

## 2021-05-12 DIAGNOSIS — R7303 Prediabetes: Secondary | ICD-10-CM | POA: Diagnosis not present

## 2021-05-12 DIAGNOSIS — B977 Papillomavirus as the cause of diseases classified elsewhere: Secondary | ICD-10-CM | POA: Insufficient documentation

## 2021-05-12 DIAGNOSIS — Z9189 Other specified personal risk factors, not elsewhere classified: Secondary | ICD-10-CM | POA: Diagnosis not present

## 2021-05-12 DIAGNOSIS — Z1151 Encounter for screening for human papillomavirus (HPV): Secondary | ICD-10-CM | POA: Insufficient documentation

## 2021-05-12 DIAGNOSIS — Z01419 Encounter for gynecological examination (general) (routine) without abnormal findings: Secondary | ICD-10-CM | POA: Diagnosis present

## 2021-05-12 DIAGNOSIS — M8580 Other specified disorders of bone density and structure, unspecified site: Secondary | ICD-10-CM

## 2021-05-12 DIAGNOSIS — Z9889 Other specified postprocedural states: Secondary | ICD-10-CM | POA: Diagnosis not present

## 2021-05-12 DIAGNOSIS — D069 Carcinoma in situ of cervix, unspecified: Secondary | ICD-10-CM | POA: Insufficient documentation

## 2021-05-12 NOTE — Progress Notes (Signed)
70 y.o. G1P1 Married White or Caucasian female here for breast and pelvic exam.  I am also following her for h/o CIN 3 and h/o cervical LEEP.    Denies vaginal bleeding.  Patient's last menstrual period was 07/06/2010.          Sexually active: No.  H/O STD:  yes, h/o HR HPV  Health Maintenance: PCP:  Dr. Tamala Julian.  Last wellness appt was last week.  Did blood work at that appt:  yes Vaccines are up to date:  pt is sure she had her pneumonia vaccination but I  Colonoscopy:  04/2019 MMG:  10/10/2020 Negative BMD:  10/10/2020 Last pap smear:  05/09/2020 Negative.   H/o abnormal pap smear:  yes   reports that she quit smoking about 42 years ago. Her smoking use included cigarettes. She has never used smokeless tobacco. She reports that she does not currently use alcohol. She reports that she does not use drugs.  Past Medical History:  Diagnosis Date   Abnormal Pap smear of cervix    Anxiety    Aortic atherosclerosis ([place])    Atypical chest pain 10/08/2020   Symptoms are very atypical.  She feels good with exercise and notes that in general she is feeling better since she started the exercise program.  She had a normal stress test in 2021.  No repeat ischemia testing advised.  She does also report difficulty and pain with swallowing with referred pain into her back.  She has GI follow-up.   Diverticulitis    Fatty liver disease, nonalcoholic    Glaucoma    H. pylori infection    H/O dizziness    Hemorrhoids    History of depression    incest survivor/dysthymia   Hypertension    Hyperthyroidism    Hypokalemia 04/29/2020   IBS (irritable bowel syndrome)    lactose intolerance   Palpitations 04/29/2020   Restless leg syndrome    Sleep apnea    Snoring 11/17/2012   Thyroid disease    hypothyroid   Varicose veins     Past Surgical History:  Procedure Laterality Date   CHOLECYSTECTOMY  07/19/2017   Dr. Donne Hazel   COLPOSCOPY  99, 00, 02, 04, 06   ENDOVENOUS ABLATION SAPHENOUS  VEIN W/ LASER Right 04-08-2015   endovenous laser ablation (right greater saphenous vein) by Victorino Dike MD     LEEP  3/09   CIN II/III    Current Outpatient Medications  Medication Sig Dispense Refill   albuterol (VENTOLIN HFA) 108 (90 Base) MCG/ACT inhaler      ALPRAZolam (XANAX) 0.25 MG tablet Take 0.125 mg by mouth every 6 (six) hours as needed for anxiety.     Bismuth Subsalicylate (PEPTO-BISMOL) 262 MG TABS Take 1 tablet 4 times a day for 10 days. 40 tablet 0   EPINEPHrine 0.3 mg/0.3 mL IJ SOAJ injection Inject 0.3 mg into the muscle as needed for anaphylaxis.  0   fluticasone (FLONASE) 50 MCG/ACT nasal spray Place 2 sprays into both nostrils daily as needed for allergies.      lactase (LACTAID) 3000 units tablet Take 3,000 Units by mouth as needed (lactaid).      levothyroxine (SYNTHROID) 112 MCG tablet Take 112 mcg by mouth daily.     Magnesium 200 MG TABS Take 200 mg by mouth daily.     Multiple Vitamin (MULTIVITAMIN WITH MINERALS) TABS Take 1 tablet by mouth daily.     NONFORMULARY OR COMPOUNDED ITEM Vitamin E vaginal cream 200u/ml.  Apply 65ml vaginally and small amount externally two to three times weekly. 36 each 3   timolol (TIMOPTIC) 0.5 % ophthalmic solution Place 1 drop into both eyes every morning.  3   amoxicillin (AMOXIL) 500 MG tablet Take 2 tablets by mouth twice a day for 10 days. (Patient not taking: Reported on 05/12/2021) 40 tablet 0   bismuth subsalicylate (PEPTO BISMOL) 262 MG chewable tablet Chew 524 mg by mouth as needed for diarrhea or loose stools. (Patient not taking: Reported on 05/12/2021)     bismuth-metronidazole-tetracycline (PYLERA) 140-125-125 MG capsule Take 3 capsules by mouth 4 (four) times daily -  before meals and at bedtime for 10 days. 120 capsule 0   clarithromycin (BIAXIN) 500 MG tablet Take 1 tablet by mouth twice a day for 10 days. (Patient not taking: Reported on 05/12/2021) 20 tablet 0   famotidine (PEPCID) 20 MG tablet Take 1 tablet (20 mg total)  by mouth daily. (Patient not taking: Reported on 05/12/2021) 30 tablet 1   omeprazole (PRILOSEC OTC) 20 MG tablet Take 1 tablet twice a day for 10 days. (Patient not taking: Reported on 05/12/2021) 20 tablet 0   saccharomyces boulardii (FLORASTOR) 250 MG capsule Take one tablet BID x 1 month (Patient not taking: Reported on 05/12/2021) 60 capsule 0   No current facility-administered medications for this visit.    Family History  Problem Relation Age of Onset   Hypertension Father    Diabetes Father    Dementia Father    Hyperlipidemia Father    Prostate cancer Father    Colon polyps Father    Parkinson's disease Mother    Dementia Mother    Hypertension Mother    Breast cancer Cousin        maternal side of family   Dementia Brother    Colon polyps Brother    Diabetes Brother    Seizures Brother    Heart disease Maternal Uncle    Diabetes Paternal Grandmother    Colon cancer Neg Hx    Esophageal cancer Neg Hx    Stomach cancer Neg Hx    Liver disease Neg Hx    Pancreatic cancer Neg Hx     Review of Systems  All other systems reviewed and are negative.  Exam:   BP (!) 157/75 (BP Location: Left Arm, Patient Position: Sitting, Cuff Size: Large)   Pulse 92   Ht 5' 5.5" (1.664 m) Comment: reported  Wt 192 lb 3.2 oz (87.2 kg)   LMP 07/06/2010   BMI 31.50 kg/m   Height: 5' 5.5" (166.4 cm) (reported)  General appearance: alert, cooperative and appears stated age Breasts: normal appearance, no masses or tenderness Abdomen: soft, non-tender; bowel sounds normal; no masses,  no organomegaly Lymph nodes: Cervical, supraclavicular, and axillary nodes normal.  No abnormal inguinal nodes palpated Neurologic: Grossly normal  Pelvic: External genitalia:  no lesions              Urethra:  normal appearing urethra with no masses, tenderness or lesions              Bartholins and Skenes: normal                 Vagina: normal appearing vagina with atrophic changes and no discharge, no  lesions              Cervix:  almost flush was apex              Pap taken: Yes.  Bimanual Exam:  Uterus:  normal size, contour, position, consistency, mobility, non-tender              Adnexa: normal adnexa and no mass, fullness, tenderness               Rectovaginal: Confirms               Anus:  normal sphincter tone, no lesions  Chaperone, Octaviano Batty, CMA, was present for exam.  Assessment/Plan: 1. GYN exam for high-risk Medicare patient - pap and HR HPV obtained today - colonoscopy 2020 - MMG 10/2020 - BMD 10/2020 - lab work done with Dr. Carol Ada last week - vaccine reviewed/updated  2. Postmenopausal - no HRT  3. CIN III (cervical intraepithelial neoplasia grade III) with severe dysplasia - Cytology - PAP( [place])  4. High risk HPV infection, h/o  5. H/O LEEP  6. Prediabetes  7. Osteopenia, unspecified location  8.  Vaginal atrophic changes - OTC products discussed

## 2021-05-12 NOTE — Patient Instructions (Signed)
Reveree--vaginal hyaluronic acid

## 2021-05-14 LAB — CYTOLOGY - PAP
Comment: NEGATIVE
Diagnosis: NEGATIVE
High risk HPV: NEGATIVE

## 2021-05-15 DIAGNOSIS — Z23 Encounter for immunization: Secondary | ICD-10-CM | POA: Diagnosis not present

## 2021-05-15 DIAGNOSIS — D229 Melanocytic nevi, unspecified: Secondary | ICD-10-CM | POA: Diagnosis not present

## 2021-05-15 DIAGNOSIS — L720 Epidermal cyst: Secondary | ICD-10-CM | POA: Diagnosis not present

## 2021-05-15 DIAGNOSIS — L814 Other melanin hyperpigmentation: Secondary | ICD-10-CM | POA: Diagnosis not present

## 2021-05-15 DIAGNOSIS — L739 Follicular disorder, unspecified: Secondary | ICD-10-CM | POA: Diagnosis not present

## 2021-05-15 DIAGNOSIS — Z808 Family history of malignant neoplasm of other organs or systems: Secondary | ICD-10-CM | POA: Diagnosis not present

## 2021-05-15 DIAGNOSIS — L570 Actinic keratosis: Secondary | ICD-10-CM | POA: Diagnosis not present

## 2021-05-15 DIAGNOSIS — L821 Other seborrheic keratosis: Secondary | ICD-10-CM | POA: Diagnosis not present

## 2021-05-15 DIAGNOSIS — L578 Other skin changes due to chronic exposure to nonionizing radiation: Secondary | ICD-10-CM | POA: Diagnosis not present

## 2021-05-26 ENCOUNTER — Encounter (HOSPITAL_BASED_OUTPATIENT_CLINIC_OR_DEPARTMENT_OTHER): Payer: Self-pay | Admitting: Obstetrics & Gynecology

## 2021-06-06 ENCOUNTER — Encounter (HOSPITAL_BASED_OUTPATIENT_CLINIC_OR_DEPARTMENT_OTHER): Payer: Self-pay | Admitting: Cardiovascular Disease

## 2021-06-09 NOTE — Telephone Encounter (Signed)
Please advise 

## 2021-06-10 DIAGNOSIS — H401112 Primary open-angle glaucoma, right eye, moderate stage: Secondary | ICD-10-CM | POA: Diagnosis not present

## 2021-06-10 DIAGNOSIS — H53431 Sector or arcuate defects, right eye: Secondary | ICD-10-CM | POA: Diagnosis not present

## 2021-06-10 DIAGNOSIS — H2513 Age-related nuclear cataract, bilateral: Secondary | ICD-10-CM | POA: Diagnosis not present

## 2021-06-10 DIAGNOSIS — H401121 Primary open-angle glaucoma, left eye, mild stage: Secondary | ICD-10-CM | POA: Diagnosis not present

## 2021-07-11 DIAGNOSIS — E7800 Pure hypercholesterolemia, unspecified: Secondary | ICD-10-CM | POA: Diagnosis not present

## 2021-07-11 DIAGNOSIS — I10 Essential (primary) hypertension: Secondary | ICD-10-CM | POA: Diagnosis not present

## 2021-07-11 DIAGNOSIS — E039 Hypothyroidism, unspecified: Secondary | ICD-10-CM | POA: Diagnosis not present

## 2021-07-30 ENCOUNTER — Telehealth: Payer: Self-pay | Admitting: *Deleted

## 2021-07-30 NOTE — Telephone Encounter (Signed)
LMVM- calling to schedule appointment for 24 hour ambulatory blood pressure monitor.  Test requested by Dr. Oval Linsey to be done after 08/23/21.  Test will need to be scheduled at our [facility] office.  Please call Jodi [name] in monitors at [phone] to schedule.

## 2021-08-19 ENCOUNTER — Telehealth: Payer: Self-pay | Admitting: Cardiovascular Disease

## 2021-08-19 ENCOUNTER — Other Ambulatory Visit (HOSPITAL_BASED_OUTPATIENT_CLINIC_OR_DEPARTMENT_OTHER): Payer: Self-pay | Admitting: Cardiovascular Disease

## 2021-08-19 DIAGNOSIS — I10 Essential (primary) hypertension: Secondary | ICD-10-CM

## 2021-08-19 DIAGNOSIS — R030 Elevated blood-pressure reading, without diagnosis of hypertension: Secondary | ICD-10-CM

## 2021-08-19 NOTE — Telephone Encounter (Signed)
Transferred call to Markus Daft

## 2021-08-26 NOTE — Progress Notes (Signed)
Cardiology Office Note   Date:  08/27/2021   ID:  Jodi [name], DOB 02-May-1951, MRN [id number]  PCP:  Carol Ada, MD  Cardiologist:   Skeet Latch, MD   No chief complaint on file.   History of Present Illness: Jodi [name] is a 71 y.o. female with OSA not on CPAP, hypertension and anxiety here for follow up.  She was initially seen for the evaluation of palpitations and shortness of breath.  In early September she was talking to a neighbor and when she stood up she felt like her heart rate stopped and then she felt presyncopal but didn't pass out.  She reports four similar episodes.  She went to the ED each time.  The first time her potassium was noted to be low so she was given a potassium supplement.  Her potassium was 2.9.  She reports being on HCTZ for years.  She notes an occasional fluttering.  She also feels short of breath at times when it occurs.  She didn't feel it while wearing the monitor at the hospital.  She notes that her heart beats hard when she tries to exert herself.  She likes to walk a lot for exercise.  However now, even with carrying laundry up and down steps she is more short of breath than usual.  The symptoms mostly improved after she started potassium.  She continues to have lightheadedness if she bent over.  She saw her PCP and her BP was elevated so she was started on olmesartan.  However she thinks that she has white coat syndrome.  She noted vertigo so she stopped taking it.  She notes that she worries a lot about her health and gets anxious thinking about it.  She hears her heat when trying to sleep.  It seems to be regular. She has no edema, orthopnea or PND.    [name] had orthostatic symptoms and palpitations.  Her potassium was also low.  She we switched from HCTZ to amlodipine.  She stopped amlodipine because it irritated her IBS.  She tried 1 dose of carvedilol and stopped it because it made her very sleepy.  She had an ETT 04/2020 that was  negative. She achieved 7 METs on a Bruce protocol. She also wore an ambulatory monitor that revealed PACs and sinus tachcyardia that corresponded to episodes of heart fluttering. It was recommeded that she start carvedilol but felt fatigued. She has OSA and has been off her CPAP machine for at least three years. She had a repeat sleep study and was negative for OSA. She was referred to PREP program through the [facility].   She reported atypical chest pain and noted that her anxiety was poorly controlled. She wore a 24-hour blood pressure monitor that showed her blood pressure was markedly elevated the first hour of wearing it and averaged 158/91. Her 24-hour average was 138/75.  At her last appointment, she was doing well. Since starting the PREP program, her palpitations became less frequent. However, in 01/2021, her exercise and diet suffered due to several medical issues including an infection with  H. pylori. She was getting back into exercise at the [facility] and following a therapist for anxiety.  Today, she is doing well. About a month ago, her palpitations returned. However, they are slower than her previous palpitation episodes. These palpitations do not cause her to pause. She notices them primarily after caffeine or eating. She had cut out caffeine entirely then reintroduced it into her  diet. She believes she is more sensitive to caffeine. The episodes are not constant and are brief. Her LE edema has improved since drinking more water and staying active. She finds the palpitations bothersome but not worrying. She has gained weight recently despite not changing her diet and exercising more. Her clothes still fit normally. Her son told her the weight is likely muscle mass. She attends the PREP program at the [facility] 2 to 3 time a week and enjoys it. At home, she will use hand weights and run up her stairs. She hurt her L arm at the [facility] and was unable to do the monitor. She does intend to wear the monitor. The ache  and tenderness has radiated to her central chest but is not as bad. Her eye doctor found a burst blood vessel behind her eye. He mentioned it may be caused by high blood pressure or lifting weights. She does not check her blood pressure at home. For diet, she tries to eat mostly vegetables. However, she enjoys breads and sugars like chocolate. She intends to try the Noom diet plan for the next 2 weeks.  Her 10-year risk is 10.6%. She denies any or shortness of breath, lightheadedness, headaches, syncope, orthopnea, PND, or exertional symptoms.  Past Medical History:  Diagnosis Date   Abnormal Pap smear of cervix    Anxiety    Aortic atherosclerosis ([place])    Atypical chest pain 10/08/2020   Symptoms are very atypical.  She feels good with exercise and notes that in general she is feeling better since she started the exercise program.  She had a normal stress test in 2021.  No repeat ischemia testing advised.  She does also report difficulty and pain with swallowing with referred pain into her back.  She has GI follow-up.   Diverticulitis    Fatty liver disease, nonalcoholic    Glaucoma    H. pylori infection    H/O dizziness    Hemorrhoids    History of depression    incest survivor/dysthymia   Hypertension    Hyperthyroidism    Hypokalemia 04/29/2020   IBS (irritable bowel syndrome)    lactose intolerance   Palpitations 04/29/2020   Restless leg syndrome    Sleep apnea    Snoring 11/17/2012   Thyroid disease    hypothyroid   Varicose veins     Past Surgical History:  Procedure Laterality Date   CHOLECYSTECTOMY  07/19/2017   Dr. Donne Hazel   COLPOSCOPY  99, 00, 02, 04, 06   ENDOVENOUS ABLATION SAPHENOUS VEIN W/ LASER Right 04-08-2015   endovenous laser ablation (right greater saphenous vein) by Victorino Dike MD     LEEP  3/09   CIN II/III     Current Outpatient Medications  Medication Sig Dispense Refill   albuterol (VENTOLIN HFA) 108 (90 Base) MCG/ACT inhaler      ALPRAZolam  (XANAX) 0.25 MG tablet Take 0.125 mg by mouth every 6 (six) hours as needed for anxiety.     EPINEPHrine 0.3 mg/0.3 mL IJ SOAJ injection Inject 0.3 mg into the muscle as needed for anaphylaxis.  0   fluticasone (FLONASE) 50 MCG/ACT nasal spray Place 2 sprays into both nostrils daily as needed for allergies.      lactase (LACTAID) 3000 units tablet Take 3,000 Units by mouth as needed (lactaid).      levothyroxine (SYNTHROID) 112 MCG tablet Take 112 mcg by mouth daily.     Magnesium 200 MG TABS Take 200 mg by  mouth daily.     Multiple Vitamin (MULTIVITAMIN WITH MINERALS) TABS Take 1 tablet by mouth daily.     saccharomyces boulardii (FLORASTOR) 250 MG capsule Take one tablet BID x 1 month 60 capsule 0   timolol (TIMOPTIC) 0.5 % ophthalmic solution Place 1 drop into both eyes every morning.  3   No current facility-administered medications for this visit.    Allergies:   Sulfites, Amlodipine, Iodine, Macrodantin [nitrofurantoin macrocrystal], Serotonin reuptake inhibitors (ssris), Shellfish allergy, Venlafaxine, Almond oil, and Bentyl [dicyclomine hcl]    Social History:  The patient  reports that she quit smoking about 42 years ago. Her smoking use included cigarettes. She has never used smokeless tobacco. She reports that she does not currently use alcohol. She reports that she does not use drugs.   Family History:  The patient's family history includes Breast cancer in her cousin; Colon polyps in her brother and father; Dementia in her brother, father, and mother; Diabetes in her brother, father, and paternal grandmother; Heart disease in her maternal uncle; Hyperlipidemia in her father; Hypertension in her father and mother; Parkinson's disease in her mother; Prostate cancer in her father; Seizures in her brother.    ROS:   Please see the history of present illness. (+) Palpitations (+) Weight gain (+) L arm pain/tenderness (+) Chest pain All other systems are reviewed and negative.    PHYSICAL EXAM: VS:  BP (!) 148/74 (BP Location: Left Arm, Patient Position: Sitting, Cuff Size: Large)    Pulse 86    Ht 5' 5.5" (1.664 m)    Wt 195 lb 14.4 oz (88.9 kg)    LMP 07/06/2010    BMI 32.10 kg/m  , BMI Body mass index is 32.1 kg/m. GENERAL:  Well appearing HEENT: Pupils equal round and reactive, fundi not visualized, oral mucosa unremarkable NECK:  No jugular venous distention, waveform within normal limits, carotid upstroke brisk and symmetric, no bruits LUNGS:  Clear to auscultation bilaterally HEART:  RRR.  PMI not displaced or sustained,S1 and S2 within normal limits, no S3, no S4, no clicks, no rubs, no murmurs ABD:  Flat, positive bowel sounds normal in frequency in pitch, no bruits, no rebound, no guarding, no midline pulsatile mass, no hepatomegaly, no splenomegaly EXT:  2 plus pulses throughout, no edema, no cyanosis no clubbing SKIN:  No rashes no nodules NEURO:  Cranial nerves II through XII grossly intact, motor grossly intact throughout PSYCH:  Cognitively intact, oriented to person place and time  EKG:   08/27/21: Sinus rhythm, rate 86 bpm 02/20/2021: Sinus rhythm. Rate 83 bpm. 04/23/2020: Sinus rhythm.  Rate 96 bpm.  Low voltage precordial leads.  Blood Pressure Monitor 10/21/2020: 24 Hour Ambulatory BP Report   24 Hour Average BP: 138/75, HR 82 Average Daily BP : 141/77, HR  82 Average Night BP: 129/66, HR 78 Percentage Nightly Change: -7.9% in SBP, -14.2% DBP White Coat Period (1st Hour): Average BP 158/91, HR 101 Overall BP Range: 100-172/56-100   Interpretation:  Stage I Hypertension with superimposed White Coat Hypertension  7 Day Event Monitor 05/2020: Quality: Fair.  Baseline artifact. Predominant rhythm: sinus rhythm Average heart rate: 85 bpm Max heart rate: 140 bpm Min heart rate: 58 bpm Pauses >2.5 seconds: none Occasional PACs 8 beat run of atrial tachycardia   Patient reported heart fluttering. At these times, sinus rhythm, PACs and  sinus tachycardia were noted.  ETT 05/01/20: Upsloping ST segment depression ST segment depression was noted during stress in the II, III,  aVF, V6, V5, V4 and V3 leads. Blood pressure demonstrated a normal response to exercise. Negative, adequate stress test.  CTA Chest 04/23/20 IMPRESSION: 1. Negative for pulmonary embolus. 2. Lungs are clear. 3. Aortic atherosclerosis. (ICD10-I70.0).  Recent Labs: No results found for requested labs within last 8760 hours.    Lipid Panel No results found for: CHOL, TRIG, HDL, CHOLHDL, VLDL, LDLCALC, LDLDIRECT    Wt Readings from Last 3 Encounters:  08/27/21 195 lb 14.4 oz (88.9 kg)  05/12/21 192 lb 3.2 oz (87.2 kg)  02/20/21 186 lb 3.2 oz (84.5 kg)      ASSESSMENT AND PLAN: Essential (primary) hypertension Blood pressure was elevated today both initially and on repeat.  It did improve on repeat.  We know that she has whitecoat hypertension and at home her blood pressures are actually reasonably controlled. She is due to repeat her 24-hour ambulatory blood pressure monitor.  Keep working on diet and exercise.  Obstructive sleep apnea syndrome She has not tolerated CPAP.  Pure hypercholesterolemia ASCVD 10-year risk was 10%.  We discussed the fact that at this level statins are recommended.  She wants to keep working on diet and exercise.  Overall her diet has been fairly healthy.  She does not eat much red meat or fatty foods.  She will try to limit the cheese in her diet.  Repeat lipids and a CMP in 3 months.  If her lipids remain elevated we will need to consider statins at that time.   Current medicines are reviewed at length with the patient today.  The patient does not have concerns regarding medicines.  The following changes have been made: none   Labs/ tests ordered today include:   Orders Placed This Encounter  Procedures   TSH   Lipid panel   Comprehensive metabolic panel   EKG 26-EBRA    Disposition: FU with Madilyn Cephas C.  Oval Linsey, MD, [facility] in 3-4 months  I,Mykaella Javier,acting as a scribe for Skeet Latch, MD.,have documented all relevant documentation on the behalf of Skeet Latch, MD,as directed by  Skeet Latch, MD while in the presence of Skeet Latch, MD.  I, [place] Oval Linsey, MD have reviewed all documentation for this visit.  The documentation of the exam, diagnosis, procedures, and orders on 08/27/2021 are all accurate and complete.   Signed, Jasson Siegmann C. Oval Linsey, MD, [facility]  08/27/2021 1:00 PM    [facility]

## 2021-08-27 ENCOUNTER — Other Ambulatory Visit: Payer: Self-pay

## 2021-08-27 ENCOUNTER — Telehealth: Payer: Self-pay | Admitting: *Deleted

## 2021-08-27 ENCOUNTER — Encounter (HOSPITAL_BASED_OUTPATIENT_CLINIC_OR_DEPARTMENT_OTHER): Payer: Self-pay | Admitting: Cardiovascular Disease

## 2021-08-27 ENCOUNTER — Ambulatory Visit (HOSPITAL_BASED_OUTPATIENT_CLINIC_OR_DEPARTMENT_OTHER): Payer: Medicare Other | Admitting: Cardiovascular Disease

## 2021-08-27 DIAGNOSIS — G4733 Obstructive sleep apnea (adult) (pediatric): Secondary | ICD-10-CM

## 2021-08-27 DIAGNOSIS — I10 Essential (primary) hypertension: Secondary | ICD-10-CM

## 2021-08-27 DIAGNOSIS — E7800 Pure hypercholesterolemia, unspecified: Secondary | ICD-10-CM

## 2021-08-27 NOTE — Telephone Encounter (Signed)
Pt reaching out to set up monitor appt... please advise

## 2021-08-27 NOTE — Assessment & Plan Note (Signed)
Blood pressure was elevated today both initially and on repeat.  It did improve on repeat.  We know that she has whitecoat hypertension and at home her blood pressures are actually reasonably controlled. She is due to repeat her 24-hour ambulatory blood pressure monitor.  Keep working on diet and exercise.

## 2021-08-27 NOTE — Telephone Encounter (Signed)
Appointment scheduled.

## 2021-08-27 NOTE — Patient Instructions (Signed)
Medication Instructions:  Your physician recommends that you continue on your current medications as directed. Please refer to the Current Medication list given to you today.   *If you need a refill on your cardiac medications before your next appointment, please call your pharmacy*   Lab Work: TSH TODAY   FASTING LP/CMET IN 3 MONTHS FEW DAYS PRIOR TO FOLLOW UP   If you have labs (blood work) drawn today and your tests are completely normal, you will receive your results only by: [place] (if you have MyChart) OR A paper copy in the mail If you have any lab test that is abnormal or we need to change your treatment, we will call you to review the results.   Testing/Procedures: 24 HOUR BLOOD PRESSURE MONITORING   Follow-Up: At [facility], you and your health needs are our priority.  As part of our continuing mission to provide you with exceptional heart care, we have created designated Provider Care Teams.  These Care Teams include your primary Cardiologist (physician) and Advanced Practice Providers (APPs -  Physician Assistants and Nurse Practitioners) who all work together to provide you with the care you need, when you need it.  We recommend signing up for the patient portal called "MyChart".  Sign up information is provided on this After Visit Summary.  MyChart is used to connect with patients for Virtual Visits (Telemedicine).  Patients are able to view lab/test results, encounter notes, upcoming appointments, etc.  Non-urgent messages can be sent to your provider as well.   To learn more about what you can do with MyChart, go to [email or web address].    Your next appointment:   AFTER LABS IN 3 month(s)  The format for your next appointment:   In Person  Provider:   Skeet Latch, MD

## 2021-08-27 NOTE — Assessment & Plan Note (Signed)
She has not tolerated CPAP.

## 2021-08-27 NOTE — Assessment & Plan Note (Signed)
ASCVD 10-year risk was 10%.  We discussed the fact that at this level statins are recommended.  She wants to keep working on diet and exercise.  Overall her diet has been fairly healthy.  She does not eat much red meat or fatty foods.  She will try to limit the cheese in her diet.  Repeat lipids and a CMP in 3 months.  If her lipids remain elevated we will need to consider statins at that time.

## 2021-08-28 LAB — TSH: TSH: 3 u[IU]/mL (ref 0.450–4.500)

## 2021-09-01 ENCOUNTER — Ambulatory Visit (INDEPENDENT_AMBULATORY_CARE_PROVIDER_SITE_OTHER): Payer: Medicare Other

## 2021-09-01 ENCOUNTER — Other Ambulatory Visit: Payer: Self-pay

## 2021-09-01 DIAGNOSIS — R030 Elevated blood-pressure reading, without diagnosis of hypertension: Secondary | ICD-10-CM | POA: Diagnosis not present

## 2021-09-01 DIAGNOSIS — I10 Essential (primary) hypertension: Secondary | ICD-10-CM

## 2021-09-01 NOTE — Progress Notes (Unsigned)
24 Hour ambulatory blood pressure monitor applied using standard adult cuff.

## 2021-09-16 NOTE — Telephone Encounter (Signed)
Patient saw Dr Oval Linsey 08/2021 ?

## 2021-10-06 DIAGNOSIS — Z1231 Encounter for screening mammogram for malignant neoplasm of breast: Secondary | ICD-10-CM | POA: Diagnosis not present

## 2021-10-08 ENCOUNTER — Telehealth (HOSPITAL_BASED_OUTPATIENT_CLINIC_OR_DEPARTMENT_OTHER): Payer: Self-pay | Admitting: *Deleted

## 2021-10-08 ENCOUNTER — Encounter (HOSPITAL_BASED_OUTPATIENT_CLINIC_OR_DEPARTMENT_OTHER): Payer: Self-pay | Admitting: Cardiovascular Disease

## 2021-10-08 NOTE — Telephone Encounter (Signed)
[place] Setting ?to [facility] Triage (supporting Skeet Latch, MD)   ?   9:50 AM ?Hi ?In Feb , I wore a 24 hr BP monitor. I have not been notified as to the results. ?Thank you ?Jodi [name] ?[birth year] ?[phone] ? ?Message received in [place], will forward to Dr Oval Linsey for review  ?

## 2021-10-09 ENCOUNTER — Telehealth (HOSPITAL_BASED_OUTPATIENT_CLINIC_OR_DEPARTMENT_OTHER): Payer: Self-pay | Admitting: Cardiovascular Disease

## 2021-10-09 ENCOUNTER — Encounter (HOSPITAL_BASED_OUTPATIENT_CLINIC_OR_DEPARTMENT_OTHER): Payer: Self-pay | Admitting: *Deleted

## 2021-10-09 ENCOUNTER — Encounter (HOSPITAL_BASED_OUTPATIENT_CLINIC_OR_DEPARTMENT_OTHER): Payer: Self-pay

## 2021-10-09 DIAGNOSIS — I10 Essential (primary) hypertension: Secondary | ICD-10-CM

## 2021-10-09 NOTE — Telephone Encounter (Signed)
My chart message sent to pt regarding results

## 2021-10-09 NOTE — Telephone Encounter (Signed)
Mychart message sent to patient regarding results and released results with Dr Blenda Mounts comments  ? ?

## 2021-10-09 NOTE — Telephone Encounter (Signed)
Mychart message sent to patient regarding results and released results with Dr Blenda Mounts comments  ?

## 2021-10-09 NOTE — Telephone Encounter (Signed)
Patient wanted to know the results from her 24 hr BP test  ?

## 2021-10-09 NOTE — Telephone Encounter (Signed)
Responded to patient via my chart message.

## 2021-10-10 MED ORDER — SPIRONOLACTONE 25 MG PO TABS: 12.5000 mg | ORAL_TABLET | Freq: Every day | ORAL | 3 refills | Status: DC

## 2021-10-13 DIAGNOSIS — H53431 Sector or arcuate defects, right eye: Secondary | ICD-10-CM | POA: Diagnosis not present

## 2021-10-13 DIAGNOSIS — H401121 Primary open-angle glaucoma, left eye, mild stage: Secondary | ICD-10-CM | POA: Diagnosis not present

## 2021-10-13 DIAGNOSIS — H401112 Primary open-angle glaucoma, right eye, moderate stage: Secondary | ICD-10-CM | POA: Diagnosis not present

## 2021-10-21 ENCOUNTER — Encounter: Payer: Self-pay | Admitting: Cardiovascular Disease

## 2021-11-12 DIAGNOSIS — E7800 Pure hypercholesterolemia, unspecified: Secondary | ICD-10-CM | POA: Diagnosis not present

## 2021-11-12 DIAGNOSIS — G2581 Restless legs syndrome: Secondary | ICD-10-CM | POA: Diagnosis not present

## 2021-11-12 DIAGNOSIS — Z0000 Encounter for general adult medical examination without abnormal findings: Secondary | ICD-10-CM | POA: Diagnosis not present

## 2021-11-12 DIAGNOSIS — E039 Hypothyroidism, unspecified: Secondary | ICD-10-CM | POA: Diagnosis not present

## 2021-11-12 DIAGNOSIS — I700 Atherosclerosis of aorta: Secondary | ICD-10-CM | POA: Diagnosis not present

## 2021-11-12 DIAGNOSIS — R7303 Prediabetes: Secondary | ICD-10-CM | POA: Diagnosis not present

## 2021-11-18 DIAGNOSIS — E7800 Pure hypercholesterolemia, unspecified: Secondary | ICD-10-CM | POA: Diagnosis not present

## 2021-11-18 DIAGNOSIS — I10 Essential (primary) hypertension: Secondary | ICD-10-CM | POA: Diagnosis not present

## 2021-11-19 ENCOUNTER — Encounter (HOSPITAL_BASED_OUTPATIENT_CLINIC_OR_DEPARTMENT_OTHER): Payer: Self-pay | Admitting: Cardiovascular Disease

## 2021-11-19 ENCOUNTER — Ambulatory Visit (HOSPITAL_BASED_OUTPATIENT_CLINIC_OR_DEPARTMENT_OTHER): Payer: Medicare Other | Admitting: Cardiovascular Disease

## 2021-11-19 VITALS — BP 134/78 | HR 92 | Ht 65.5 in | Wt 201.9 lb

## 2021-11-19 DIAGNOSIS — Z5181 Encounter for therapeutic drug level monitoring: Secondary | ICD-10-CM | POA: Diagnosis not present

## 2021-11-19 DIAGNOSIS — I10 Essential (primary) hypertension: Secondary | ICD-10-CM

## 2021-11-19 DIAGNOSIS — E7800 Pure hypercholesterolemia, unspecified: Secondary | ICD-10-CM

## 2021-11-19 DIAGNOSIS — R600 Localized edema: Secondary | ICD-10-CM | POA: Diagnosis not present

## 2021-11-19 DIAGNOSIS — G4733 Obstructive sleep apnea (adult) (pediatric): Secondary | ICD-10-CM | POA: Diagnosis not present

## 2021-11-19 DIAGNOSIS — Z6833 Body mass index (BMI) 33.0-33.9, adult: Secondary | ICD-10-CM

## 2021-11-19 LAB — BASIC METABOLIC PANEL
BUN/Creatinine Ratio: 11 — ABNORMAL LOW (ref 12–28)
BUN: 7 mg/dL — ABNORMAL LOW (ref 8–27)
CO2: 22 mmol/L (ref 20–29)
Calcium: 9.1 mg/dL (ref 8.7–10.3)
Chloride: 107 mmol/L — ABNORMAL HIGH (ref 96–106)
Creatinine, Ser: 0.65 mg/dL (ref 0.57–1.00)
Glucose: 120 mg/dL — ABNORMAL HIGH (ref 70–99)
Potassium: 4.3 mmol/L (ref 3.5–5.2)
Sodium: 143 mmol/L (ref 134–144)
eGFR: 95 mL/min/{1.73_m2} (ref >59)

## 2021-11-19 LAB — COMPREHENSIVE METABOLIC PANEL
ALT: 21 IU/L (ref 0–32)
AST: 20 IU/L (ref 0–40)
Albumin/Globulin Ratio: 1.9 (ref 1.2–2.2)
Albumin: 4.2 g/dL (ref 3.8–4.8)
Alkaline Phosphatase: 64 IU/L (ref 44–121)
BUN/Creatinine Ratio: 10 — ABNORMAL LOW (ref 12–28)
BUN: 7 mg/dL — ABNORMAL LOW (ref 8–27)
Bilirubin Total: 0.5 mg/dL (ref 0.0–1.2)
CO2: 23 mmol/L (ref 20–29)
Calcium: 8.8 mg/dL (ref 8.7–10.3)
Chloride: 107 mmol/L — ABNORMAL HIGH (ref 96–106)
Creatinine, Ser: 0.73 mg/dL (ref 0.57–1.00)
Globulin, Total: 2.2 g/dL (ref 1.5–4.5)
Glucose: 121 mg/dL — ABNORMAL HIGH (ref 70–99)
Potassium: 4.3 mmol/L (ref 3.5–5.2)
Sodium: 144 mmol/L (ref 134–144)
Total Protein: 6.4 g/dL (ref 6.0–8.5)
eGFR: 88 mL/min/{1.73_m2} (ref >59)

## 2021-11-19 LAB — LIPID PANEL
Chol/HDL Ratio: 3.8 ratio (ref 0.0–4.4)
Cholesterol, Total: 218 mg/dL — ABNORMAL HIGH (ref 100–199)
HDL: 58 mg/dL (ref >39)
LDL Chol Calc (NIH): 135 mg/dL — ABNORMAL HIGH (ref 0–99)
Triglycerides: 139 mg/dL (ref 0–149)
VLDL Cholesterol Cal: 25 mg/dL (ref 5–40)

## 2021-11-19 MED ORDER — ROSUVASTATIN CALCIUM 10 MG PO TABS: 10.0000 mg | ORAL_TABLET | Freq: Every day | ORAL | 3 refills | Status: DC

## 2021-11-19 NOTE — Assessment & Plan Note (Addendum)
She had aortic atherosclerosis on prior CT scan.  Given this, her LDL goal is less than 70.  She is going to keep working on her exercise and work on her diet.  We will also start rosuvastatin 10 mg daily.  Repeat lipids and a CMP in 2 to 3 months. ?

## 2021-11-19 NOTE — Assessment & Plan Note (Signed)
BP is stable with stage 1 HTN.  She developed hypokalemia on hydrochlorothiazide.  She will prefer to keep working on diet and exercise and not start any new medicines.  If she does need medicines in the future, would consider spironolactone.  She decided not to start taking at this time. ?

## 2021-11-19 NOTE — Addendum Note (Signed)
Addended by: Alvina Filbert B on: 11/19/2021 11:08 AM ? ? Modules accepted: Orders ? ?

## 2021-11-19 NOTE — Patient Instructions (Signed)
Medication Instructions:  ?START ROSUVASTATIN 10 MG DAILY  ? ?*If you need a refill on your cardiac medications before your next appointment, please call your pharmacy* ? ?Lab Work: ?FASTING LP/CMET AROUND July 4TH  ? ?If you have labs (blood work) drawn today and your tests are completely normal, you will receive your results only by: ?MyChart Message (if you have MyChart) OR ?A paper copy in the mail ?If you have any lab test that is abnormal or we need to change your treatment, we will call you to review the results. ? ?Testing/Procedures: ?Your physician has requested that you have an echocardiogram. Echocardiography is a painless test that uses sound waves to create images of your heart. It provides your doctor with information about the size and shape of your heart and how well your heart?s chambers and valves are working. This procedure takes approximately one hour. There are no restrictions for this procedure. ? ?Follow-Up: ?At [facility], you and your health needs are our priority.  As part of our continuing mission to provide you with exceptional heart care, we have created designated Provider Care Teams.  These Care Teams include your primary Cardiologist (physician) and Advanced Practice Providers (APPs -  Physician Assistants and Nurse Practitioners) who all work together to provide you with the care you need, when you need it. ? ?We recommend signing up for the patient portal called "MyChart".  Sign up information is provided on this After Visit Summary.  MyChart is used to connect with patients for Virtual Visits (Telemedicine).  Patients are able to view lab/test results, encounter notes, upcoming appointments, etc.  Non-urgent messages can be sent to your provider as well.   ?To learn more about what you can do with MyChart, go to [email or web address].   ? ?Your next appointment:   ?6 month(s) ? ?The format for your next appointment:   ?In Person ? ?Provider:   ?Skeet Latch,  MD{ ? ? ? ? ? ? ? ?

## 2021-11-19 NOTE — Progress Notes (Signed)
? ? ?Cardiology Office Note ? ? ?Date:  11/19/2021  ? ?ID:  Jodi [name], DOB May 13, 1951, MRN [id number] ? ?PCP:  Carol Ada, MD  ?Cardiologist:   Skeet Latch, MD  ? ?No chief complaint on file. ? ? ?History of Present Illness: ?Jodi [name] is a 71 y.o. female with OSA not on CPAP, hypertension and anxiety here for follow up.  She was initially seen for the evaluation of palpitations and shortness of breath.  In early September she was talking to a neighbor and when she stood up she felt like her heart rate stopped and then she felt presyncopal but didn't pass out.  She reports four similar episodes.  She went to the ED each time.  The first time her potassium was noted to be low so she was given a potassium supplement.  Her potassium was 2.9.  She reports being on HCTZ for years.  She notes an occasional fluttering.  She also feels short of breath at times when it occurs.  She didn't feel it while wearing the monitor at the hospital.  She notes that her heart beats hard when she tries to exert herself.  She likes to walk a lot for exercise.  However now, even with carrying laundry up and down steps she is more short of breath than usual.  The symptoms mostly improved after she started potassium.  She continues to have lightheadedness if she bent over.  She saw her PCP and her BP was elevated so she was started on olmesartan.  However she thinks that she has white coat syndrome.  She noted vertigo so she stopped taking it.  She notes that she worries a lot about her health and gets anxious thinking about it.  She hears her heat when trying to sleep.  It seems to be regular. She has no edema, orthopnea or PND.   ? ?Jodi [name] had orthostatic symptoms and palpitations.  Her potassium was also low.  She we switched from HCTZ to amlodipine.  She stopped amlodipine because it irritated her IBS.  She tried 1 dose of carvedilol and stopped it because it made her very sleepy.  She had an ETT 04/2020 that was  negative. She achieved 7 METs on a Bruce protocol. She also wore an ambulatory monitor that revealed PACs and sinus tachcyardia that corresponded to episodes of heart fluttering. It was recommeded that she start carvedilol but felt fatigued. She has OSA and has been off her CPAP machine for at least three years. She had a repeat sleep study and was negative for OSA. She was referred to PREP program through the [facility].  ? ?She reported atypical chest pain and noted that her anxiety was poorly controlled. She wore a 24-hour blood pressure monitor that showed her blood pressure was markedly elevated the first hour of wearing it and averaged 158/91. Her 24-hour average was 138/75.  At her last appointment, she was doing well. Since starting the PREP program, her palpitations became less frequent. However, in 01/2021, her exercise and diet suffered due to several medical issues including an infection with  H. pylori. She was getting back into exercise at the [facility] and following a therapist for anxiety. ? ?At her last appointment she was generally well but had been struggling with some palpitations.  She was participating in the prep program at the [facility] and enjoyed it.  She was also working on her diet and plan to try Noom. ?She repeated her 24-hour blood pressure  monitor and it was essentially unchanged from prior.  Given her hypokalemia we recommended switching HCTZ to spironolactone.  Average blood pressure was 138/77.  She was not able to tolerate CPAP.  She started taking a beet supplement.  She hasn't been taking any BP medication.  She continues to exercise regularly and has no chest pain or shortness of breath.  She is frustrated that she can't seem to lose weight.  When she lost weight she no longer needed a  CPAP.  She wants to focus on weight loss, as she doesn't tolerate the CPAP mahcine.  She has LE edema that is worse at the end of the day.  She previously had the veins in her legs stripped.  She no longer uses  compression stockings.  She has no orthopnea or PND.  She notes that her anxiety has been much better controlled lately. ? ? ?Past Medical History:  ?Diagnosis Date  ? Abnormal Pap smear of cervix   ? Anxiety   ? Aortic atherosclerosis ([place])   ? Atypical chest pain 10/08/2020  ? Symptoms are very atypical.  She feels good with exercise and notes that in general she is feeling better since she started the exercise program.  She had a normal stress test in 2021.  No repeat ischemia testing advised.  She does also report difficulty and pain with swallowing with referred pain into her back.  She has GI follow-up.  ? Diverticulitis   ? Fatty liver disease, nonalcoholic   ? Glaucoma   ? H. pylori infection   ? H/O dizziness   ? Hemorrhoids   ? History of depression   ? incest survivor/dysthymia  ? Hypertension   ? Hyperthyroidism   ? Hypokalemia 04/29/2020  ? IBS (irritable bowel syndrome)   ? lactose intolerance  ? OSA (obstructive sleep apnea) 11/17/2012  ? Palpitations 04/29/2020  ? Restless leg syndrome   ? Sleep apnea   ? Snoring 11/17/2012  ? Thyroid disease   ? hypothyroid  ? Varicose veins   ? ? ?Past Surgical History:  ?Procedure Laterality Date  ? CHOLECYSTECTOMY  07/19/2017  ? Dr. Donne Hazel  ? COLPOSCOPY  99, 00, 02, 04, 06  ? ENDOVENOUS ABLATION SAPHENOUS VEIN W/ LASER Right 04-08-2015  ? endovenous laser ablation (right greater saphenous vein) by Victorino Dike MD    ? LEEP  3/09  ? CIN II/III  ? ? ? ?Current Outpatient Medications  ?Medication Sig Dispense Refill  ? ALPRAZolam (XANAX) 0.25 MG tablet Take 0.125 mg by mouth every 6 (six) hours as needed for anxiety.    ? EPINEPHrine 0.3 mg/0.3 mL IJ SOAJ injection Inject 0.3 mg into the muscle as needed for anaphylaxis.  0  ? fluticasone (FLONASE) 50 MCG/ACT nasal spray Place 2 sprays into both nostrils daily as needed for allergies.     ? lactase (LACTAID) 3000 units tablet Take 3,000 Units by mouth as needed (lactaid).     ? levothyroxine (SYNTHROID) 112 MCG tablet  Take 112 mcg by mouth daily.    ? Magnesium 200 MG TABS Take 200 mg by mouth daily.    ? Multiple Vitamin (MULTIVITAMIN WITH MINERALS) TABS Take 1 tablet by mouth daily.    ? rosuvastatin (CRESTOR) 10 MG tablet Take 1 tablet (10 mg total) by mouth daily. 90 tablet 3  ? saccharomyces boulardii (FLORASTOR) 250 MG capsule Take one tablet BID x 1 month 60 capsule 0  ? timolol (TIMOPTIC) 0.5 % ophthalmic solution Place 1 drop into  both eyes every morning.  3  ? ?No current facility-administered medications for this visit.  ? ? ?Allergies:   Sulfites, Amlodipine, Iodine, Macrodantin [nitrofurantoin macrocrystal], Serotonin reuptake inhibitors (ssris), Shellfish allergy, Venlafaxine, Almond oil, and Bentyl [dicyclomine hcl]  ? ? ?Social History:  The patient  reports that she quit smoking about 43 years ago. Her smoking use included cigarettes. She has never used smokeless tobacco. She reports that she does not currently use alcohol. She reports that she does not use drugs.  ? ?Family History:  The patient's family history includes Breast cancer in her cousin; Colon polyps in her brother and father; Dementia in her brother, father, and mother; Diabetes in her brother, father, and paternal grandmother; Heart disease in her maternal uncle; Hyperlipidemia in her father; Hypertension in her father and mother; Parkinson's disease in her mother; Prostate cancer in her father; Seizures in her brother.  ? ? ?ROS:   ?Please see the history of present illness. ?(+) Palpitations ?(+) Weight gain ?(+) L arm pain/tenderness ?(+) Chest pain ?All other systems are reviewed and negative.  ? ?PHYSICAL EXAM: ?VS:  BP 134/78 (BP Location: Left Arm, Patient Position: Sitting, Cuff Size: Large)   Pulse 92   Ht 5' 5.5" (1.664 m)   Wt 201 lb 14.4 oz (91.6 kg)   LMP 07/06/2010   SpO2 95%   BMI 33.09 kg/m?  , BMI Body mass index is 33.09 kg/m?. ?GENERAL:  Well appearing ?HEENT: Pupils equal round and reactive, fundi not visualized, oral  mucosa unremarkable ?NECK:  No jugular venous distention, waveform within normal limits, carotid upstroke brisk and symmetric, no bruits ?LUNGS:  Clear to auscultation bilaterally ?HEART:  RRR.  PMI not displaced

## 2021-11-19 NOTE — Assessment & Plan Note (Signed)
Sister with lower extremity edema that seems to be getting worse.  I suspect this is due to venous insufficiency.  However we will get an echo to establish a baseline to make sure that there is no evidence of heart failure. ?

## 2021-11-19 NOTE — Assessment & Plan Note (Signed)
She has not been using her CPAP.  She notes that when she loses weight she no longer has OSA.  She is going to work on continue her exercise and we will refer her to healthy weight and wellness to help with nutrition. ?

## 2021-11-20 ENCOUNTER — Encounter (HOSPITAL_BASED_OUTPATIENT_CLINIC_OR_DEPARTMENT_OTHER): Payer: Self-pay

## 2021-11-21 ENCOUNTER — Encounter (HOSPITAL_BASED_OUTPATIENT_CLINIC_OR_DEPARTMENT_OTHER): Payer: Self-pay | Admitting: Cardiovascular Disease

## 2021-11-21 DIAGNOSIS — I10 Essential (primary) hypertension: Secondary | ICD-10-CM

## 2021-11-21 DIAGNOSIS — E7800 Pure hypercholesterolemia, unspecified: Secondary | ICD-10-CM

## 2021-11-21 NOTE — Telephone Encounter (Signed)
Please advise 

## 2021-11-24 ENCOUNTER — Other Ambulatory Visit (HOSPITAL_BASED_OUTPATIENT_CLINIC_OR_DEPARTMENT_OTHER): Payer: Self-pay | Admitting: *Deleted

## 2021-11-24 DIAGNOSIS — I10 Essential (primary) hypertension: Secondary | ICD-10-CM

## 2021-11-24 DIAGNOSIS — E7800 Pure hypercholesterolemia, unspecified: Secondary | ICD-10-CM

## 2021-11-26 ENCOUNTER — Ambulatory Visit (HOSPITAL_BASED_OUTPATIENT_CLINIC_OR_DEPARTMENT_OTHER): Payer: Medicare Other | Admitting: Cardiovascular Disease

## 2021-12-04 ENCOUNTER — Other Ambulatory Visit (HOSPITAL_BASED_OUTPATIENT_CLINIC_OR_DEPARTMENT_OTHER): Payer: Medicare Other

## 2021-12-16 ENCOUNTER — Ambulatory Visit (INDEPENDENT_AMBULATORY_CARE_PROVIDER_SITE_OTHER): Payer: Medicare Other

## 2021-12-16 DIAGNOSIS — I10 Essential (primary) hypertension: Secondary | ICD-10-CM | POA: Diagnosis not present

## 2021-12-16 DIAGNOSIS — R600 Localized edema: Secondary | ICD-10-CM | POA: Diagnosis not present

## 2021-12-16 LAB — ECHOCARDIOGRAM COMPLETE
AR max vel: 2.2 cm2
AV Area VTI: 2.41 cm2
AV Area mean vel: 2.01 cm2
AV Mean grad: 5 mmHg
AV Peak grad: 9.6 mmHg
AV Vena cont: 0.2 cm
Ao pk vel: 1.55 m/s
Area-P 1/2: 3.89 cm2
P 1/2 time: 608 msec
S' Lateral: 2.61 cm

## 2021-12-30 DIAGNOSIS — R202 Paresthesia of skin: Secondary | ICD-10-CM | POA: Diagnosis not present

## 2021-12-30 DIAGNOSIS — D485 Neoplasm of uncertain behavior of skin: Secondary | ICD-10-CM | POA: Diagnosis not present

## 2021-12-30 DIAGNOSIS — D1801 Hemangioma of skin and subcutaneous tissue: Secondary | ICD-10-CM | POA: Diagnosis not present

## 2021-12-30 DIAGNOSIS — L821 Other seborrheic keratosis: Secondary | ICD-10-CM | POA: Diagnosis not present

## 2022-02-12 DIAGNOSIS — L820 Inflamed seborrheic keratosis: Secondary | ICD-10-CM | POA: Diagnosis not present

## 2022-02-18 DIAGNOSIS — H401112 Primary open-angle glaucoma, right eye, moderate stage: Secondary | ICD-10-CM | POA: Diagnosis not present

## 2022-02-18 DIAGNOSIS — H2513 Age-related nuclear cataract, bilateral: Secondary | ICD-10-CM | POA: Diagnosis not present

## 2022-02-18 DIAGNOSIS — H53431 Sector or arcuate defects, right eye: Secondary | ICD-10-CM | POA: Diagnosis not present

## 2022-02-18 DIAGNOSIS — H401121 Primary open-angle glaucoma, left eye, mild stage: Secondary | ICD-10-CM | POA: Diagnosis not present

## 2022-02-25 DIAGNOSIS — G4733 Obstructive sleep apnea (adult) (pediatric): Secondary | ICD-10-CM | POA: Diagnosis not present

## 2022-02-25 DIAGNOSIS — I10 Essential (primary) hypertension: Secondary | ICD-10-CM | POA: Diagnosis not present

## 2022-03-03 DIAGNOSIS — L539 Erythematous condition, unspecified: Secondary | ICD-10-CM | POA: Diagnosis not present

## 2022-03-03 DIAGNOSIS — L089 Local infection of the skin and subcutaneous tissue, unspecified: Secondary | ICD-10-CM | POA: Diagnosis not present

## 2022-03-04 DIAGNOSIS — I10 Essential (primary) hypertension: Secondary | ICD-10-CM | POA: Diagnosis not present

## 2022-03-04 DIAGNOSIS — G4733 Obstructive sleep apnea (adult) (pediatric): Secondary | ICD-10-CM | POA: Diagnosis not present

## 2022-03-04 DIAGNOSIS — R7303 Prediabetes: Secondary | ICD-10-CM | POA: Diagnosis not present

## 2022-03-18 DIAGNOSIS — R7303 Prediabetes: Secondary | ICD-10-CM | POA: Diagnosis not present

## 2022-03-18 DIAGNOSIS — I10 Essential (primary) hypertension: Secondary | ICD-10-CM | POA: Diagnosis not present

## 2022-04-09 DIAGNOSIS — L723 Sebaceous cyst: Secondary | ICD-10-CM | POA: Diagnosis not present

## 2022-04-09 DIAGNOSIS — R030 Elevated blood-pressure reading, without diagnosis of hypertension: Secondary | ICD-10-CM | POA: Diagnosis not present

## 2022-04-10 ENCOUNTER — Telehealth: Payer: Self-pay | Admitting: Internal Medicine

## 2022-04-10 ENCOUNTER — Other Ambulatory Visit: Payer: Self-pay

## 2022-04-10 DIAGNOSIS — Z1211 Encounter for screening for malignant neoplasm of colon: Secondary | ICD-10-CM

## 2022-04-10 NOTE — Telephone Encounter (Signed)
Patient called states she would like to schedule an appoint with Dr Raquel James to check her IBS buts also do her cologuard. States she never had a colon done before but there is a recall for colon in Epic. Requesting a call back. Please call to advise. Thank you

## 2022-04-10 NOTE — Telephone Encounter (Signed)
Pt scheduled to see Dr. Hilarie Fredrickson 06/25/22 at 1:50pm. Pt aware of appt and cologuard ordered for pt as recall is for 10/23.

## 2022-04-20 DIAGNOSIS — I10 Essential (primary) hypertension: Secondary | ICD-10-CM | POA: Diagnosis not present

## 2022-04-20 DIAGNOSIS — R7303 Prediabetes: Secondary | ICD-10-CM | POA: Diagnosis not present

## 2022-04-21 DIAGNOSIS — R202 Paresthesia of skin: Secondary | ICD-10-CM | POA: Diagnosis not present

## 2022-04-21 DIAGNOSIS — R238 Other skin changes: Secondary | ICD-10-CM | POA: Diagnosis not present

## 2022-04-21 DIAGNOSIS — L720 Epidermal cyst: Secondary | ICD-10-CM | POA: Diagnosis not present

## 2022-04-21 DIAGNOSIS — B379 Candidiasis, unspecified: Secondary | ICD-10-CM | POA: Diagnosis not present

## 2022-04-21 DIAGNOSIS — L820 Inflamed seborrheic keratosis: Secondary | ICD-10-CM | POA: Diagnosis not present

## 2022-04-28 DIAGNOSIS — U071 COVID-19: Secondary | ICD-10-CM | POA: Diagnosis not present

## 2022-04-29 ENCOUNTER — Encounter: Payer: Self-pay | Admitting: Internal Medicine

## 2022-05-07 DIAGNOSIS — Z1211 Encounter for screening for malignant neoplasm of colon: Secondary | ICD-10-CM | POA: Diagnosis not present

## 2022-05-14 LAB — COLOGUARD: COLOGUARD: NEGATIVE

## 2022-05-21 ENCOUNTER — Other Ambulatory Visit (HOSPITAL_COMMUNITY)
Admission: RE | Admit: 2022-05-21 | Discharge: 2022-05-21 | Disposition: A | Discharge status: Home / Self Care | Payer: Medicare Other | Source: Ambulatory Visit | Attending: Obstetrics & Gynecology | Admitting: Obstetrics & Gynecology

## 2022-05-21 ENCOUNTER — Encounter (HOSPITAL_BASED_OUTPATIENT_CLINIC_OR_DEPARTMENT_OTHER): Payer: Self-pay | Admitting: Obstetrics & Gynecology

## 2022-05-21 ENCOUNTER — Ambulatory Visit (INDEPENDENT_AMBULATORY_CARE_PROVIDER_SITE_OTHER): Payer: Medicare Other | Admitting: Obstetrics & Gynecology

## 2022-05-21 VITALS — BP 154/72 | HR 83 | Ht 65.5 in | Wt 205.0 lb

## 2022-05-21 DIAGNOSIS — L292 Pruritus vulvae: Secondary | ICD-10-CM

## 2022-05-21 DIAGNOSIS — Z9889 Other specified postprocedural states: Secondary | ICD-10-CM

## 2022-05-21 DIAGNOSIS — R7303 Prediabetes: Secondary | ICD-10-CM

## 2022-05-21 DIAGNOSIS — Z124 Encounter for screening for malignant neoplasm of cervix: Secondary | ICD-10-CM | POA: Diagnosis present

## 2022-05-21 DIAGNOSIS — D069 Carcinoma in situ of cervix, unspecified: Secondary | ICD-10-CM | POA: Insufficient documentation

## 2022-05-21 DIAGNOSIS — Z1151 Encounter for screening for human papillomavirus (HPV): Secondary | ICD-10-CM | POA: Insufficient documentation

## 2022-05-21 DIAGNOSIS — Z9189 Other specified personal risk factors, not elsewhere classified: Secondary | ICD-10-CM | POA: Diagnosis not present

## 2022-05-21 MED ORDER — FLUCONAZOLE 150 MG PO TABS: ORAL_TABLET | ORAL | 0 refills | Status: DC

## 2022-05-21 NOTE — Progress Notes (Signed)
71 y.o. G1P1 Married White or Caucasian female here for breast and pelvic exam.  I am also following her for h/o LEEP and CIN 3.  Denies vaginal bleeding.  Guidelines reviewed.  She does desire to repeat pap and HR HPV today.  Has had some vulvar yeast issues.  Having a little vulvar itching today.  Patient's last menstrual period was 07/06/2010.          Sexually active: No.   H/o HR HPV.  Health Maintenance: PCP:  Dr. Tamala Julian.  Last wellness appt was this spring.  Did blood work at that appt: yes Vaccines are up to date:  yes Colonoscopy:  cologuard 05/07/2022, Dr. Hilarie Fredrickson MMG:  10/06/2021 Negative BMD:  10/10/2020, osteopenia Last pap smear:  05/12/2021 Negative.   H/o abnormal pap smear:  yes, LEEP    reports that she quit smoking about 43 years ago. Her smoking use included cigarettes. She has never used smokeless tobacco. She reports that she does not currently use alcohol. She reports that she does not use drugs.  Past Medical History:  Diagnosis Date   Abnormal Pap smear of cervix    Anxiety    Aortic atherosclerosis ([place])    Atypical chest pain 10/08/2020   Symptoms are very atypical.  She feels good with exercise and notes that in general she is feeling better since she started the exercise program.  She had a normal stress test in 2021.  No repeat ischemia testing advised.  She does also report difficulty and pain with swallowing with referred pain into her back.  She has GI follow-up.   Diverticulitis    Fatty liver disease, nonalcoholic    Glaucoma    H. pylori infection    H/O dizziness    Hemorrhoids    History of depression    incest survivor/dysthymia   Hypertension    Hyperthyroidism    Hypokalemia 04/29/2020   IBS (irritable bowel syndrome)    lactose intolerance   OSA (obstructive sleep apnea) 11/17/2012   Palpitations 04/29/2020   Restless leg syndrome    Sleep apnea    Snoring 11/17/2012   Thyroid disease    hypothyroid   Varicose veins     Past Surgical  History:  Procedure Laterality Date   CHOLECYSTECTOMY  07/19/2017   Dr. Donne Hazel   COLPOSCOPY  99, 00, 02, 04, 06   ENDOVENOUS ABLATION SAPHENOUS VEIN W/ LASER Right 04-08-2015   endovenous laser ablation (right greater saphenous vein) by Victorino Dike MD     LEEP  3/09   CIN II/III    Current Outpatient Medications  Medication Sig Dispense Refill   ALPRAZolam (XANAX) 0.25 MG tablet Take 0.125 mg by mouth every 6 (six) hours as needed for anxiety.     EPINEPHrine 0.3 mg/0.3 mL IJ SOAJ injection Inject 0.3 mg into the muscle as needed for anaphylaxis.  0   fluticasone (FLONASE) 50 MCG/ACT nasal spray Place 2 sprays into both nostrils daily as needed for allergies.      lactase (LACTAID) 3000 units tablet Take 3,000 Units by mouth as needed (lactaid).      levothyroxine (SYNTHROID) 112 MCG tablet Take 112 mcg by mouth daily.     Magnesium 200 MG TABS Take 200 mg by mouth daily.     Multiple Vitamin (MULTIVITAMIN WITH MINERALS) TABS Take 1 tablet by mouth daily.     saccharomyces boulardii (FLORASTOR) 250 MG capsule Take one tablet BID x 1 month 60 capsule 0   timolol (TIMOPTIC)  0.5 % ophthalmic solution Place 1 drop into both eyes every morning.  3   rosuvastatin (CRESTOR) 10 MG tablet Take 1 tablet (10 mg total) by mouth daily. (Patient not taking: Reported on 05/21/2022) 90 tablet 3   No current facility-administered medications for this visit.    Family History  Problem Relation Age of Onset   Hypertension Father    Diabetes Father    Dementia Father    Hyperlipidemia Father    Prostate cancer Father    Colon polyps Father    Parkinson's disease Mother    Dementia Mother    Hypertension Mother    Breast cancer Cousin        maternal side of family   Dementia Brother    Colon polyps Brother    Diabetes Brother    Seizures Brother    Heart disease Maternal Uncle    Diabetes Paternal Grandmother    Colon cancer Neg Hx    Esophageal cancer Neg Hx    Stomach cancer Neg Hx     Liver disease Neg Hx    Pancreatic cancer Neg Hx     Review of Systems  Constitutional: Negative.   Genitourinary: Negative.     Exam:   BP (!) 154/72 (BP Location: Left Arm, Patient Position: Sitting, Cuff Size: Large)   Pulse 83   Ht 5' 5.5" (1.664 m) Comment: Reported  Wt 205 lb (93 kg)   LMP 07/06/2010   BMI 33.59 kg/m   Height: 5' 5.5" (166.4 cm) (Reported)  General appearance: alert, cooperative and appears stated age Breasts: normal appearance, no masses or tenderness Abdomen: soft, non-tender; bowel sounds normal; no masses,  no organomegaly Lymph nodes: Cervical, supraclavicular, and axillary nodes normal.  No abnormal inguinal nodes palpated Neurologic: Grossly normal  Pelvic: External genitalia:  no lesions              Urethra:  normal appearing urethra with no masses, tenderness or lesions              Bartholins and Skenes: normal                 Vagina: normal appearing vagina with atrophic changes and no discharge, no lesions              Cervix: no lesions              Pap taken: Yes.   Bimanual Exam:  Uterus:  normal size, contour, position, consistency, mobility, non-tender              Adnexa: normal adnexa and no mass, fullness, tenderness               Rectovaginal: Confirms               Anus:  normal sphincter tone, no lesions  Chaperone, Octaviano Batty, CMA, was present for exam.  Assessment/Plan: 1. GYN exam for high-risk Medicare patient - Pap smear and HR HPV obtained today - Mammogram 10/2021 - Declines colonoscopy.  Cologuard 05/2022 negative. - Bone mineral density up to date - lab work done with PCP, Dr. Tamala Julian - vaccines reviewed/updated  2. Cervical cancer screening - Cytology - PAP( [place])  3. CIN III (cervical intraepithelial neoplasia grade III) with severe dysplasia - Cytology - PAP( [place])  4. Prediabetes  5. H/O LEEP - Cytology - PAP( [place])  6.  Vulvar itching - rx for fluconazole sent to pharmacy

## 2022-05-26 LAB — CYTOLOGY - PAP
Comment: NEGATIVE
High risk HPV: NEGATIVE

## 2022-05-27 ENCOUNTER — Other Ambulatory Visit (HOSPITAL_BASED_OUTPATIENT_CLINIC_OR_DEPARTMENT_OTHER): Payer: Self-pay | Admitting: *Deleted

## 2022-05-27 MED ORDER — ESTRADIOL 0.1 MG/GM VA CREA: 1.0000 | TOPICAL_CREAM | VAGINAL | 0 refills | Status: DC

## 2022-06-01 ENCOUNTER — Encounter (HOSPITAL_BASED_OUTPATIENT_CLINIC_OR_DEPARTMENT_OTHER): Payer: Self-pay | Admitting: Cardiovascular Disease

## 2022-06-01 ENCOUNTER — Ambulatory Visit (HOSPITAL_BASED_OUTPATIENT_CLINIC_OR_DEPARTMENT_OTHER): Payer: Medicare Other | Admitting: Cardiovascular Disease

## 2022-06-01 VITALS — BP 146/72 | HR 94 | Ht 65.5 in | Wt 209.3 lb

## 2022-06-01 DIAGNOSIS — G4733 Obstructive sleep apnea (adult) (pediatric): Secondary | ICD-10-CM | POA: Diagnosis not present

## 2022-06-01 DIAGNOSIS — E7800 Pure hypercholesterolemia, unspecified: Secondary | ICD-10-CM

## 2022-06-01 DIAGNOSIS — I10 Essential (primary) hypertension: Secondary | ICD-10-CM

## 2022-06-01 DIAGNOSIS — Z5181 Encounter for therapeutic drug level monitoring: Secondary | ICD-10-CM

## 2022-06-01 NOTE — Patient Instructions (Signed)
Medication Instructions:  Your physician recommends that you continue on your current medications as directed. Please refer to the Current Medication list given to you today.  *If you need a refill on your cardiac medications before your next appointment, please call your pharmacy*  Lab Work: FASTING LP/CMET SOON   If you have labs (blood work) drawn today and your tests are completely normal, you will receive your results only by: [place] (if you have MyChart) OR A paper copy in the mail If you have any lab test that is abnormal or we need to change your treatment, we will call you to review the results.  Testing/Procedures: NONE  Follow-Up: At [facility], you and your health needs are our priority.  As part of our continuing mission to provide you with exceptional heart care, we have created designated Provider Care Teams.  These Care Teams include your primary Cardiologist (physician) and Advanced Practice Providers (APPs -  Physician Assistants and Nurse Practitioners) who all work together to provide you with the care you need, when you need it.  We recommend signing up for the patient portal called "MyChart".  Sign up information is provided on this After Visit Summary.  MyChart is used to connect with patients for Virtual Visits (Telemedicine).  Patients are able to view lab/test results, encounter notes, upcoming appointments, etc.  Non-urgent messages can be sent to your provider as well.   To learn more about what you can do with MyChart, go to [email or web address].    Your next appointment:   2 month(s)  The format for your next appointment:   In Person  Provider:   Skeet Latch, MD or Laurann Montana, NP    Other Instructions MONITOR AND LOG YOUR BLOOD PRESSURE AT HOME

## 2022-06-01 NOTE — Assessment & Plan Note (Signed)
She wanted to wait and work on diet and exercise prior to starting rosuvastatin.  She will come in for fasting lipids and a CMP.  If elevated we will need to start the rosuvastatin.

## 2022-06-01 NOTE — Assessment & Plan Note (Signed)
Does not tolerate CPAP.

## 2022-06-01 NOTE — Assessment & Plan Note (Addendum)
She had white coat hypertension superimposed on essential hypertension.  Overall BP has been stable at home.  It was consistent with stage I hypertension on her 24-hour blood pressure monitor.  She has been struggling with weight gain and I suspect that it might be creeping up.  She is working with her psychiatrist and will likely be starting on guanfacine soon, which is an alpha-blocker and may also help with her blood pressure.  Therefore would not start any medications for her blood pressure at this time.  Recommend that she check her pressures and may consider adding spironolactone if needed.  She struggled with hypokalemia on HCTZ.

## 2022-06-01 NOTE — Progress Notes (Signed)
Cardiology Office Note   Date:  06/01/2022   ID:  Jodi [name], DOB [birth year], MRN [id number]  PCP:  Carol Ada, MD  Cardiologist:   Skeet Latch, MD  No chief complaint on file.   History of Present Illness: Jodi [name] is a 71 y.o. female with OSA not on CPAP, hypertension and anxiety here for follow up.  She was initially seen for the evaluation of palpitations and shortness of breath.  In early September she was talking to a neighbor and when she stood up she felt like her heart rate stopped and then she felt presyncopal but didn't pass out.  She reports four similar episodes.  She went to the ED each time.  The first time her potassium was noted to be low so she was given a potassium supplement.  Her potassium was 2.9.  She reports being on HCTZ for years.  She notes an occasional fluttering.  She also feels short of breath at times when it occurs.  She didn't feel it while wearing the monitor at the hospital.  She notes that her heart beats hard when she tries to exert herself.  She likes to walk a lot for exercise.  However now, even with carrying laundry up and down steps she is more short of breath than usual.  The symptoms mostly improved after she started potassium.  She continues to have lightheadedness if she bent over.  She saw her PCP and her BP was elevated so she was started on olmesartan.  However she thinks that she has white coat syndrome.  She noted vertigo so she stopped taking it.  She notes that she worries a lot about her health and gets anxious thinking about it.  She hears her heat when trying to sleep.  It seems to be regular. She has no edema, orthopnea or PND.    Ms. Sandall had orthostatic symptoms and palpitations.  Her potassium was also low.  She we switched from HCTZ to amlodipine.  She stopped amlodipine because it irritated her IBS.  She tried 1 dose of carvedilol and stopped it because it made her very sleepy.  She had an ETT 04/2020 that was  negative. She achieved 7 METs on a Bruce protocol. She also wore an ambulatory monitor that revealed PACs and sinus tachcyardia that corresponded to episodes of heart fluttering. It was recommeded that she start carvedilol but felt fatigued. She has OSA and has been off her CPAP machine for at least three years. She had a repeat sleep study and was negative for OSA. She was referred to PREP program through the [facility]   She reported atypical chest pain and noted that her anxiety was poorly controlled. She wore a 24-hour blood pressure monitor that showed her blood pressure was markedly elevated the first hour of wearing it and averaged 158/91. Her 24-hour average was 138/75. Se was diagnosed with OSA and was given a CPAP. Blood pressures remained controlled at her last visit, but she wanted to avoid other medications. She noted some lower extremity edema and had an echo 12/23/21 that revealed LVEF and grade-1 diastolic, right atrial pressure was 3.   Today, she has been doing well. She noted that she no longer experiences palpitations, which she attributes that her anxiety has improved. Her LE edema over the past few months have improved. She stopped taking her medication for her cholesterol until she has her cholesterol levels rechecked. Recently, she followed-up with her psychiatrist and was  recommended to start on Guanfacine for ADD management, but was concerned of her blood pressure dropping. She is still going to the [facility] for physical activity. She noticed that her dietary habits have been off and have been eating more sugars. She has also been gaining weight and believes that she needs to lose her weight. She notes that she has not experienced any anxiety in the past year. She contracted COVID-19 back in October.   Past Medical History:  Diagnosis Date   Abnormal Pap smear of cervix    Anxiety    Aortic atherosclerosis ([place])    Atypical chest pain 10/08/2020   Symptoms are very atypical.  She feels good  with exercise and notes that in general she is feeling better since she started the exercise program.  She had a normal stress test in 2021.  No repeat ischemia testing advised.  She does also report difficulty and pain with swallowing with referred pain into her back.  She has GI follow-up.   Diverticulitis    Fatty liver disease, nonalcoholic    Glaucoma    H. pylori infection    H/O dizziness    Hemorrhoids    History of depression    incest survivor/dysthymia   Hypertension    Hyperthyroidism    Hypokalemia 04/29/2020   IBS (irritable bowel syndrome)    lactose intolerance   OSA (obstructive sleep apnea) 11/17/2012   Palpitations 04/29/2020   Restless leg syndrome    Sleep apnea    Snoring 11/17/2012   Thyroid disease    hypothyroid   Varicose veins     Past Surgical History:  Procedure Laterality Date   CHOLECYSTECTOMY  07/19/2017   Dr. Donne Hazel   COLPOSCOPY  99, 00, 02, 04, 06   ENDOVENOUS ABLATION SAPHENOUS VEIN W/ LASER Right 04-08-2015   endovenous laser ablation (right greater saphenous vein) by Victorino Dike MD     LEEP  3/09   CIN II/III     Current Outpatient Medications  Medication Sig Dispense Refill   ALPRAZolam (XANAX) 0.25 MG tablet Take 0.125 mg by mouth every 6 (six) hours as needed for anxiety.     EPINEPHrine 0.3 mg/0.3 mL IJ SOAJ injection Inject 0.3 mg into the muscle as needed for anaphylaxis.  0   estradiol (ESTRACE VAGINAL) 0.1 MG/GM vaginal cream Place 1 Applicatorful vaginally 2 (two) times a week. 42.5 g 0   fluconazole (DIFLUCAN) 150 MG tablet Take 1 tablet and repeat in 72 hours 2 tablet 0   fluticasone (FLONASE) 50 MCG/ACT nasal spray Place 2 sprays into both nostrils daily as needed for allergies.      lactase (LACTAID) 3000 units tablet Take 3,000 Units by mouth as needed (lactaid).      levothyroxine (SYNTHROID) 112 MCG tablet Take 112 mcg by mouth daily.     Magnesium 200 MG TABS Take 200 mg by mouth daily.     Multiple Vitamin  (MULTIVITAMIN WITH MINERALS) TABS Take 1 tablet by mouth daily.     saccharomyces boulardii (FLORASTOR) 250 MG capsule Take one tablet BID x 1 month 60 capsule 0   timolol (TIMOPTIC) 0.5 % ophthalmic solution Place 1 drop into both eyes every morning.  3   rosuvastatin (CRESTOR) 10 MG tablet Take 1 tablet (10 mg total) by mouth daily. (Patient not taking: Reported on 05/21/2022) 90 tablet 3   No current facility-administered medications for this visit.    Allergies:   Sulfites, Amlodipine, Iodine, Macrodantin [nitrofurantoin macrocrystal], Serotonin reuptake inhibitors (  ssris), Shellfish allergy, Venlafaxine, Almond oil, and Bentyl [dicyclomine hcl]    Social History:  The patient  reports that she quit smoking about 43 years ago. Her smoking use included cigarettes. She has never used smokeless tobacco. She reports that she does not currently use alcohol. She reports that she does not use drugs.   Family History:  The patient's family history includes Breast cancer in her cousin; Colon polyps in her brother and father; Dementia in her brother, father, and mother; Diabetes in her brother, father, and paternal grandmother; Heart disease in her maternal uncle; Hyperlipidemia in her father; Hypertension in her father and mother; Parkinson's disease in her mother; Prostate cancer in her father; Seizures in her brother.    ROS:   Please see the history of present illness.  All other systems are reviewed and negative.   PHYSICAL EXAM: VS:  BP (!) 146/72 (BP Location: Left Arm, Patient Position: Sitting, Cuff Size: Large)   Pulse 94   Ht 5' 5.5" (1.664 m)   Wt 209 lb 4.8 oz (94.9 kg)   LMP 07/06/2010   BMI 34.30 kg/m  , BMI Body mass index is 34.3 kg/m. GENERAL:  Well appearing HEENT: Pupils equal round and reactive, fundi not visualized, oral mucosa unremarkable NECK:  No jugular venous distention, waveform within normal limits, carotid upstroke brisk and symmetric, no bruits LUNGS:  Clear  to auscultation bilaterally HEART:  RRR.  PMI not displaced or sustained,S1 and S2 within normal limits, no S3, no S4, no clicks, no rubs, no murmurs ABD:  Flat, positive bowel sounds normal in frequency in pitch, no bruits, no rebound, no guarding, no midline pulsatile mass, no hepatomegaly, no splenomegaly EXT:  2 plus pulses throughout, non-pitting LE edema R>L, no cyanosis no clubbing SKIN:  No rashes no nodules NEURO:  Cranial nerves II through XII grossly intact, motor grossly intact throughout PSYCH:  Cognitively intact, oriented to person place and time  EKG:   06/01/2022: Sinus rhythm. Rate 94 bpm. 08/27/21: Sinus rhythm, rate 86 bpm 02/20/2021: Sinus rhythm. Rate 83 bpm. 04/23/2020: Sinus rhythm.  Rate 96 bpm.  Low voltage precordial leads.  Blood Pressure Monitor 10/21/2020: 24 Hour Ambulatory BP Report   24 Hour Average BP: 138/75, HR 82 Average Daily BP : 141/77, HR  82 Average Night BP: 129/66, HR 78 Percentage Nightly Change: -7.9% in SBP, -14.2% DBP White Coat Period (1st Hour): Average BP 158/91, HR 101 Overall BP Range: 100-172/56-100   Interpretation:  Stage I Hypertension with superimposed White Coat Hypertension    24 Hour Average BP: 138/77  Average Daily BP :  141/79 Average Night BP: 130/69 Percentage Nightly Change: -7.5% in SBP, -12.7% DBP White Coat Period (1st Hour): Average BP 136/80 Overall BP Range: 100-172/56-100   Interpretation:  Stage I Hypertension.  Essentially unchanged from prior.   7 Day Event Monitor 05/2020: Quality: Fair.  Baseline artifact. Predominant rhythm: sinus rhythm Average heart rate: 85 bpm Max heart rate: 140 bpm Min heart rate: 58 bpm Pauses >2.5 seconds: none Occasional PACs 8 beat run of atrial tachycardia   Patient reported heart fluttering. At these times, sinus rhythm, PACs and sinus tachycardia were noted.  ETT 05/01/20: Upsloping ST segment depression ST segment depression was noted during stress in the II,  III, aVF, V6, V5, V4 and V3 leads. Blood pressure demonstrated a normal response to exercise. Negative, adequate stress test.  CTA Chest 04/23/20 IMPRESSION: 1. Negative for pulmonary embolus. 2. Lungs are clear. 3. Aortic atherosclerosis. (  ICD10-I70.0).  Recent Labs: 08/27/2021: TSH 3.000 11/18/2021: ALT 21; BUN 7; Creatinine, Ser 0.65; Potassium 4.3; Sodium 143    Lipid Panel    Component Value Date/Time   CHOL 218 (H) 11/18/2021 1001   TRIG 139 11/18/2021 1001   HDL 58 11/18/2021 1001   CHOLHDL 3.8 11/18/2021 1001   LDLCALC 135 (H) 11/18/2021 1001      Wt Readings from Last 3 Encounters:  06/01/22 209 lb 4.8 oz (94.9 kg)  05/21/22 205 lb (93 kg)  11/19/21 201 lb 14.4 oz (91.6 kg)      ASSESSMENT AND PLAN: Essential (primary) hypertension She had white coat hypertension superimposed on essential hypertension.  Overall BP has been stable at home.  It was consistent with stage I hypertension on her 24-hour blood pressure monitor.  She has been struggling with weight gain and I suspect that it might be creeping up.  She is working with her psychiatrist and will likely be starting on guanfacine soon, which is an alpha-blocker and may also help with her blood pressure.  Therefore would not start any medications for her blood pressure at this time.  Recommend that she check her pressures and may consider adding spironolactone if needed.  She struggled with hypokalemia on HCTZ.  Obstructive sleep apnea syndrome Does not tolerate CPAP.  Pure hypercholesterolemia She wanted to wait and work on diet and exercise prior to starting rosuvastatin.  She will come in for fasting lipids and a CMP.  If elevated we will need to start the rosuvastatin.   Current medicines are reviewed at length with the patient today.  The patient does not have concerns regarding medicines.  The following changes have been made: start rosuvastatin   Labs/ tests ordered today include:   Orders Placed  This Encounter  Procedures   Lipid panel   Comprehensive metabolic panel    Disposition: FU with Othello Dickenson C. Oval Linsey, MD, [facility] in 2 months   I,Danny Valdes,acting as a Education administrator for Skeet Latch, MD.,have documented all relevant documentation on the behalf of Skeet Latch, MD,as directed by  Skeet Latch, MD while in the presence of Skeet Latch, MD.  I, [place] Oval Linsey, MD have reviewed all documentation for this visit.  The documentation of the exam, diagnosis, procedures, and orders on 06/01/2022 are all accurate and complete.   Signed, Lenay Lovejoy C. Oval Linsey, MD, [facility]  06/01/2022 11:54 AM    [place]

## 2022-06-02 NOTE — Addendum Note (Signed)
Addended by: Ernie Hew D on: 06/02/2022 03:32 PM   Modules accepted: Orders

## 2022-06-11 DIAGNOSIS — H401112 Primary open-angle glaucoma, right eye, moderate stage: Secondary | ICD-10-CM | POA: Diagnosis not present

## 2022-06-11 DIAGNOSIS — H2513 Age-related nuclear cataract, bilateral: Secondary | ICD-10-CM | POA: Diagnosis not present

## 2022-06-11 DIAGNOSIS — H401121 Primary open-angle glaucoma, left eye, mild stage: Secondary | ICD-10-CM | POA: Diagnosis not present

## 2022-06-11 DIAGNOSIS — H53431 Sector or arcuate defects, right eye: Secondary | ICD-10-CM | POA: Diagnosis not present

## 2022-06-18 DIAGNOSIS — D225 Melanocytic nevi of trunk: Secondary | ICD-10-CM | POA: Diagnosis not present

## 2022-06-18 DIAGNOSIS — Z808 Family history of malignant neoplasm of other organs or systems: Secondary | ICD-10-CM | POA: Diagnosis not present

## 2022-06-18 DIAGNOSIS — L578 Other skin changes due to chronic exposure to nonionizing radiation: Secondary | ICD-10-CM | POA: Diagnosis not present

## 2022-06-18 DIAGNOSIS — L570 Actinic keratosis: Secondary | ICD-10-CM | POA: Diagnosis not present

## 2022-06-18 DIAGNOSIS — D485 Neoplasm of uncertain behavior of skin: Secondary | ICD-10-CM | POA: Diagnosis not present

## 2022-06-18 DIAGNOSIS — L720 Epidermal cyst: Secondary | ICD-10-CM | POA: Diagnosis not present

## 2022-06-18 DIAGNOSIS — L821 Other seborrheic keratosis: Secondary | ICD-10-CM | POA: Diagnosis not present

## 2022-06-18 DIAGNOSIS — L814 Other melanin hyperpigmentation: Secondary | ICD-10-CM | POA: Diagnosis not present

## 2022-06-18 DIAGNOSIS — D229 Melanocytic nevi, unspecified: Secondary | ICD-10-CM | POA: Diagnosis not present

## 2022-06-18 DIAGNOSIS — L739 Follicular disorder, unspecified: Secondary | ICD-10-CM | POA: Diagnosis not present

## 2022-06-18 DIAGNOSIS — L820 Inflamed seborrheic keratosis: Secondary | ICD-10-CM | POA: Diagnosis not present

## 2022-06-25 ENCOUNTER — Encounter: Payer: Self-pay | Admitting: Internal Medicine

## 2022-06-25 ENCOUNTER — Ambulatory Visit: Payer: Medicare Other | Admitting: Internal Medicine

## 2022-06-25 VITALS — BP 132/76 | HR 96 | Ht 65.5 in | Wt 208.0 lb

## 2022-06-25 DIAGNOSIS — K648 Other hemorrhoids: Secondary | ICD-10-CM

## 2022-06-25 DIAGNOSIS — K219 Gastro-esophageal reflux disease without esophagitis: Secondary | ICD-10-CM | POA: Diagnosis not present

## 2022-06-25 DIAGNOSIS — A048 Other specified bacterial intestinal infections: Secondary | ICD-10-CM

## 2022-06-25 NOTE — Patient Instructions (Signed)
Please follow up as needed.  _______________________________________________________  If you are age 71 or older, your body mass index should be between 23-30. Your Body mass index is 34.09 kg/m. If this is out of the aforementioned range listed, please consider follow up with your Primary Care Provider.  If you are age 83 or younger, your body mass index should be between 19-25. Your Body mass index is 34.09 kg/m. If this is out of the aformentioned range listed, please consider follow up with your Primary Care Provider.   ________________________________________________________  The [place] [facility] providers would like to encourage you to use [facility] to communicate with providers for non-urgent requests or questions.  Due to long hold times on the telephone, sending your provider a message by [facility] may be a faster and more efficient way to get a response.  Please allow 48 business hours for a response.  Please remember that this is for non-urgent requests.  _______________________________________________________  Due to recent changes in healthcare laws, you may see the results of your imaging and laboratory studies on MyChart before your provider has had a chance to review them.  We understand that in some cases there may be results that are confusing or concerning to you. Not all laboratory results come back in the same time frame and the provider may be waiting for multiple results in order to interpret others.  Please give [place] 48 hours in order for your provider to thoroughly review all the results before contacting the office for clarification of your results.

## 2022-06-25 NOTE — Progress Notes (Signed)
   Subjective:    Patient ID: Jodi [name], female    DOB: Jan 21, 1951, 71 y.o.   MRN: [id number]  HPI Kathlene Yano is a 71 year old female with a history of treated H. pylori gastritis, personal history of jejunal diverticulitis, IBS, steatotic liver, anxiety, hypertension and hypothyroidism who is here for follow-up.  She is here alone today and was last seen in July 2022.  She has been successfully treated for H. pylori gastritis with Pylera.  She states that after antibiotics she has felt much better with much of her GI symptoms including what was previously felt to be "IBS".  Her acid reflux symptoms as well as epigastric discomfort and right upper quadrant pain have resolved entirely.  Her postprandial epigastric pain also better.  She will occasionally use Pepto for mild acid indigestion but this is rare.  She does still have issues with lactose and so she will use Lactaid tablets at a time.  She states that she has discovered that she had significant anxiety as well as help care related anxiety.  This is not a new issue but she has addressed this more intentionally of late.  She feels much better and continues to work with her counselor.  He was also diagnosed with ADHD at age 23.  Still occasional hemorrhoidal prolapse and rare spotting with wiping.  No hemorrhoidal pain.   Review of Systems As per HPI, otherwise negative  Current Medications, Allergies, Past Medical History, Past Surgical History, Family History and Social History were reviewed in [employer] record.    Objective:   Physical Exam BP 132/76   Pulse 96   Ht 5' 5.5" (1.664 m)   Wt 208 lb (94.3 kg)   LMP 07/06/2010   BMI 34.09 kg/m  Gen: awake, alert, NAD HEENT: anicteric Neuro: nonfocal  H. pylori stool antigen after treatment --negative    Assessment & Plan:  71 year old female with a history of treated H. pylori gastritis, personal history of jejunal diverticulitis, IBS, steatotic  liver, anxiety, hypertension and hypothyroidism who is here for follow-up.   H. pylori with associated gastritis --successfully treated as confirmed by post antibiotic H. pylori stool antigen.  She is not needing chronic acid suppression.  No further issues with epigastric pain.  2.  GERD --mild and intermittent.  Not needing chronic or daily therapy.  Can use over-the-counter therapy as needed and notify me if symptom worsens.  3.  Internal hemorrhoids with prolapse --she has thought more about hemorrhoidal banding, which would likely be a beneficial option for her.  She will call back to schedule a banding appointment if she wishes to proceed  4.  IBS with history of loose stool and chronic left-sided pain --the symptoms have largely abated which are likely related to H. pylori treatment but also more intentionally addressing her anxiety.  We discussed about the brain gut axis and how anxiety can overlap with IBS.  5.  Colon cancer screening --negative Cologuard in November 2023.  No prior colonoscopy but she is up-to-date with Cologuard testing and has had negative testing in 2017, 2020 and 2023. -- Consider repeat colonoscopy in 3 years  Follow-up as needed  30 minutes total spent today including patient facing time, coordination of care, reviewing medical history/procedures/pertinent radiology studies, and documentation of the encounter.

## 2022-07-14 ENCOUNTER — Ambulatory Visit (INDEPENDENT_AMBULATORY_CARE_PROVIDER_SITE_OTHER): Payer: Medicare Other

## 2022-07-14 ENCOUNTER — Ambulatory Visit: Payer: Medicare Other | Admitting: Family Medicine

## 2022-07-14 ENCOUNTER — Ambulatory Visit: Payer: Self-pay

## 2022-07-14 VITALS — BP 144/80 | HR 88 | Ht 65.5 in | Wt 209.0 lb

## 2022-07-14 DIAGNOSIS — M25531 Pain in right wrist: Secondary | ICD-10-CM | POA: Diagnosis not present

## 2022-07-14 DIAGNOSIS — M79641 Pain in right hand: Secondary | ICD-10-CM

## 2022-07-14 DIAGNOSIS — M25532 Pain in left wrist: Secondary | ICD-10-CM | POA: Diagnosis not present

## 2022-07-14 DIAGNOSIS — M19041 Primary osteoarthritis, right hand: Secondary | ICD-10-CM | POA: Diagnosis not present

## 2022-07-14 DIAGNOSIS — M19042 Primary osteoarthritis, left hand: Secondary | ICD-10-CM | POA: Diagnosis not present

## 2022-07-14 DIAGNOSIS — M79642 Pain in left hand: Secondary | ICD-10-CM

## 2022-07-14 NOTE — Patient Instructions (Addendum)
Thank you for coming in today.   Please get an Xray today before you leave   I've referred you to Occupational Therapy.  Let us know if you don't hear from them in one week.   Please use Voltaren gel (Generic Diclofenac Gel) up to 4x daily for pain as needed.  This is available over-the-counter as both the name brand Voltaren gel and the generic diclofenac gel.   Tumeric is ok, as is arnica cream.   Heat is often helpful for this.   Recheck in about 8 weeks.

## 2022-07-14 NOTE — Progress Notes (Signed)
   I, Peterson Lombard, LAT, ATC acting as a scribe for Lynne Leader, MD.  Subjective:    CC: Bilat wrist pain  HPI: Pt is a 14 female c/o bilat wrist pain, R>L x years. Pt c/o the R wrist started waking up over the past few weeks. Pt has started to work out and wonders if the pain is overuse. 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 Pt locates pain to R 1st MCP joint w/ shooting pain up into the thumb. Pain is in the same location on the L, but not as severe. Pt notes deformity in all of her fingers.   Grip strength: starting to diminish Paraesthesia: no Aggravates: trying to pick up an object, esp w/ supination Treatments tried: acupuncture,   Pertinent review of Systems: No fevers or chills  Relevant historical information: Hypertension.   Objective:    Vitals:   07/14/22 1313  BP: (!) 144/80  Pulse: 88  SpO2: 97%   General: Well Developed, well nourished, and in no acute distress.   MSK: Hands bilaterally degenerative changes and bossing base of thumbs worse right compared to left and at second DIP and second and third PIP right.  Normal hand motion.  Nontender.  Intact strength.  Lab and Radiology Results  X-ray images hands bilaterally obtained today personally and independently interpreted.  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  No acute fractures are visible  Left hand:   DJD predominantly at first [facility].    No acute fractures are visible  Await formal radiology review   Impression and Recommendations:    Assessment and Plan: 72 y.o. 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 Plan for trial of OT hand therapy. Also recommend voltaren gel. Recheck in about 6 weeks. Marland Kitchen  PDMP not reviewed this encounter. Orders Placed This Encounter  Procedures   DG Hand Complete Left    Standing Status:   Future    Number of Occurrences:   1    Standing Expiration Date:   08/14/2022    Order  Specific Question:   Reason for Exam (SYMPTOM  OR DIAGNOSIS REQUIRED)    Answer:   bilateral hand pain    Order Specific Question:   Preferred imaging location?    Answer:   [facility]   DG Hand Complete Right    Standing Status:   Future    Number of Occurrences:   1    Standing Expiration Date:   08/14/2022    Order Specific Question:   Reason for Exam (SYMPTOM  OR DIAGNOSIS REQUIRED)    Answer:   bilateral hand pain    Order Specific Question:   Preferred imaging location?    Answer:   [facility]   Ambulatory referral to Occupational Therapy    Referral Priority:   Routine    Referral Type:   Occupational Therapy    Referral Reason:   Specialty Services Required    Requested Specialty:   Occupational Therapy    Number of Visits Requested:   1   No orders of the defined types were placed in this encounter.   Discussed warning signs or symptoms. Please see discharge instructions. Patient expresses understanding.   The above documentation has been reviewed and is accurate and complete Lynne Leader, M.D.

## 2022-07-16 NOTE — Progress Notes (Signed)
Right hand x-ray shows arthritis medium at the index finger and at the thumb.

## 2022-07-16 NOTE — Progress Notes (Signed)
Left hand x-ray shows medium arthritis worse at the base of the thumb.

## 2022-07-17 NOTE — Therapy (Signed)
OUTPATIENT OCCUPATIONAL THERAPY ORTHO EVALUATION  Patient Name: Jodi [name] MRN: [id number] DOB:Aug 22, 1950, 72 y.o., female Today's Date: 07/21/2022  PCP: Carol Ada, MD REFERRING PROVIDER:  Gregor Hams, MD    END OF SESSION:  OT End of Session - 07/21/22 1338     Visit Number 1    Number of Visits 8    Date for OT Re-Evaluation 09/04/22    Authorization Type [employer] Medicare    Progress Note Due on Visit 10    OT Start Time 1340    OT Stop Time 1429    OT Time Calculation (min) 49 min    Activity Tolerance Patient tolerated treatment well;No increased pain;Patient limited by pain;Patient limited by fatigue    Behavior During Therapy [facility] for tasks assessed/performed             Past Medical History:  Diagnosis Date   Abnormal Pap smear of cervix    Anxiety    Aortic atherosclerosis ([place])    Atypical chest pain 10/08/2020   Symptoms are very atypical.  She feels good with exercise and notes that in general she is feeling better since she started the exercise program.  She had a normal stress test in 2021.  No repeat ischemia testing advised.  She does also report difficulty and pain with swallowing with referred pain into her back.  She has GI follow-up.   Diverticulitis    Fatty liver disease, nonalcoholic    Glaucoma    H. pylori infection    H/O dizziness    Hemorrhoids    History of depression    incest survivor/dysthymia   Hypertension    Hyperthyroidism    Hypokalemia 04/29/2020   IBS (irritable bowel syndrome)    lactose intolerance   OSA (obstructive sleep apnea) 11/17/2012   Palpitations 04/29/2020   Restless leg syndrome    Sleep apnea    Snoring 11/17/2012   Thyroid disease    hypothyroid   Varicose veins    Past Surgical History:  Procedure Laterality Date   CHOLECYSTECTOMY  07/19/2017   Dr. 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   Cholelithiasis without obstruction 11/08/2020   Attention deficit hyperactivity disorder 11/08/2020   Abnormal gait 11/08/2020   Spider veins 11/23/2019   Edema of both legs 11/23/2019   CIN III (cervical intraepithelial neoplasia grade III) with severe dysplasia 03/05/2017   Anxiety disorder 01/04/2015   Essential (primary) hypertension 01/04/2015   Hypothyroidism 01/04/2015   Pure hypercholesterolemia 01/04/2015   Right upper quadrant pain 01/01/2015   Vertigo, labyrinthine 11/17/2012   OSA (obstructive sleep apnea) 11/17/2012    ONSET DATE: acute on chronic b/l wrist/thumb pain  REFERRING DIAG:  M25.531,M25.532 (ICD-10-CM) - Bilateral wrist pain  M79.641,M79.642 (ICD-10-CM) - Pain in both hands    THERAPY DIAG:  Muscle weakness (generalized)  Pain in joint of left hand  Pain in joint of  right hand  Stiffness of left hand, not elsewhere classified  Stiffness of right hand, not elsewhere classified  Rationale for Evaluation and Treatment: Rehabilitation  SUBJECTIVE:   SUBJECTIVE STATEMENT: She is retired Immunologist, former caregiver.  She states her Lt hand/thumb pain started before her Rt, but the Rt hurts her worse. These things have bothered her for many years. She states a recent weight routine change may have flared her up.  He states her right thumb CMC J now wakes her up at night, which is why she is seeking help now.    PERTINENT HISTORY: Per MD: "Evaluate and treat. 2-3 times per week for  4-6 weeks. Modalities may include iontophoresis, phonophoresis, stim." "Pt is a 55 female c/o bilat wrist pain, R>L x years. Pt c/o the R wrist started [worsening] over the past few weeks. Pt has started to work out and wonders if the pain is overuse. 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 Pt locates pain to R 1st MCP joint w/ shooting pain up into the thumb. Pain is in the same location on the L, but not as severe. Pt notes deformity in all of her fingers.  "  PRECAUTIONS: None  WEIGHT BEARING RESTRICTIONS: No  PAIN:  Are you having pain? Not now at rest Rating: 0/10 at rest now, up to 10/10 at worst in past week, mostly at night or in bed (wakes her up)   FALLS: Has patient fallen in last 6 months? No  LIVING ENVIRONMENT: Lives with: lives with their spouse and lives with their son Has following equipment at home: None  PLOF: Independent  PATIENT GOALS: To decrease pain in the right hand primarily to sleep better at night and have better function.   OBJECTIVE: (All objective assessments below are from initial evaluation on: 07/21/22 unless otherwise specified.)    HAND DOMINANCE: Right   ADLs: Overall ADLs: States decreased ability to grab, hold household objects, pain and inability to open containers, perform FMS tasks (manipulate fasteners on clothing)   FUNCTIONAL OUTCOME MEASURES: Eval: Quck DASH 25% impairment today  (Higher % Score  =  More Impairment)    UPPER EXTREMITY ROM     Shoulder to Wrist AROM Right eval Left eval  Wrist flexion 63 68  Wrist extension 65 57  (Blank rows = not tested)   Hand AROM Right eval Left eval  Full Fist Ability (or Gap to Distal Palmar Crease) Full (IF a bit stiff) Full  Thumb Opposition  (Kapandji Scale)  9 9  Thumb MCP (0-60) 44 54  Thumb IP (0-80) 57 66  Thumb Radial Abduction Span 6.5cm  6cm  Thumb Palmar Abduction Span 6cm span  6cm   (Blank rows = not tested)   UPPER EXTREMITY MMT:    Eval: She had at least 4 / 5 MMT strength in bilateral wrists/hands in all planes.  Isometric strength was not very painful to her, only moving strength-which is typical with arthritis  HAND FUNCTION: Eval: Observed weakness in more affected Rt hand.  Grip strength Right: 45 lbs, Left: 49 lbs   COORDINATION: Eval: No significant observed coordination impairments with affected hands. Details TBD as needed  SENSATION: Eval:  Light touch intact today, no c/o numbness at night   COGNITION: Eval: Overall cognitive status: WFL for evaluation today   OBSERVATIONS:   Eval: Some overt evidence of Heberden's nodes  and Bouchard's nodes, some bulkiness around the thumb joints from arthritis.  Pain increased with motion but fairly strong and stable isometrically.  Interestingly her pain reports her worst in the night, but that could be due to lack of stimuli to distract her from her pain or a symptom of overuse from the days activities.   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   TODAY'S TREATMENT:  Post-evaluation treatment: She was educated on bracing options for her new exercise program-exercise gloves with wrist strap component to support the hands.  She was also educated to avoid repetitious use of hands if able, use modalities like heat to loosen hand which helped her today.  She was then educated on the following home exercise program but only the first 2 wrist stretches could be done in today's timeframe.  She states feeling better at the end of the session than at the start of the session.  She will need review of these exercises and to complete this home exercise program in the next 1-2 visits.  Exercises - Seated Wrist Flexion Stretch  - 2-3 x daily - 3 reps - 15 hold - Wrist Extension Stretch Pronated  - 2-3 x daily - 3 reps - 15 hold - Stretch thumb downward   - 2-3 x daily - 3-5 reps - 15 sec hold - Stretch Thumb into  "C-Shape" (don't use other thumb)  - 2-3 x daily - 3-5 reps - 15 sec hold - Towel Roll Grip with Forearm in Neutral  - 2-3 x daily - 3-5 reps - 10 sec hold - Resisted Finger Abduction - Index and Middle  - 2-3 x daily - 5-10 reps - 2-3 sec hold - C-Strength (try using rubber band)   - 2-3 x daily - 5-10 reps - 2-3 sec hold    PATIENT EDUCATION: Education details: See tx section above for details  Person educated: Patient Education method: Verbal Instruction, Teach back, Handouts  Education comprehension: States and demonstrates understanding, Additional Education required    HOME EXERCISE PROGRAM: Access Code: [email or web address] URL: [email or web address] Date: 07/21/2022 Prepared by: Benito Mccreedy   GOALS: Goals reviewed with patient? Yes   SHORT TERM GOALS: (STG required if POC>30 days) Target Date: 07/31/22  Pt will obtain protective, custom orthotic. Goal status: TBD/PRN  2.  Pt will demo/state understanding of initial HEP to improve pain levels and prerequisite motion. Goal status: INITIAL   LONG TERM GOALS: Target Date: 09/04/22  Pt will improve functional ability by decreased impairment per Quick DASH assessment from 25% to 10% or better, for better quality of life. Goal status: INITIAL  2.  Pt will improve grip strength in Rt hand from 45lbs to at least 50lbs for functional use at home and in IADLs. Goal status: INITIAL  3.  Pt will improve A/ROM in Rt thumb MCP J from 44* to at least 50*, to have functional motion for tasks like  grasp.  Goal status: INITIAL  4.  Pt will decrease pain at worst from 10/10 in the night to 3/10 or better to have better sleep and occupational participation in daily roles. Goal status: INITIAL  ASSESSMENT:  CLINICAL IMPRESSION: Patient is a 72 y.o. female who was seen today for occupational therapy evaluation for bilateral thumb arthritis and pain and decreased functional mobility.  She will benefit from outpatient  occupational therapy to decrease symptoms and improve function.   PERFORMANCE DEFICITS: in functional skills including ADLs, IADLs, ROM, strength, pain, flexibility, Fine motor  control, endurance, and UE functional use, cognitive skills including problem solving and safety awareness, and psychosocial skills including coping strategies, environmental adaptation, and habits.   IMPAIRMENTS: are limiting patient from IADLs, rest and sleep, work, and leisure.   COMORBIDITIES: may have co-morbidities  that affects occupational performance. Patient will benefit from skilled OT to address above impairments and improve overall function.  MODIFICATION OR ASSISTANCE TO COMPLETE EVALUATION: No modification of tasks or assist necessary to complete an evaluation.  OT OCCUPATIONAL PROFILE AND HISTORY: Problem focused assessment: Including review of records relating to presenting problem.  CLINICAL DECISION MAKING: LOW - limited treatment options, no task modification necessary  REHAB POTENTIAL: Excellent  EVALUATION COMPLEXITY: Low      PLAN:  OT FREQUENCY: 1-2x/week  OT DURATION: 6 weeks (through 09/04/22 as needed)   PLANNED INTERVENTIONS: self care/ADL training, therapeutic exercise, therapeutic activity, manual therapy, passive range of motion, splinting, fluidotherapy, compression bandaging, moist heat, cryotherapy, contrast bath, patient/family education, energy conservation, coping strategies training, and DME and/or AE instructions  RECOMMENDED OTHER SERVICES: none now   CONSULTED AND AGREED WITH PLAN OF CARE: Patient  PLAN FOR NEXT SESSION:  Review home exercise program and recommendations, check stretches and finish remainder of stable C program.  Benito Mccreedy, OTR/L, CHT 07/21/2022, 4:54 PM

## 2022-07-19 ENCOUNTER — Other Ambulatory Visit (HOSPITAL_BASED_OUTPATIENT_CLINIC_OR_DEPARTMENT_OTHER): Payer: Self-pay | Admitting: Obstetrics & Gynecology

## 2022-07-21 ENCOUNTER — Ambulatory Visit: Payer: Medicare Other | Admitting: Rehabilitative and Restorative Service Providers"

## 2022-07-21 ENCOUNTER — Other Ambulatory Visit: Payer: Self-pay

## 2022-07-21 ENCOUNTER — Encounter: Payer: Self-pay | Admitting: Rehabilitative and Restorative Service Providers"

## 2022-07-21 DIAGNOSIS — M25541 Pain in joints of right hand: Secondary | ICD-10-CM

## 2022-07-21 DIAGNOSIS — M25642 Stiffness of left hand, not elsewhere classified: Secondary | ICD-10-CM

## 2022-07-21 DIAGNOSIS — M6281 Muscle weakness (generalized): Secondary | ICD-10-CM

## 2022-07-21 DIAGNOSIS — M25542 Pain in joints of left hand: Secondary | ICD-10-CM | POA: Diagnosis not present

## 2022-07-21 DIAGNOSIS — M25641 Stiffness of right hand, not elsewhere classified: Secondary | ICD-10-CM | POA: Diagnosis not present

## 2022-07-27 NOTE — Therapy (Signed)
OUTPATIENT OCCUPATIONAL THERAPY TREATMENT NOTE  Patient Name: Jodi [name] MRN: [id number] DOB:[birth year], 72 y.o., female Today's Date: 07/30/2022  PCP: Carol Ada, MD REFERRING PROVIDER:  Gregor Hams, MD    END OF SESSION:  OT End of Session - 07/30/22 1334     Visit Number 2    Number of Visits 8    Date for OT Re-Evaluation 09/04/22    Authorization Type [employer] Medicare    Progress Note Due on Visit 10    OT Start Time 1340    OT Stop Time 1427    OT Time Calculation (min) 47 min    Activity Tolerance Patient tolerated treatment well;No increased pain;Patient limited by fatigue    Behavior During Therapy [facility] for tasks assessed/performed              Past Medical History:  Diagnosis Date   Abnormal Pap smear of cervix    Anxiety    Aortic atherosclerosis ([place])    Atypical chest pain 10/08/2020   Symptoms are very atypical.  She feels good with exercise and notes that in general she is feeling better since she started the exercise program.  She had a normal stress test in 2021.  No repeat ischemia testing advised.  She does also report difficulty and pain with swallowing with referred pain into her back.  She has GI follow-up.   Diverticulitis    Fatty liver disease, nonalcoholic    Glaucoma    H. pylori infection    H/O dizziness    Hemorrhoids    History of depression    incest survivor/dysthymia   Hypertension    Hyperthyroidism    Hypokalemia 04/29/2020   IBS (irritable bowel syndrome)    lactose intolerance   OSA (obstructive sleep apnea) 11/17/2012   Palpitations 04/29/2020   Restless leg syndrome    Sleep apnea    Snoring 11/17/2012   Thyroid disease    hypothyroid   Varicose veins    Past Surgical History:  Procedure Laterality Date   CHOLECYSTECTOMY  07/19/2017   Dr. 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   Cholelithiasis without obstruction 11/08/2020   Attention deficit hyperactivity disorder 11/08/2020   Abnormal gait 11/08/2020   Spider veins 11/23/2019   Edema of both legs 11/23/2019   CIN III (cervical intraepithelial neoplasia grade III) with severe dysplasia 03/05/2017   Anxiety disorder 01/04/2015   Essential (primary) hypertension 01/04/2015   Hypothyroidism 01/04/2015   Pure hypercholesterolemia 01/04/2015   Right upper quadrant pain 01/01/2015   Vertigo, labyrinthine 11/17/2012   OSA (obstructive sleep apnea) 11/17/2012    ONSET DATE: acute on chronic b/l wrist/thumb pain  REFERRING DIAG:  M25.531,M25.532 (ICD-10-CM) - Bilateral wrist pain  M79.641,M79.642 (ICD-10-CM) - Pain in both hands    THERAPY DIAG:  Muscle weakness (generalized)  Pain in joint of right hand  Stiffness of right hand, not elsewhere  classified  Pain in joint of left hand  Stiffness of left hand, not elsewhere classified  Rationale for Evaluation and Treatment: Rehabilitation  PERTINENT HISTORY: Per MD: "Evaluate and treat. 2-3 times per week for 4-6 weeks. Modalities may include iontophoresis, phonophoresis, stim." "Pt is a 66 female c/o bilat wrist pain, R>L x years. Pt c/o the R wrist started [worsening] over the past few weeks. Pt has started to work out and wonders if the pain is overuse. 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 Pt locates pain to R 1st MCP joint w/ shooting  pain up into the thumb. Pain is in the same location on the L, but not as severe. Pt notes deformity in all of her fingers.  "  Per OT Eval: "She is retired Immunologist, former caregiver.  She states her Lt hand/thumb pain started before her Rt, but the Rt hurts her worse. These things have bothered her for many years. She states a recent weight routine change may have flared her up.  He states her right thumb CMC J now wakes her up at night, which is why she is seeking help now."   PRECAUTIONS: None;  WEIGHT BEARING RESTRICTIONS: No   SUBJECTIVE:   SUBJECTIVE STATEMENT:  She states feeling a bit sore from HEP, but also no sharp pains, like she had been having.    PAIN:  Are you having pain? Yes a bit sore Rating: 2/10 at rest now  PATIENT GOALS: To decrease pain in the right hand primarily to sleep better at night and have better function.   OBJECTIVE: (All objective assessments below are from initial evaluation on: 07/21/22 unless otherwise specified.)    HAND DOMINANCE: Right   ADLs: Overall ADLs: States decreased ability to grab, hold household objects, pain and inability to open containers, perform FMS tasks (manipulate fasteners on clothing)   FUNCTIONAL OUTCOME MEASURES: Eval: Quck DASH 25% impairment today  (Higher % Score  =  More Impairment)    UPPER EXTREMITY ROM     Shoulder to Wrist AROM Right eval Left eval  Wrist flexion 63 68  Wrist extension 65 57  (Blank rows = not tested)   Hand AROM Right eval Left eval  Full Fist Ability (or Gap to Distal Palmar Crease) Full (IF a bit stiff) Full  Thumb Opposition  (Kapandji Scale)  9 9  Thumb MCP (0-60) 44 54  Thumb IP (0-80) 57 66  Thumb Radial Abduction Span 6.5cm  6cm  Thumb Palmar Abduction Span 6cm span  6cm  (Blank rows = not tested)   UPPER EXTREMITY MMT:    Eval: She had at least 4 / 5 MMT strength in bilateral wrists/hands in all planes.  Isometric strength was not very painful to  her, only moving strength-which is typical with arthritis  HAND FUNCTION: Eval: Observed weakness in more affected Rt hand.  Grip strength Right: 45 lbs, Left: 49 lbs   OBSERVATIONS:   Eval: Some overt evidence of Heberden's nodes and Bouchard's nodes, some bulkiness around the thumb joints from arthritis.  Pain increased with motion but fairly strong and stable isometrically.  Interestingly her pain reports her worst in the night, but that could be due to lack of stimuli to distract her from her pain or a symptom of overuse from the days activities.   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  TODAY'S TREATMENT:  07/30/22: OT reviews known HEP with her while she is on [place] b/l for 3 mins. OT then educates on remaining exercises and performs with her on Lt hand. She does teach back, doing exercises with Rt hand. She tolerates wel without pain and states understanding for the most part.  No pain at end of session.   Exercises/activities - Seated Wrist Flexion Stretch  - 3 x daily - 3 reps - 15 hold - Wrist Prayer Stretch  - 3 x daily - 3 reps - 15 sec hold - Stretch Thumb DOWNWARD  - 3 x daily - 3 reps - 15 sec hold - Thumb Webspace Stretch  - 3 x daily - 3 reps - 15 sec hold - Towel Roll Grip with Forearm in Neutral  - 3 x daily - 5 reps - 10 sec hold - Spread Index Finger Apart  - 3 x daily - 5 reps - 5 sec hold - C-Strength (try using rubber band)   - 3 x daily - 5 reps - 5 sec hold - HOOK Stretch  - 3 x daily - 3 reps - 15-20 sec hold   Post-evaluation treatment: She was educated on bracing options for her new exercise program-exercise gloves with wrist strap component to support the hands.  She was also educated to avoid repetitious use of hands if able, use modalities like heat to loosen hand which helped her today.  She was then educated on the following home exercise program but only the first 2 wrist stretches could be done in today's timeframe.  She states feeling better  at the end of the session than at the start of the session.  She will need review of these exercises and to complete this home exercise program in the next 1-2 visits.  Exercises - Seated Wrist Flexion Stretch  - 2-3 x daily - 3 reps - 15 hold - Wrist Extension Stretch Pronated  - 2-3 x daily - 3 reps - 15 hold - Stretch thumb downward   - 2-3 x daily - 3-5 reps - 15 sec hold - Stretch Thumb into "C-Shape" (don't use other thumb)  - 2-3 x daily - 3-5 reps - 15 sec hold - Towel Roll Grip with Forearm in Neutral  - 2-3 x daily - 3-5 reps - 10 sec hold - Resisted Finger Abduction - Index and Middle  - 2-3 x daily - 5-10 reps - 2-3 sec hold - C-Strength (try using rubber band)   - 2-3 x daily - 5-10 reps - 2-3 sec hold    PATIENT EDUCATION: Education details: See tx section above for details  Person educated: Patient Education method: Verbal Instruction, Teach back, Handouts  Education comprehension: States and demonstrates understanding, Additional Education required    HOME EXERCISE PROGRAM: Access Code: [email or web address] URL: [email or web address] Date: 07/30/2022 Prepared by: Benito Mccreedy   GOALS: Goals reviewed with patient? Yes   SHORT TERM GOALS: (STG required if POC>30 days) Target Date: 07/31/22  Pt will obtain protective, custom orthotic. Goal status: TBD/PRN  2.  Pt will demo/state understanding of initial HEP to improve pain levels and prerequisite motion. Goal status: INITIAL   LONG TERM GOALS: Target Date: 09/04/22  Pt will improve functional ability by decreased impairment per Quick DASH assessment from 25% to 10% or better, for better quality of life. Goal status: INITIAL  2.  Pt will improve grip strength in Rt hand from 45lbs to at least 50lbs for functional use at home and  in IADLs. Goal status: INITIAL  3.  Pt will improve A/ROM in Rt thumb MCP J from 44* to at least 50*, to have functional motion for tasks like  grasp.  Goal status:  INITIAL  4.  Pt will decrease pain at worst from 10/10 in the night to 3/10 or better to have better sleep and occupational participation in daily roles. Goal status: INITIAL  ASSESSMENT:  CLINICAL IMPRESSION: 07/30/22: Doing very well and may have an early d/c if all goes well in next 1-2 weeks.   Eval: Patient is a 72 y.o. female who was seen today for occupational therapy evaluation for bilateral thumb arthritis and pain and decreased functional mobility.  She will benefit from outpatient occupational therapy to decrease symptoms and improve function.    PLAN:  OT FREQUENCY: 1-2x/week  OT DURATION: 6 weeks (through 09/04/22 as needed)   PLANNED INTERVENTIONS: self care/ADL training, therapeutic exercise, therapeutic activity, manual therapy, passive range of motion, splinting, fluidotherapy, compression bandaging, moist heat, cryotherapy, contrast bath, patient/family education, energy conservation, coping strategies training, and DME and/or AE instructions  CONSULTED AND AGREED WITH PLAN OF CARE: Patient  PLAN FOR NEXT SESSION:  Check full HEP and recommendations.   Benito Mccreedy, OTR/L, CHT 07/30/2022, 2:31 PM

## 2022-07-30 ENCOUNTER — Encounter: Payer: Self-pay | Admitting: Rehabilitative and Restorative Service Providers"

## 2022-07-30 ENCOUNTER — Ambulatory Visit: Payer: Medicare Other | Admitting: Rehabilitative and Restorative Service Providers"

## 2022-07-30 DIAGNOSIS — M25642 Stiffness of left hand, not elsewhere classified: Secondary | ICD-10-CM | POA: Diagnosis not present

## 2022-07-30 DIAGNOSIS — M25541 Pain in joints of right hand: Secondary | ICD-10-CM | POA: Diagnosis not present

## 2022-07-30 DIAGNOSIS — M25641 Stiffness of right hand, not elsewhere classified: Secondary | ICD-10-CM

## 2022-07-30 DIAGNOSIS — M25542 Pain in joints of left hand: Secondary | ICD-10-CM

## 2022-07-30 DIAGNOSIS — M6281 Muscle weakness (generalized): Secondary | ICD-10-CM

## 2022-07-30 NOTE — Progress Notes (Signed)
Cardiology Office Note:    Date:  07/31/2022   ID:  Jodi [name], DOB 13-Nov-1950, MRN [id number]  PCP:  [facility], [place] [facility] Cardiologist:  Skeet Latch, MD     Referring MD: [facility], MD   Chief Complaint  Patient presents with   follow up for HTN    Seen for Dr. Oval Linsey    History of Present Illness:    Jodi [name] is a 72 y.o. female with a hx of hypertension, OSA (unable to tolerate CPAP), anxiety, palpitations.  Initially establish care with our practice in 2021 for palpitations.  She had an exercise tolerance test in 2021 that was normal.  She wore a 7-day event monitor in November 2021 that was overall reassuring, sinus rhythms with PACs and sinus tachycardia were noted.  She was last evaluated in our office with Dr. Oval Linsey on 06/01/2022, at that time she was doing well.  She was no longer experiencing palpitations and she felt that was contributed to her anxiety being overall improved.  Blood pressure was marginally elevated, this was felt to be a combination of whitecoat syndrome in addition to essential hypertension.  Her weight had been creeping up some as well.  She was working with a psychiatrist and would soon be starting on guanfacine, which would help with her blood pressure as well.  No new medications were started at this visit.  Her LDL was 135 on 11/18/2021 however she wanted to work on lifestyle changes first before starting on a statin.  She presents today for follow-up of her hypertension.  Overall, she says she is doing well.  She is not as worried with her health as she has been in the past, and her palpitations are better as well.  [facility] continues with the exercises she learned in the prep program, on the other days of the week she typically tries to walk.  She is slightly frustrated that she does not seem to be losing weight, in fact she has gained around 15 pounds that she  cannot account for.  Initial blood pressure in the office was 142/84, rechecked after 10 minutes it was 150/82.  We reviewed that she has worn the 24-hour blood pressure monitor in the past and that reveals that even outside of the office her blood pressure is not optimal.  Jodi [name] feels very confident that her elevated readings are related to whitecoat syndrome.  She would also like to try to continue to work on her diet and exercise.  She did agree to check her blood pressure at home once a day for 2 weeks and report these elevated readings, knowing that if they continue to be elevated we will need to start her on something for better blood pressure control. She denies chest pain, palpitations, dyspnea, pnd, orthopnea, n, v, dizziness, syncope, weight gain, or early satiety. She does have pedal edema, but it is overall largely improved for her, states it typically resolves over night.    Past Medical History:  Diagnosis Date   Abnormal Pap smear of cervix    Anxiety    Aortic atherosclerosis ([place])    Atypical chest pain 10/08/2020   Symptoms are very atypical.  She feels good with exercise and notes that in general she is feeling better since she started the exercise program.  She had a normal stress test in 2021.  No repeat ischemia testing advised.  She does also report difficulty and pain with swallowing with referred pain into her back.  She has GI follow-up.   Diverticulitis    Fatty liver disease, nonalcoholic    Glaucoma    H. pylori infection    H/O dizziness    Hemorrhoids    History of depression    incest survivor/dysthymia   Hypertension    Hyperthyroidism    Hypokalemia 04/29/2020   IBS (irritable bowel syndrome)    lactose intolerance   OSA (obstructive sleep apnea) 11/17/2012   Palpitations 04/29/2020   Restless leg syndrome    Sleep apnea    Snoring 11/17/2012   Thyroid disease    hypothyroid   Varicose veins     Past Surgical History:  Procedure Laterality Date    CHOLECYSTECTOMY  07/19/2017   Dr. Donne Hazel   COLPOSCOPY  99, 00, 02, 04, 06   ENDOVENOUS ABLATION SAPHENOUS VEIN W/ LASER Right 04-08-2015   endovenous laser ablation (right greater saphenous vein) by Victorino Dike MD     LEEP  3/09   CIN II/III    Current Medications: Current Meds  Medication Sig   ALPRAZolam (XANAX) 0.25 MG tablet Take 0.125 mg by mouth every 6 (six) hours as needed for anxiety.   estradiol (ESTRACE) 0.1 MG/GM vaginal cream INSERT 1 APPLICATORFUL VAGINALLY 2 TIMES A WEEK   fluticasone (FLONASE) 50 MCG/ACT nasal spray Place 2 sprays into both nostrils daily as needed for allergies.    lactase (LACTAID) 3000 units tablet Take 3,000 Units by mouth as needed (lactaid).    levothyroxine (SYNTHROID) 112 MCG tablet Take 112 mcg by mouth daily.   Magnesium 200 MG TABS Take 200 mg by mouth daily.   Multiple Vitamin (MULTIVITAMIN WITH MINERALS) TABS Take 1 tablet by mouth daily.   saccharomyces boulardii (FLORASTOR) 250 MG capsule Take one tablet BID x 1 month   timolol (TIMOPTIC) 0.5 % ophthalmic solution Place 1 drop into both eyes every morning.     Allergies:   Sulfites, Amlodipine, Iodine, Macrodantin [nitrofurantoin macrocrystal], Serotonin reuptake inhibitors (ssris), Shellfish allergy, Venlafaxine, Almond oil, and Bentyl [dicyclomine hcl]   Social History   Socioeconomic History   Marital status: Married    Spouse name: Not on file   Number of children: 1   Years of education: Not on file   Highest education level: Not on file  Occupational History   Not on file  Tobacco Use   Smoking status: Former    Types: Cigarettes    Quit date: 09/21/1978    Years since quitting: 43.8   Smokeless tobacco: Never   Tobacco comments:    quit 30 years ago  Vaping Use   Vaping Use: Never used  Substance and Sexual Activity   Alcohol use: Not Currently    Alcohol/week: 0.0 standard drinks of alcohol   Drug use: No   Sexual activity: Not Currently    Partners: Male   Other Topics Concern   Not on file  Social History Narrative   Not on file   Social Determinants of Health   Financial Resource Strain: Not on file  Food Insecurity: Not on file  Transportation Needs: Not on file  Physical Activity: Not on file  Stress: Not on file  Social Connections: Not on file     Family History: The patient's family history includes Breast cancer in her cousin; Colon polyps in her brother and father; Dementia in her brother, father, and mother; Diabetes in her brother, father, and paternal  grandmother; Heart disease in her maternal uncle; Hyperlipidemia in her father; Hypertension in her father and mother; Parkinson's disease in her mother; Prostate cancer in her father; Seizures in her brother. There is no history of Colon cancer, Esophageal cancer, Stomach cancer, Liver disease, or Pancreatic cancer.  ROS:   Please see the history of present illness.    All other systems reviewed and are negative.  EKGs/Labs/Other Studies Reviewed:    The following studies were reviewed today:  12/16/21 echo complete -EF 60 to 65%, no RWMA, grade 1 DD, trivial MR, trivial AR, AV sclerosis is present with out stenosis.  09/01/2021 24-hour blood pressure monitor - Overall BP Range: 100-172/56-100, stage I hypertension  05/16/2020 7-day monitor -predominant rhythm is sinus rhythm, PACs and sinus tachycardia were noted  05/01/2020 exercise tolerance test -negative for ischemia  04/23/2020 CTA chest -aortic atherosclerosis   EKG:  EKG is not ordered today.    Recent Labs: 08/27/2021: TSH 3.000 11/18/2021: ALT 21; BUN 7; Creatinine, Ser 0.65; Potassium 4.3; Sodium 143  Recent Lipid Panel    Component Value Date/Time   CHOL 218 (H) 11/18/2021 1001   TRIG 139 11/18/2021 1001   HDL 58 11/18/2021 1001   CHOLHDL 3.8 11/18/2021 1001   LDLCALC 135 (H) 11/18/2021 1001     Risk Assessment/Calculations:      HYPERTENSION CONTROL Vitals:   07/31/22 1034 07/31/22 1130   BP: (!) 142/84 (!) 150/82    The patient's blood pressure is elevated above target today.  In order to address the patient's elevated BP: The blood pressure is usually elevated in clinic.  Blood pressures monitored at home have been optimal.; Blood pressure will be monitored at home to determine if medication changes need to be made.            Physical Exam:    VS:  BP (!) 150/82   Pulse 93   Ht 5' 5.5" (1.664 m)   Wt 208 lb (94.3 kg)   LMP 07/06/2010   BMI 34.09 kg/m     Wt Readings from Last 3 Encounters:  07/31/22 208 lb (94.3 kg)  07/14/22 209 lb (94.8 kg)  06/25/22 208 lb (94.3 kg)     GEN:  Well nourished, well developed in no acute distress HEENT: Normal NECK: No JVD; No carotid bruits LYMPHATICS: No lymphadenopathy CARDIAC: RRR, no murmurs, rubs, gallops RESPIRATORY:  Clear to auscultation without rales, wheezing or rhonchi  ABDOMEN: Soft, non-tender, non-distended MUSCULOSKELETAL:  trace edema; No deformity  SKIN: Warm and dry NEUROLOGIC:  Alert and oriented x 3 PSYCHIATRIC:  Normal affect   ASSESSMENT:    1. Essential hypertension   2. Pure hypercholesterolemia   3. Hypothyroidism, unspecified type    PLAN:    In order of problems listed above:  Hypertension -blood pressure in office 142/84, recheck was 150/82.  Patient insist that she has some degree of white coat hypertension, and would like some more time before agreeing to start a medication.  She will check her blood pressure once a day for the next 2 weeks in send [place] a log of her blood pressure readings.  Reiterated sodium restriction, and weight loss.  If they remain elevated, we will start spironolactone (amlodipine cause IBS flare, coreg caused fatigue, HCTZ caused hypokalemia).  Will check cmet in anticipation of starting spironolactone.  Hypercholesteremia -LDL on 11/18/2021 was 135, she has been working on dietary and lifestyle changes, she is not fasting today.  Will place orders for fasting  lipids  and check cmet.  Hypothyroidism -she endorses continued weight gain and spite of efforts to lose weight.  Will check a thyroid panel today.  Disposition - BP log x 2 weeks. Orders for CMET, lipids and TSH. If her BP remains elevated, start Spironolactone 25 mg daily, then return for BMET in 1 week. If lipids are elevated, start Rosuvastatin 10 mg daily.  Return in 3 months.           Medication Adjustments/Labs and Tests Ordered: Current medicines are reviewed at length with the patient today.  Concerns regarding medicines are outlined above.  Orders Placed This Encounter  Procedures   Comprehensive metabolic panel   Lipid panel   Thyroid Panel With TSH   No orders of the defined types were placed in this encounter.   Patient Instructions  Medication Instructions:  Continue your current medications.   *If you need a refill on your cardiac medications before your next appointment, please call your pharmacy*   Lab Work: Your physician recommends that you return for lab work in the next week for fasting lipid panel, CMP, thyroid panel  Please return for Lab work. You may come to the...   [facility] (3rd floor) [address] [place], [place] [address]  Open: 8am-Noon and 1pm-4:30pm  Please ring the doorbell on the small table when you exit the elevator and the Lab Tech will come get you  [place] at [facility] [address] [place], [place], [place] [address] Open: 8am-1pm, then 2pm-4:30pm   [place]- Please see attached locations sheet stapled to your lab work with address and hours.   If you have labs (blood work) drawn today and your tests are completely normal, you will receive your results only by: [place] (if you have MyChart) OR A paper copy in the mail If you have any lab test that is abnormal or we need to change your treatment, we will call you to review the results.  Follow-Up: At [facility], you and your health needs are our priority.  As part of our continuing mission to provide you with exceptional heart care, we have created designated Provider Care Teams.  These Care Teams include your primary Cardiologist (physician) and Advanced Practice Providers (APPs -  Physician Assistants and Nurse Practitioners) who all work together to provide you with the care you need, when you need it.  We recommend signing up for the patient portal called "MyChart".  Sign up information is provided on this After Visit Summary.  MyChart is used to connect with patients for Virtual Visits (Telemedicine).  Patients are able to view lab/test results, encounter notes, upcoming appointments, etc.  Non-urgent messages can be sent to your provider as well.   To learn more about what you can do with MyChart, go to [email or web address].    Your next appointment:   3 month(s)  Provider:   Skeet Latch, MD or Laurann Montana, NP    Other Instructions   If your blood pressure is consistently more than 130/80 at home we will plan to start Spironolactone for blood pressure.   Tips to Measure your Blood Pressure Correctly  Check blood pressure once per day and keep a log.   To determine whether you have hypertension, a medical professional will take a blood pressure reading. How you prepare for the test, the position of your arm, and other factors can change a blood pressure reading by 10% or more. That could be enough to  hide high blood pressure, start you on a drug you don't really need, or lead your doctor to incorrectly adjust your medications.  National and international guidelines offer specific instructions for measuring blood pressure. If a doctor, nurse, or medical assistant isn't doing it right, don't hesitate to ask him or her to get with the guidelines.  Here's what you can do to ensure a correct reading:  Don't drink a caffeinated beverage or smoke during the 30 minutes before the  test.  Sit quietly for five minutes before the test begins.  During the measurement, sit in a chair with your feet on the floor and your arm supported so your elbow is at about heart level.  The inflatable part of the cuff should completely cover at least 80% of your upper arm, and the cuff should be placed on bare skin, not over a shirt.  Don't talk during the measurement.  Have your blood pressure measured twice, with a brief break in between. If the readings are different by 5 points or more, have it done a third time.  In 2017, new guidelines from the [place], the [employer] of Cardiology, and nine other health organizations lowered the diagnosis of high blood pressure to 130/80 mm Hg or higher for all adults. The guidelines also redefined the various blood pressure categories to now include normal, elevated, Stage 1 hypertension, Stage 2 hypertension, and hypertensive crisis (see "Blood pressure categories").  Blood pressure categories  Blood pressure category SYSTOLIC (upper number)  DIASTOLIC (lower number)  Normal Less than 120 mm Hg and Less than 80 mm Hg  Elevated 120-129 mm Hg and Less than 80 mm Hg  High blood pressure: Stage 1 hypertension 130-139 mm Hg or 80-89 mm Hg  High blood pressure: Stage 2 hypertension 140 mm Hg or higher or 90 mm Hg or higher  Hypertensive crisis (consult your doctor immediately) Higher than 180 mm Hg and/or Higher than 120 mm Hg  Source: American Heart Association and American Stroke Association. For more on getting your blood pressure under control, buy Controlling Your Blood Pressure, a Special Health Report from [facility].   Blood Pressure Log   Date   Time  Blood Pressure  Example: Nov 1 9 AM 124/78                                                Signed, Trudi Ida, NP  07/31/2022 3:58 PM    [place]

## 2022-07-31 ENCOUNTER — Encounter (HOSPITAL_BASED_OUTPATIENT_CLINIC_OR_DEPARTMENT_OTHER): Payer: Self-pay | Admitting: Cardiology

## 2022-07-31 ENCOUNTER — Ambulatory Visit (HOSPITAL_BASED_OUTPATIENT_CLINIC_OR_DEPARTMENT_OTHER): Payer: Medicare Other | Admitting: Cardiology

## 2022-07-31 VITALS — BP 150/82 | HR 93 | Ht 65.5 in | Wt 208.0 lb

## 2022-07-31 DIAGNOSIS — I10 Essential (primary) hypertension: Secondary | ICD-10-CM

## 2022-07-31 DIAGNOSIS — E7800 Pure hypercholesterolemia, unspecified: Secondary | ICD-10-CM

## 2022-07-31 DIAGNOSIS — E039 Hypothyroidism, unspecified: Secondary | ICD-10-CM

## 2022-07-31 NOTE — Patient Instructions (Signed)
Medication Instructions:  Continue your current medications.   *If you need a refill on your cardiac medications before your next appointment, please call your pharmacy*   Lab Work: Your physician recommends that you return for lab work in the next week for fasting lipid panel, CMP, thyroid panel  Please return for Lab work. You may come to the...   [facility] (3rd floor) [address] [place], [place] [address]  Open: 8am-Noon and 1pm-4:30pm  Please ring the doorbell on the small table when you exit the elevator and the Lab Tech will come get you  [place] at [facility] [address] [place], [place], [place] [address] Open: 8am-1pm, then 2pm-4:30pm   [place]- Please see attached locations sheet stapled to your lab work with address and hours.   If you have labs (blood work) drawn today and your tests are completely normal, you will receive your results only by: [place] (if you have MyChart) OR A paper copy in the mail If you have any lab test that is abnormal or we need to change your treatment, we will call you to review the results.  Follow-Up: At [facility], you and your health needs are our priority.  As part of our continuing mission to provide you with exceptional heart care, we have created designated Provider Care Teams.  These Care Teams include your primary Cardiologist (physician) and Advanced Practice Providers (APPs -  Physician Assistants and Nurse Practitioners) who all work together to provide you with the care you need, when you need it.  We recommend signing up for the patient portal called "MyChart".  Sign up information is provided on this After Visit Summary.  MyChart is used to connect with patients for Virtual Visits (Telemedicine).  Patients are able to view lab/test results, encounter notes, upcoming appointments, etc.  Non-urgent messages can be sent to your provider as well.   To learn more  about what you can do with MyChart, go to [email or web address].    Your next appointment:   3 month(s)  Provider:   Skeet Latch, MD or Laurann Montana, NP    Other Instructions   If your blood pressure is consistently more than 130/80 at home we will plan to start Spironolactone for blood pressure.   Tips to Measure your Blood Pressure Correctly  Check blood pressure once per day and keep a log.   To determine whether you have hypertension, a medical professional will take a blood pressure reading. How you prepare for the test, the position of your arm, and other factors can change a blood pressure reading by 10% or more. That could be enough to hide high blood pressure, start you on a drug you don't really need, or lead your doctor to incorrectly adjust your medications.  National and international guidelines offer specific instructions for measuring blood pressure. If a doctor, nurse, or medical assistant isn't doing it right, don't hesitate to ask him or her to get with the guidelines.  Here's what you can do to ensure a correct reading:  Don't drink a caffeinated beverage or smoke during the 30 minutes before the test.  Sit quietly for five minutes before the test begins.  During the measurement, sit in a chair with your feet on the floor and your arm supported so your elbow is at about heart level.  The inflatable part of the cuff should completely cover at least 80% of your upper arm, and the cuff should be  placed on bare skin, not over a shirt.  Don't talk during the measurement.  Have your blood pressure measured twice, with a brief break in between. If the readings are different by 5 points or more, have it done a third time.  In 2017, new guidelines from the [place], the [employer] of Cardiology, and nine other health organizations lowered the diagnosis of high blood pressure to 130/80 mm Hg or higher for all adults. The guidelines also redefined  the various blood pressure categories to now include normal, elevated, Stage 1 hypertension, Stage 2 hypertension, and hypertensive crisis (see "Blood pressure categories").  Blood pressure categories  Blood pressure category SYSTOLIC (upper number)  DIASTOLIC (lower number)  Normal Less than 120 mm Hg and Less than 80 mm Hg  Elevated 120-129 mm Hg and Less than 80 mm Hg  High blood pressure: Stage 1 hypertension 130-139 mm Hg or 80-89 mm Hg  High blood pressure: Stage 2 hypertension 140 mm Hg or higher or 90 mm Hg or higher  Hypertensive crisis (consult your doctor immediately) Higher than 180 mm Hg and/or Higher than 120 mm Hg  Source: American Heart Association and American Stroke Association. For more on getting your blood pressure under control, buy Controlling Your Blood Pressure, a Special Health Report from [facility].   Blood Pressure Log   Date   Time  Blood Pressure  Example: Nov 1 9 AM 124/78

## 2022-08-03 NOTE — Therapy (Signed)
OUTPATIENT OCCUPATIONAL THERAPY TREATMENT NOTE  Patient Name: Jodi [name] MRN: [id number] DOB:[birth year], 72 y.o., female Today's Date: 08/04/2022  PCP: Carol Ada, MD REFERRING PROVIDER:  Gregor Hams, MD    END OF SESSION:  OT End of Session - 08/04/22 0850     Visit Number 3    Number of Visits 8    Date for OT Re-Evaluation 09/04/22    Authorization Type [employer] Medicare    Progress Note Due on Visit 10    OT Start Time 0850    OT Stop Time 0930    OT Time Calculation (min) 40 min    Activity Tolerance Patient tolerated treatment well;No increased pain;Patient limited by fatigue;Patient limited by pain    Behavior During Therapy [facility] for tasks assessed/performed               Past Medical History:  Diagnosis Date   Abnormal Pap smear of cervix    Anxiety    Aortic atherosclerosis ([place])    Atypical chest pain 10/08/2020   Symptoms are very atypical.  She feels good with exercise and notes that in general she is feeling better since she started the exercise program.  She had a normal stress test in 2021.  No repeat ischemia testing advised.  She does also report difficulty and pain with swallowing with referred pain into her back.  She has GI follow-up.   Diverticulitis    Fatty liver disease, nonalcoholic    Glaucoma    H. pylori infection    H/O dizziness    Hemorrhoids    History of depression    incest survivor/dysthymia   Hypertension    Hyperthyroidism    Hypokalemia 04/29/2020   IBS (irritable bowel syndrome)    lactose intolerance   OSA (obstructive sleep apnea) 11/17/2012   Palpitations 04/29/2020   Restless leg syndrome    Sleep apnea    Snoring 11/17/2012   Thyroid disease    hypothyroid   Varicose veins    Past Surgical History:  Procedure Laterality Date   CHOLECYSTECTOMY  07/19/2017   Dr. 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   Cholelithiasis without obstruction 11/08/2020   Attention deficit hyperactivity disorder 11/08/2020   Abnormal gait 11/08/2020   Spider veins 11/23/2019   Edema of both legs 11/23/2019   CIN III (cervical intraepithelial neoplasia grade III) with severe dysplasia 03/05/2017   Anxiety disorder 01/04/2015   Essential (primary) hypertension 01/04/2015   Hypothyroidism 01/04/2015   Pure hypercholesterolemia 01/04/2015   Right upper quadrant pain 01/01/2015   Vertigo, labyrinthine 11/17/2012   OSA (obstructive sleep apnea) 11/17/2012    ONSET DATE: acute on chronic b/l wrist/thumb pain  REFERRING DIAG:  M25.531,M25.532 (ICD-10-CM) - Bilateral wrist pain  M79.641,M79.642 (ICD-10-CM) - Pain in both hands    THERAPY DIAG:  Stiffness of right hand, not elsewhere classified  Pain in joint of right  hand  Muscle weakness (generalized)  Pain in joint of left hand  Stiffness of left hand, not elsewhere classified  Rationale for Evaluation and Treatment: Rehabilitation  PERTINENT HISTORY: Per MD: "Evaluate and treat. 2-3 times per week for 4-6 weeks. Modalities may include iontophoresis, phonophoresis, stim." "Pt is a 71 female c/o bilat wrist pain, R>L x years. Pt c/o the R wrist started [worsening] over the past few weeks. Pt has started to work out and wonders if the pain is overuse. 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 Pt locates pain to R 1st  MCP joint w/ shooting pain up into the thumb. Pain is in the same location on the L, but not as severe. Pt notes deformity in all of her fingers.  "  Per OT Eval: "She is retired Immunologist, former caregiver.  She states her Lt hand/thumb pain started before her Rt, but the Rt hurts her worse. These things have bothered her for many years. She states a recent weight routine change may have flared her up.  He states her right thumb CMC J now wakes her up at night, which is why she is seeking help now."   PRECAUTIONS: None;  WEIGHT BEARING RESTRICTIONS: No   SUBJECTIVE:   SUBJECTIVE STATEMENT:  She states that her left thumb is no longer painful and has not been painful in the past week, but her right thumb feels worse now and more painful and she is not sure why.  She cannot clearly identify any home activities that is causing this pain increase, and she states its likely something she is doing during her self-care/gym routine.  She has some poor awareness of what exactly she is doing it hurts her and is therefore unsure why she has increased pain.   PAIN:  Are you having pain?  Yes a bit sore when moving today Rating: 0/10 at rest now, 3/10 when moving  PATIENT GOALS: To decrease pain in the right hand primarily to sleep better at night and have better function.   OBJECTIVE: (All objective assessments below are from initial evaluation on: 07/21/22 unless otherwise specified.)   HAND DOMINANCE: Right   ADLs: Overall ADLs: States decreased ability to grab, hold household objects, pain and inability to open containers, perform FMS tasks (manipulate fasteners on clothing)   FUNCTIONAL OUTCOME MEASURES: Eval: Quck DASH 25% impairment today  (Higher % Score  =  More Impairment)    UPPER EXTREMITY ROM     Shoulder to Wrist AROM Right eval Left eval  Wrist flexion 63 68  Wrist extension 65 57  (Blank rows = not tested)   Hand AROM Right eval Left eval  Full Fist  Ability (or Gap to Distal Palmar Crease) Full (IF a bit stiff) Full  Thumb Opposition  (Kapandji Scale)  9 9  Thumb MCP (0-60) 44 54  Thumb IP (0-80) 57 66  Thumb Radial Abduction Span 6.5cm  6cm  Thumb Palmar Abduction Span 6cm span  6cm  (Blank rows = not tested)   UPPER EXTREMITY MMT:    Eval: She had at least 4 / 5 MMT strength in bilateral wrists/hands in all planes.  Isometric strength was not very painful to her, only moving strength-which is typical with arthritis  HAND FUNCTION: Eval: Observed weakness in more affected Rt hand.  Grip strength Right: 45 lbs, Left: 49 lbs   OBSERVATIONS:  Eval: Some overt evidence of Heberden's nodes and Bouchard's nodes, some bulkiness around the thumb joints from arthritis.  Pain increased with motion but fairly strong and stable isometrically.  Interestingly her pain reports her worst in the night, but that could be due to lack of stimuli to distract her from her pain or a symptom of overuse from the days activities.   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   TODAY'S TREATMENT:  08/04/22: OT discusses functional tasks with her as well as gym routines for her self-care/safety maintenance.  She has some difficulty describing/remembering and explaining the exercises that she has done for the past 2 years at least 2-3 times a week.  She states having ADHD and that it is not a memory issue.  Eventually it is determined that the only pain she is having when doing her typical workout routines is with pushing activities like pushing overhead with a weight machine or holding elliptical and pushing forward with direct pressure on the base of her thumbs.  OT educates on modifications/adaptations for these activities and other home activities- that do not require direct pressure on the thumbs or advises her to wear padded gloves to prevent this direct pressure.  She states understanding and doing the modified waist does not bother her.   Additionally OT leads her through several other exercises including pulling bicep curls etc. that do not bother her as they do not cause a direct pressure on the thumb.  Additionally thumb isometrics are not painful as there is no motion produced.  OT advises her to monitor her own habits and try to increase her personal body awareness of when she is having pain and consider keeping a log or journal to document these things as she seems to have some problems describing them to myself and other healthcare providers.  Her home exercise program was also reviewed including wrist and thumb stretches and light strengthening with the "stable C protocol."  She states understanding these things and that they are not painful for her; she feels less pain/irritation after the session.    PATIENT EDUCATION: Education details: See tx section above for details  Person educated: Patient Education method: Verbal Instruction, Teach back, Handouts  Education comprehension: States and demonstrates understanding, Additional Education required    HOME EXERCISE PROGRAM: Access Code: [email or web address] URL: [email or web address]   GOALS: Goals reviewed with patient? Yes   SHORT TERM GOALS: (STG required if POC>30 days) Target Date: 07/31/22  Pt will obtain protective, custom orthotic. Goal status: TBD/PRN  2.  Pt will demo/state understanding of initial HEP to improve pain levels and prerequisite motion. Goal status: MET 08/04/22   LONG TERM GOALS: Target Date: 09/04/22  Pt will improve functional ability by decreased impairment per Quick DASH assessment from 25% to 10% or better, for better quality of life. Goal status: INITIAL  2.  Pt will improve grip strength in Rt hand from 45lbs to at least 50lbs for functional use at home and in IADLs. Goal status: INITIAL  3.  Pt will improve A/ROM in Rt thumb MCP J from 44* to at least 50*, to have functional motion for tasks like  grasp.  Goal status:  INITIAL  4.  Pt will decrease pain at worst from 10/10 in the night to 3/10 or better to have better sleep and occupational participation in daily roles. Goal status: INITIAL  ASSESSMENT:  CLINICAL IMPRESSION: 08/04/22: Despite her arrival in stating increased right thumb problems, she actually reports  less resting pain and seems to only be having issues when doing certain repetitive weighted exercises at the gym.  These things were modified and she should do better from now on.  She also has a great home exercise program for her thumbs which she states understanding.  OT asked her to "test the waters" with all daily and home functional activities trying to be more body aware of what is going on with her thumbs and joints, and return in the next session prepared for progress note to determine if she has any additional need for therapy services.   PLAN:  OT FREQUENCY: 1-2x/week  OT DURATION: 6 weeks (through 09/04/22 as needed)   PLANNED INTERVENTIONS: self care/ADL training, therapeutic exercise, therapeutic activity, manual therapy, passive range of motion, splinting, fluidotherapy, compression bandaging, moist heat, cryotherapy, contrast bath, patient/family education, energy conservation, coping strategies training, and DME and/or AE instructions  CONSULTED AND AGREED WITH PLAN OF CARE: Patient  PLAN FOR NEXT SESSION:  Address any remaining issues, discussed how adapting and modifying techniques at home and her workout/gym settings have affected her over the past week.  Consider discharge if all goals are met and she has no further needs for therapy.  Benito Mccreedy, OTR/L, CHT 08/04/2022, 6:01 PM

## 2022-08-04 ENCOUNTER — Ambulatory Visit: Payer: Medicare Other | Admitting: Rehabilitative and Restorative Service Providers"

## 2022-08-04 ENCOUNTER — Encounter: Payer: Self-pay | Admitting: Rehabilitative and Restorative Service Providers"

## 2022-08-04 DIAGNOSIS — M25641 Stiffness of right hand, not elsewhere classified: Secondary | ICD-10-CM | POA: Diagnosis not present

## 2022-08-04 DIAGNOSIS — M6281 Muscle weakness (generalized): Secondary | ICD-10-CM

## 2022-08-04 DIAGNOSIS — M25642 Stiffness of left hand, not elsewhere classified: Secondary | ICD-10-CM

## 2022-08-04 DIAGNOSIS — M25541 Pain in joints of right hand: Secondary | ICD-10-CM

## 2022-08-04 DIAGNOSIS — M25542 Pain in joints of left hand: Secondary | ICD-10-CM | POA: Diagnosis not present

## 2022-08-10 NOTE — Therapy (Signed)
OUTPATIENT OCCUPATIONAL THERAPY TREATMENT NOTE  Patient Name: Jodi [name] MRN: [id number] DOB:08/12/1950, 72 y.o., female Today's Date: 08/11/2022  PCP: Carol Ada, MD REFERRING PROVIDER:  Gregor Hams, MD    END OF SESSION:  OT End of Session - 08/11/22 1153     Visit Number 4    Number of Visits 8    Date for OT Re-Evaluation 09/04/22    Authorization Type [employer] Medicare    Progress Note Due on Visit 10    OT Start Time 1157    OT Stop Time 1232    OT Time Calculation (min) 35 min    Activity Tolerance Patient tolerated treatment well;No increased pain;Patient limited by fatigue;Patient limited by pain    Behavior During Therapy [facility] for tasks assessed/performed             Past Medical History:  Diagnosis Date   Abnormal Pap smear of cervix    Anxiety    Aortic atherosclerosis ([place])    Atypical chest pain 10/08/2020   Symptoms are very atypical.  She feels good with exercise and notes that in general she is feeling better since she started the exercise program.  She had a normal stress test in 2021.  No repeat ischemia testing advised.  She does also report difficulty and pain with swallowing with referred pain into her back.  She has GI follow-up.   Diverticulitis    Fatty liver disease, nonalcoholic    Glaucoma    H. pylori infection    H/O dizziness    Hemorrhoids    History of depression    incest survivor/dysthymia   Hypertension    Hyperthyroidism    Hypokalemia 04/29/2020   IBS (irritable bowel syndrome)    lactose intolerance   OSA (obstructive sleep apnea) 11/17/2012   Palpitations 04/29/2020   Restless leg syndrome    Sleep apnea    Snoring 11/17/2012   Thyroid disease    hypothyroid   Varicose veins    Past Surgical History:  Procedure Laterality Date   CHOLECYSTECTOMY  07/19/2017   Dr. 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   Cholelithiasis without obstruction 11/08/2020   Attention deficit hyperactivity disorder 11/08/2020   Abnormal gait 11/08/2020   Spider veins 11/23/2019   Edema of both legs 11/23/2019   CIN III (cervical intraepithelial neoplasia grade III) with severe dysplasia 03/05/2017   Anxiety disorder 01/04/2015   Essential (primary) hypertension 01/04/2015   Hypothyroidism 01/04/2015   Pure hypercholesterolemia 01/04/2015   Right upper quadrant pain 01/01/2015   Vertigo, labyrinthine 11/17/2012   OSA (obstructive sleep apnea) 11/17/2012    ONSET DATE: acute on chronic b/l wrist/thumb pain  REFERRING DIAG:  M25.531,M25.532 (ICD-10-CM) - Bilateral wrist pain  M79.641,M79.642 (ICD-10-CM) - Pain in both hands    THERAPY DIAG:  Stiffness of right hand, not elsewhere classified  Muscle weakness (generalized)  Pain in joint  of right hand  Pain in joint of left hand  Stiffness of left hand, not elsewhere classified  Rationale for Evaluation and Treatment: Rehabilitation  PERTINENT HISTORY: Per MD: "Evaluate and treat. 2-3 times per week for 4-6 weeks. Modalities may include iontophoresis, phonophoresis, stim." "Pt is a 39 female c/o bilat wrist pain, R>L x years. Pt c/o the R wrist started [worsening] over the past few weeks. Pt has started to work out and wonders if the pain is overuse. 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 Pt locates pain to R 1st  MCP joint w/ shooting pain up into the thumb. Pain is in the same location on the L, but not as severe. Pt notes deformity in all of her fingers.  "  Per OT Eval: "She is retired Immunologist, former caregiver.  She states her Lt hand/thumb pain started before her Rt, but the Rt hurts her worse. These things have bothered her for many years. She states a recent weight routine change may have flared her up.  He states her right thumb CMC J now wakes her up at night, which is why she is seeking help now."   PRECAUTIONS: None;  WEIGHT BEARING RESTRICTIONS: No   SUBJECTIVE:   SUBJECTIVE STATEMENT:  She states being more body aware now and noticing that she hurt her right thumb when using scissors the other day.  She states understanding that she needs to adapt now.   PAIN:  Are you having pain?  Yes a bit sore today Rating:2-3/10 at rest now  PATIENT GOALS: To decrease pain in the right hand primarily to sleep better at night and have better function.   OBJECTIVE: (All objective assessments below are from initial evaluation on: 07/21/22 unless otherwise specified.)   HAND DOMINANCE: Right   ADLs: Overall ADLs: States decreased ability to grab, hold household objects, pain and inability to open containers, perform FMS tasks (manipulate fasteners on clothing)   FUNCTIONAL OUTCOME MEASURES: Eval: Quck DASH 25% impairment today  (Higher % Score  =  More Impairment)    UPPER EXTREMITY ROM     Shoulder to Wrist AROM Right eval Left eval  Wrist flexion 63 68  Wrist extension 65 57  (Blank rows = not tested)   Hand AROM Right eval Left eval  Full Fist Ability (or Gap to Distal Palmar Crease) Full (IF a bit stiff) Full  Thumb Opposition  (Kapandji Scale)  9 9  Thumb MCP (0-60) 44 54  Thumb IP (0-80) 57 66  Thumb Radial Abduction Span 6.5cm  6cm  Thumb Palmar Abduction Span 6cm span  6cm  (Blank rows = not tested)   UPPER EXTREMITY MMT:    Eval: She had at least  4 / 5 MMT strength in bilateral wrists/hands in all planes.  Isometric strength was not very painful to her, only moving strength-which is typical with arthritis  HAND FUNCTION: Eval: Observed weakness in more affected Rt hand.  Grip strength Right: 45 lbs, Left: 49 lbs   OBSERVATIONS:   Eval: Some overt evidence of Heberden's nodes and Bouchard's nodes, some bulkiness around the thumb joints from arthritis.  Pain increased with motion but fairly strong and stable isometrically.  Interestingly her pain reports her worst in the night, but that could be due to lack of stimuli to distract her from her pain or a symptom of overuse from the  days activities.   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   TODAY'S TREATMENT:  08/11/22: OT reviews and discusses recommendations from last session including adapting her techniques in the gym setting, wearing padded gloves, attempting home techniques as needed as well.  As her thumb was recently hurt by a scissor activity, OT also has her use shears and regular scissors to perform a cutting activity which she does much better with shears today.  OT also shows her adapted drawer openers that she could purchase to help with stress at her thumb.  She asks about thumb exercises and her HEP is reviewed with her-especially opposition strength at the thumb.  Additionally she complains about night pain and hip and OT educates on self-care for sleep positions and also some hip stretches that can help with that as well.  Lastly she also complains about continued paresthesia in her right small finger ulnarly only.  OT feels that this is perhaps a compression somewhere of the nerve or even the proper digital nerves and assigns ulnar nerve glides to help with this sensation and recommends to identify and avoid postures or activities that increase finger numbness in this area.  She performs back nerve glides stating feeling like tingling and states understanding the need  to continue to be body aware and adapt and overcome.  Exercises - Seated Wrist Flexion Stretch  - 3 x daily - 3 reps - 15 hold - Wrist Prayer Stretch  - 3 x daily - 3 reps - 15 sec hold - Stretch Thumb DOWNWARD  - 3 x daily - 3 reps - 15 sec hold - Thumb Webspace Stretch  - 3 x daily - 3 reps - 15 sec hold - Towel Roll Grip with Forearm in Neutral  - 3 x daily - 5 reps - 10 sec hold - Spread Index Finger Apart  - 3 x daily - 5 reps - 5 sec hold - C-Strength (try using rubber band)   - 3 x daily - 5 reps - 5 sec hold - HOOK Stretch  - 3 x daily - 3 reps - 15-20 sec hold - Ulnar Nerve Flossing  - 3-4 x daily - 1-2 sets - 5-10 reps   PATIENT EDUCATION: Education details: See tx section above for details  Person educated: Patient Education method: Verbal Instruction, Teach back, Handouts  Education comprehension: States and demonstrates understanding, Additional Education required    HOME EXERCISE PROGRAM: Access Code: [email or web address] URL: [email or web address]   GOALS: Goals reviewed with patient? Yes   SHORT TERM GOALS: (STG required if POC>30 days) Target Date: 07/31/22  Pt will obtain protective, custom orthotic. Goal status: 08/11/22: N/A   2.  Pt will demo/state understanding of initial HEP to improve pain levels and prerequisite motion. Goal status: MET 08/04/22   LONG TERM GOALS: Target Date: 09/04/22  Pt will improve functional ability by decreased impairment per Quick DASH assessment from 25% to 10% or better, for better quality of life. Goal status: 08/11/22: Progressing   2.  Pt will improve grip strength in Rt hand from 45lbs to at least 50lbs for functional use at home and in IADLs. Goal status: 08/11/22: Progressing   3.  Pt will improve A/ROM in Rt thumb MCP J from 44* to at least 50*, to have functional motion for tasks like  grasp.  Goal status: 08/11/22: Progressing   4.  Pt will decrease pain at worst from 10/10 in the night to 3/10 or better to  have  better sleep and occupational participation in daily roles. Goal status: 08/11/22: Progressing   ASSESSMENT:  CLINICAL IMPRESSION: 08/11/22: She continues to do much better and be more body aware have less pain and problems.  We have also been recently addressing sleep problems as well as issues with sensation in the ulnar nerve in the right hand.  Continue plan of care, OT feels that she should only need 1-2 more weeks of therapy before maximizing her progress but this is dependent on her to a large degree.   PLAN:  OT FREQUENCY: 1-2x/week  OT DURATION: 6 weeks (through 09/04/22 as needed)   PLANNED INTERVENTIONS: self care/ADL training, therapeutic exercise, therapeutic activity, manual therapy, passive range of motion, splinting, fluidotherapy, compression bandaging, moist heat, cryotherapy, contrast bath, patient/family education, energy conservation, coping strategies training, and DME and/or AE instructions  CONSULTED AND AGREED WITH PLAN OF CARE: Patient  PLAN FOR NEXT SESSION:  Review recommendations for sleep and nerves as needed, continue with stable C protocol and management of hand arthritis with modalities manual therapy and exercises and activities as needed.  Benito Mccreedy, OTR/L, CHT 08/11/2022, 12:51 PM

## 2022-08-11 ENCOUNTER — Encounter: Payer: Self-pay | Admitting: Rehabilitative and Restorative Service Providers"

## 2022-08-11 ENCOUNTER — Ambulatory Visit: Payer: Medicare Other | Admitting: Rehabilitative and Restorative Service Providers"

## 2022-08-11 DIAGNOSIS — M25542 Pain in joints of left hand: Secondary | ICD-10-CM | POA: Diagnosis not present

## 2022-08-11 DIAGNOSIS — M25641 Stiffness of right hand, not elsewhere classified: Secondary | ICD-10-CM | POA: Diagnosis not present

## 2022-08-11 DIAGNOSIS — M25642 Stiffness of left hand, not elsewhere classified: Secondary | ICD-10-CM | POA: Diagnosis not present

## 2022-08-11 DIAGNOSIS — M25541 Pain in joints of right hand: Secondary | ICD-10-CM | POA: Diagnosis not present

## 2022-08-11 DIAGNOSIS — M6281 Muscle weakness (generalized): Secondary | ICD-10-CM | POA: Diagnosis not present

## 2022-08-12 ENCOUNTER — Other Ambulatory Visit (HOSPITAL_BASED_OUTPATIENT_CLINIC_OR_DEPARTMENT_OTHER): Payer: Self-pay | Admitting: Obstetrics & Gynecology

## 2022-08-12 MED ORDER — ESTRADIOL 0.1 MG/GM VA CREA: 1.0000 g | TOPICAL_CREAM | VAGINAL | 0 refills | Status: DC

## 2022-08-13 NOTE — Therapy (Incomplete)
OUTPATIENT OCCUPATIONAL THERAPY TREATMENT NOTE  Patient Name: Jodi [name] MRN: [id number] DOB:1951-06-14, 72 y.o., female Today's Date: 08/13/2022  PCP: Carol Ada, MD REFERRING PROVIDER:  Gregor Hams, MD    END OF SESSION:    Past Medical History:  Diagnosis Date   Abnormal Pap smear of cervix    Anxiety    Aortic atherosclerosis ([place])    Atypical chest pain 10/08/2020   Symptoms are very atypical.  She feels good with exercise and notes that in general she is feeling better since she started the exercise program.  She had a normal stress test in 2021.  No repeat ischemia testing advised.  She does also report difficulty and pain with swallowing with referred pain into her back.  She has GI follow-up.   Diverticulitis    Fatty liver disease, nonalcoholic    Glaucoma    H. pylori infection    H/O dizziness    Hemorrhoids    History of depression    incest survivor/dysthymia   Hypertension    Hyperthyroidism    Hypokalemia 04/29/2020   IBS (irritable bowel syndrome)    lactose intolerance   OSA (obstructive sleep apnea) 11/17/2012   Palpitations 04/29/2020   Restless leg syndrome    Sleep apnea    Snoring 11/17/2012   Thyroid disease    hypothyroid   Varicose veins    Past Surgical History:  Procedure Laterality Date   CHOLECYSTECTOMY  07/19/2017   Dr. 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    Cholelithiasis without obstruction 11/08/2020   Attention deficit hyperactivity disorder 11/08/2020   Abnormal gait 11/08/2020   Spider veins 11/23/2019   Edema of both legs 11/23/2019   CIN III (cervical intraepithelial neoplasia grade III) with severe dysplasia 03/05/2017   Anxiety disorder 01/04/2015   Essential (primary) hypertension 01/04/2015   Hypothyroidism 01/04/2015   Pure hypercholesterolemia 01/04/2015   Right upper quadrant pain 01/01/2015   Vertigo, labyrinthine 11/17/2012   OSA (obstructive sleep apnea) 11/17/2012    ONSET DATE: acute on chronic b/l wrist/thumb pain  REFERRING DIAG:  M25.531,M25.532 (ICD-10-CM) - Bilateral wrist pain  M79.641,M79.642 (ICD-10-CM) - Pain in both hands    THERAPY DIAG:  No diagnosis found.  Rationale for Evaluation and Treatment: Rehabilitation  PERTINENT HISTORY: Per MD: "Evaluate and treat. 2-3 times per week for 4-6 weeks. Modalities may include iontophoresis, phonophoresis, stim." "Pt is a 37 female c/o bilat wrist pain, R>L x years. Pt c/o the R wrist started [worsening] over the past few weeks. Pt has started to work out and wonders if the pain is overuse. 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 Pt locates pain to R 1st MCP joint w/ shooting pain up into the thumb. Pain is in the same location on the L, but not as severe.  Pt notes deformity in all of her fingers.  "  Per OT Eval: "She is retired Immunologist, former caregiver.  She states her Lt hand/thumb pain started before her Rt, but the Rt hurts her worse. These things have bothered her for many years. She states a recent weight routine change may have flared her up.  He states her right thumb CMC J now wakes her up at night, which is why she is seeking help now."   PRECAUTIONS: None;  WEIGHT BEARING RESTRICTIONS: No   SUBJECTIVE:   SUBJECTIVE STATEMENT:  She states ***    being more body aware now and noticing that she hurt her right thumb when using scissors the other day.  She states understanding that she needs to adapt now.   PAIN:  Are you having pain? *** Yes a bit sore today Rating:2-3/10 at rest now  PATIENT GOALS: To decrease pain in the right hand primarily to sleep better at night and have better function.   OBJECTIVE: (All objective assessments below are from initial evaluation on: 07/21/22 unless otherwise specified.)   HAND DOMINANCE: Right   ADLs: Overall ADLs: States decreased ability to grab, hold household objects, pain and inability to open containers, perform FMS tasks (manipulate fasteners on clothing)   FUNCTIONAL OUTCOME MEASURES: Eval: Quck DASH 25% impairment today  (Higher % Score  =  More Impairment)    UPPER EXTREMITY ROM     Shoulder to Wrist AROM Right eval Left eval  Wrist flexion 63 68  Wrist extension 65 57  (Blank rows = not tested)   Hand AROM Right eval Left eval  Full Fist Ability (or Gap to Distal Palmar Crease) Full (IF a bit stiff) Full  Thumb Opposition  (Kapandji Scale)  9 9  Thumb MCP (0-60) 44 54  Thumb IP (0-80) 57 66  Thumb Radial Abduction Span 6.5cm  6cm  Thumb Palmar Abduction Span 6cm span  6cm  (Blank rows = not tested)   UPPER EXTREMITY MMT:    Eval: She had at least 4 / 5 MMT strength in bilateral wrists/hands in all planes.  Isometric strength was not very painful to her, only moving strength-which is typical with arthritis  HAND FUNCTION: Eval: Observed weakness in more affected Rt hand.  Grip strength Right: 45 lbs, Left: 49 lbs   OBSERVATIONS:   Eval: Some overt evidence of Heberden's nodes and Bouchard's nodes, some bulkiness around the thumb joints from arthritis.  Pain increased with motion but fairly strong and stable isometrically.  Interestingly her pain reports her worst in the night, but that could be due to lack of stimuli to distract her from her pain or a symptom of  overuse from the days activities.   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   TODAY'S TREATMENT:  08/18/22: *** Review recommendations for sleep and nerves as needed, continue with stable C protocol and management of hand arthritis with modalities manual therapy and exercises and activities as needed.   08/11/22: OT reviews and discusses recommendations from last session including adapting her techniques in the gym setting, wearing padded gloves, attempting home techniques as needed as well.  As her thumb was recently hurt by a scissor activity, OT also has her use shears and regular scissors to perform a cutting activity which she does much better with shears today.  OT also shows her adapted drawer openers that she could purchase to help with stress at her thumb.  She  asks about thumb exercises and her HEP is reviewed with her-especially opposition strength at the thumb.  Additionally she complains about night pain and hip and OT educates on self-care for sleep positions and also some hip stretches that can help with that as well.  Lastly she also complains about continued paresthesia in her right small finger ulnarly only.  OT feels that this is perhaps a compression somewhere of the nerve or even the proper digital nerves and assigns ulnar nerve glides to help with this sensation and recommends to identify and avoid postures or activities that increase finger numbness in this area.  She performs back nerve glides stating feeling like tingling and states understanding the need to continue to be body aware and adapt and overcome.  Exercises - Seated Wrist Flexion Stretch  - 3 x daily - 3 reps - 15 hold - Wrist Prayer Stretch  - 3 x daily - 3 reps - 15 sec hold - Stretch Thumb DOWNWARD  - 3 x daily - 3 reps - 15 sec hold - Thumb Webspace Stretch  - 3 x daily - 3 reps - 15 sec hold - Towel Roll Grip with Forearm in Neutral  - 3 x daily - 5 reps - 10 sec hold - Spread Index Finger Apart  -  3 x daily - 5 reps - 5 sec hold - C-Strength (try using rubber band)   - 3 x daily - 5 reps - 5 sec hold - HOOK Stretch  - 3 x daily - 3 reps - 15-20 sec hold - Ulnar Nerve Flossing  - 3-4 x daily - 1-2 sets - 5-10 reps   PATIENT EDUCATION: Education details: See tx section above for details  Person educated: Patient Education method: Verbal Instruction, Teach back, Handouts  Education comprehension: States and demonstrates understanding, Additional Education required    HOME EXERCISE PROGRAM: Access Code: [email or web address] URL: [email or web address]   GOALS: Goals reviewed with patient? Yes   SHORT TERM GOALS: (STG required if POC>30 days) Target Date: 07/31/22  Pt will obtain protective, custom orthotic. Goal status: 08/11/22: N/A   2.  Pt will demo/state understanding of initial HEP to improve pain levels and prerequisite motion. Goal status: MET 08/04/22   LONG TERM GOALS: Target Date: 09/04/22  Pt will improve functional ability by decreased impairment per Quick DASH assessment from 25% to 10% or better, for better quality of life. Goal status: 08/11/22: Progressing   2.  Pt will improve grip strength in Rt hand from 45lbs to at least 50lbs for functional use at home and in IADLs. Goal status: 08/11/22: Progressing   3.  Pt will improve A/ROM in Rt thumb MCP J from 44* to at least 50*, to have functional motion for tasks like  grasp.  Goal status: 08/11/22: Progressing   4.  Pt will decrease pain at worst from 10/10 in the night to 3/10 or better to have better sleep and occupational participation in daily roles. Goal status: 08/11/22: Progressing   ASSESSMENT:  CLINICAL IMPRESSION: 08/18/22: ***  08/11/22: She continues to do much better and be more body aware have less pain and problems.  We have also been recently addressing sleep problems as well as issues with sensation in the ulnar nerve in the right hand.  Continue plan of care, OT feels that she should only need  1-2 more weeks of therapy before maximizing her progress but this is dependent on her to a large degree.   PLAN:  OT FREQUENCY:  1-2x/week  OT DURATION: 6 weeks (through 09/04/22 as needed)   PLANNED INTERVENTIONS: self care/ADL training, therapeutic exercise, therapeutic activity, manual therapy, passive range of motion, splinting, fluidotherapy, compression bandaging, moist heat, cryotherapy, contrast bath, patient/family education, energy conservation, coping strategies training, and DME and/or AE instructions  CONSULTED AND AGREED WITH PLAN OF CARE: Patient  PLAN FOR NEXT SESSION:  ***  Benito Mccreedy, OTR/L, CHT 08/13/2022, 2:25 PM

## 2022-08-18 ENCOUNTER — Encounter: Payer: Medicare Other | Admitting: Rehabilitative and Restorative Service Providers"

## 2022-08-20 NOTE — Therapy (Signed)
OUTPATIENT OCCUPATIONAL THERAPY TREATMENT & DISCHARGE NOTE  Patient Name: Jodi [name] MRN: [id number] DOB:December 08, 1950, 72 y.o., female Today's Date: 08/25/2022  PCP: Carol Ada, MD REFERRING PROVIDER:  Gregor Hams, MD    END OF SESSION:  OT End of Session - 08/25/22 1400     Visit Number 5    Number of Visits 8    Date for OT Re-Evaluation 09/04/22    Authorization Type [employer]    Progress Note Due on Visit 10    OT Start Time 1355    OT Stop Time 1435    OT Time Calculation (min) 40 min    Activity Tolerance Patient tolerated treatment well;No increased pain    Behavior During Therapy [facility] for tasks assessed/performed              Past Medical History:  Diagnosis Date   Abnormal Pap smear of cervix    Anxiety    Aortic atherosclerosis ([place])    Atypical chest pain 10/08/2020   Symptoms are very atypical.  She feels good with exercise and notes that in general she is feeling better since she started the exercise program.  She had a normal stress test in 2021.  No repeat ischemia testing advised.  She does also report difficulty and pain with swallowing with referred pain into her back.  She has GI follow-up.   Diverticulitis    Fatty liver disease, nonalcoholic    Glaucoma    H. pylori infection    H/O dizziness    Hemorrhoids    History of depression    incest survivor/dysthymia   Hypertension    Hyperthyroidism    Hypokalemia 04/29/2020   IBS (irritable bowel syndrome)    lactose intolerance   OSA (obstructive sleep apnea) 11/17/2012   Palpitations 04/29/2020   Restless leg syndrome    Sleep apnea    Snoring 11/17/2012   Thyroid disease    hypothyroid   Varicose veins    Past Surgical History:  Procedure Laterality Date   CHOLECYSTECTOMY  07/19/2017   Dr. 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   Cholelithiasis without obstruction 11/08/2020   Attention deficit hyperactivity disorder 11/08/2020   Abnormal gait 11/08/2020   Spider veins 11/23/2019   Edema of both legs 11/23/2019   CIN III (cervical intraepithelial neoplasia grade III) with severe dysplasia 03/05/2017   Anxiety disorder 01/04/2015   Essential (primary) hypertension 01/04/2015   Hypothyroidism 01/04/2015   Pure hypercholesterolemia 01/04/2015   Right upper quadrant pain 01/01/2015   Vertigo, labyrinthine 11/17/2012   OSA (obstructive sleep apnea) 11/17/2012    ONSET DATE: acute on chronic b/l wrist/thumb pain  REFERRING DIAG:  M25.531,M25.532 (ICD-10-CM) - Bilateral wrist pain  M79.641,M79.642 (ICD-10-CM) - Pain in both hands    THERAPY DIAG:  Stiffness of right hand, not elsewhere classified  Muscle weakness (generalized)  Pain in joint of left hand  Stiffness of left hand, not elsewhere classified  Pain in joint of right hand  Rationale for Evaluation and Treatment: Rehabilitation  PERTINENT HISTORY: Per MD: "Evaluate and treat. 2-3 times per week for 4-6 weeks. Modalities may include iontophoresis, phonophoresis, stim." "Pt is a 38 female c/o bilat wrist pain, R>L x years. Pt c/o the R wrist started [worsening] over the past few weeks. Pt has started to work out and wonders if the pain is overuse. 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 Pt locates pain to R 1st MCP joint w/ shooting pain up into the  thumb. Pain is in the same location on the L, but not as severe. Pt notes deformity in all of her fingers.  "  Per OT Eval: "She is retired Immunologist, former caregiver.  She states her Lt hand/thumb pain started before her Rt, but the Rt hurts her worse. These things have bothered her for many years. She states a recent weight routine change may have flared her up.  He states her right thumb CMC J now wakes her up at night, which is why she is seeking help now."   PRECAUTIONS: None;  WEIGHT BEARING RESTRICTIONS: No   SUBJECTIVE:   SUBJECTIVE STATEMENT:  She returns after 2 weeks, states that she feels really well and she wants to review her goals for therapy.  She also states being more body aware and avoiding things that hurt her  PAIN:  Are you having pain?  Yes a bit sore today Rating: 0/10 at rest now  PATIENT GOALS: To decrease pain in the right hand primarily to sleep better at night and have better function.   OBJECTIVE: (All objective assessments below are from initial evaluation on: 07/21/22 unless otherwise specified.)   HAND DOMINANCE: Right   ADLs: Overall ADLs: States decreased ability to grab, hold household objects, pain and inability to open containers, perform FMS tasks (manipulate fasteners on clothing)   FUNCTIONAL OUTCOME MEASURES: 08/25/22: Quick DASH: 9% today   Eval: Quck DASH 25% impairment today  (Higher % Score  =  More Impairment)    UPPER EXTREMITY ROM     Shoulder to Wrist AROM Right eval Left eval Rt  /  Lt 08/25/22  Wrist flexion 63 68 75  /  75  Wrist extension 65 57 70  /  66  (Blank rows = not tested)   Hand AROM Right eval Left eval Rt  /  Lt  08/25/22  Full Fist Ability (or Gap to Distal Palmar Crease) Full (IF a bit stiff) Full Full b/l   Thumb Opposition  (Kapandji Scale)  9 9 10 $ b/l   Thumb MCP (0-60) 44 54 55  /  50  Thumb IP (0-80) 57 66 63  /  73  Thumb Radial Abduction Span 6.5cm  6cm   Thumb Palmar Abduction  Span 6cm span  6cm   (Blank rows = not tested)   UPPER EXTREMITY MMT:    08/25/22: 5/5 MMT b/l now in wrists   Eval: She had at least 4 / 5 MMT strength in bilateral wrists/hands in all planes.  Isometric strength was not very painful to her, only moving strength-which is typical with arthritis  HAND FUNCTION: 08/25/22:  Rt grip: 49 Lt: 52  Eval: Observed weakness in more affected Rt hand.  Grip strength Right: 45 lbs, Left: 49 lbs  OBSERVATIONS:   Eval: Some overt evidence of Heberden's nodes and Bouchard's nodes, some bulkiness around the thumb joints from arthritis.  Pain increased with motion but fairly strong and stable isometrically.  Interestingly her pain reports her worst in the night, but that could be due to lack of stimuli to distract her from her pain or a symptom of overuse from the days activities.   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   TODAY'S TREATMENT:  08/25/22: Pt performs AROM, gripping, and strength with b/l hands against therapist's resistance for exercise/activities as well as new measures today. OT also discusses home and functional tasks with the pt and reviews goals.  OT reviews avoiding compression on the ulnar nerve at night and in the day with sleeping and functional tasks.  She states understanding the need to avoid these things.  Using the complied data, OT also reviews home exercises and provides final recommendations on HEP, and self-care recommendations.  Additionally OT reviews modifying tasks like lifting a heavy pot full of water by using an underhand grip and using both hands with the oven glove on the opposite hand.  Other scenarios were also discussed as well as lifting weights with modified techniques for self-care routines.  Pt states understanding and agrees that she is doing much better and feels comfortable discharging to self maintenance at this point.  Exercises - Seated Wrist Flexion Stretch  - 3 x daily - 3 reps - 15 hold -  Wrist Prayer Stretch  - 3 x daily - 3 reps - 15 sec hold - Stretch Thumb DOWNWARD  - 3 x daily - 3 reps - 15 sec hold - Thumb Webspace Stretch  - 3 x daily - 3 reps - 15 sec hold - Towel Roll Grip with Forearm in Neutral  - 3 x daily - 5 reps - 10 sec hold - Spread Index Finger Apart  - 3 x daily - 5 reps - 5 sec hold - C-Strength (try using rubber band)   - 3 x daily - 5 reps - 5 sec hold - HOOK Stretch  - 3 x daily - 3 reps - 15-20 sec hold - Ulnar Nerve Flossing  - 3-4 x daily - 1-2 sets - 5-10 reps   PATIENT EDUCATION: Education details: See tx section above for details  Person educated: Patient Education method: Verbal Instruction, Teach back, Handouts  Education comprehension: States and demonstrates understanding, Additional Education required    HOME EXERCISE PROGRAM: Access Code: [email or web address] URL: [email or web address]   GOALS: Goals reviewed with patient? Yes   SHORT TERM GOALS: (STG required if POC>30 days) Target Date: 07/31/22  Pt will obtain protective, custom orthotic. Goal status: 08/11/22: N/A   2.  Pt will demo/state understanding of initial HEP to improve pain levels and prerequisite motion. Goal status: MET 08/04/22   LONG TERM GOALS: Target Date: 09/04/22  Pt will improve functional ability by decreased impairment per Quick DASH assessment from 25% to 10% or better, for better quality of life. Goal status: 08/25/22: MET  2.  Pt will improve grip strength in Rt hand from 45lbs to at least 50lbs for functional use at home and in IADLs. Goal status: 08/25/22: MET (49# considered met)   3.  Pt will improve A/ROM in Rt thumb MCP J from 44* to at least 50*, to have functional motion for tasks like  grasp.  Goal status: 08/25/22: MET  4.  Pt will decrease pain at worst from 10/10 in  the night to 3/10 or better to have better sleep and occupational participation in daily roles. Goal status: 08/25/22: MET  ASSESSMENT:  CLINICAL IMPRESSION: 08/25/22:  She has managed well on her own for the past 2 weeks and returns with no significant pain or complaints now.  She has much better grip strength and motion bilaterally-she is within functional limits for everything with no significant complaints now.  She will discharge successfully today.   PLAN:  OT FREQUENCY, OT DURATION: discharge now   PLANNED INTERVENTIONS: self care/ADL training, therapeutic exercise, therapeutic activity, manual therapy, passive range of motion, splinting, fluidotherapy, compression bandaging, moist heat, cryotherapy, contrast bath, patient/family education, energy conservation, coping strategies training, and DME and/or AE instructions  CONSULTED AND AGREED WITH PLAN OF CARE: Patient  PLAN FOR NEXT SESSION:  Successful d/c now   [employer], OTR/L, CHT 08/25/2022, 4:01 PM    OCCUPATIONAL THERAPY DISCHARGE SUMMARY  Visits from Start of Care: 5  Current functional level related to goals / functional outcomes: Pt has met all goals to satisfactory levels and is pleased with outcomes.   Remaining deficits: Pt has no more significant functional deficits or pain.   Education / Equipment: Pt has all needed materials and education. Pt understands how to continue on with self-management. See tx notes for more details.   Patient agrees to discharge due to max benefits received from outpatient occupational therapy / hand therapy at this time.   Benito Mccreedy, OTR/L, CHT 08/25/22

## 2022-08-21 ENCOUNTER — Ambulatory Visit (HOSPITAL_BASED_OUTPATIENT_CLINIC_OR_DEPARTMENT_OTHER): Payer: Medicare Other | Admitting: Obstetrics & Gynecology

## 2022-08-21 ENCOUNTER — Encounter (HOSPITAL_BASED_OUTPATIENT_CLINIC_OR_DEPARTMENT_OTHER): Payer: Self-pay | Admitting: Obstetrics & Gynecology

## 2022-08-21 ENCOUNTER — Other Ambulatory Visit (HOSPITAL_COMMUNITY)
Admission: RE | Admit: 2022-08-21 | Discharge: 2022-08-21 | Disposition: A | Discharge status: Home / Self Care | Payer: Medicare Other | Source: Ambulatory Visit | Attending: Obstetrics & Gynecology | Admitting: Obstetrics & Gynecology

## 2022-08-21 VITALS — BP 128/72 | HR 88 | Ht 65.5 in | Wt 207.4 lb

## 2022-08-21 DIAGNOSIS — R87610 Atypical squamous cells of undetermined significance on cytologic smear of cervix (ASC-US): Secondary | ICD-10-CM | POA: Insufficient documentation

## 2022-08-21 DIAGNOSIS — Z9889 Other specified postprocedural states: Secondary | ICD-10-CM | POA: Diagnosis not present

## 2022-08-21 DIAGNOSIS — Z01411 Encounter for gynecological examination (general) (routine) with abnormal findings: Secondary | ICD-10-CM | POA: Insufficient documentation

## 2022-08-21 DIAGNOSIS — R87618 Other abnormal cytological findings on specimens from cervix uteri: Secondary | ICD-10-CM | POA: Insufficient documentation

## 2022-08-21 DIAGNOSIS — Z87410 Personal history of cervical dysplasia: Secondary | ICD-10-CM | POA: Diagnosis not present

## 2022-08-21 NOTE — Progress Notes (Signed)
GYNECOLOGY  VISIT  CC:   repeat pap smear  HPI: 72 y.o. G1P1 Married White or Caucasian female here for repeat pap smear due to hx of atrophic pattern with epithelial atypia noted on pap smear 05/21/2022.  She had neg HR HPV at that visit.  H/O LEEP x 2 in 2009 and 2015.  Follow up pap smears have been good until this last one.  Due to the atrophic notation on pap smear, she has been using vaginal estrogen twice weekly since and is here for a repeat pap smear.  Denies vaginal bleeding.    Past Medical History:  Diagnosis Date   Abnormal Pap smear of cervix    Anxiety    Aortic atherosclerosis ([place])    Atypical chest pain 10/08/2020   Symptoms are very atypical.  She feels good with exercise and notes that in general she is feeling better since she started the exercise program.  She had a normal stress test in 2021.  No repeat ischemia testing advised.  She does also report difficulty and pain with swallowing with referred pain into her back.  She has GI follow-up.   Diverticulitis    Fatty liver disease, nonalcoholic    Glaucoma    H. pylori infection    H/O dizziness    Hemorrhoids    History of depression    incest survivor/dysthymia   Hypertension    Hyperthyroidism    Hypokalemia 04/29/2020   IBS (irritable bowel syndrome)    lactose intolerance   OSA (obstructive sleep apnea) 11/17/2012   Palpitations 04/29/2020   Restless leg syndrome    Sleep apnea    Snoring 11/17/2012   Thyroid disease    hypothyroid   Varicose veins     MEDS:   Current Outpatient Medications on File Prior to Visit  Medication Sig Dispense Refill   ALPRAZolam (XANAX) 0.25 MG tablet Take 0.125 mg by mouth every 6 (six) hours as needed for anxiety.     estradiol (ESTRACE) 0.1 MG/GM vaginal cream Place 1 g vaginally 2 (two) times a week. 42.5 g 0   fluticasone (FLONASE) 50 MCG/ACT nasal spray Place 2 sprays into both nostrils daily as needed for allergies.      lactase (LACTAID) 3000 units tablet Take  3,000 Units by mouth as needed (lactaid).      levothyroxine (SYNTHROID) 112 MCG tablet Take 112 mcg by mouth daily.     Magnesium 200 MG TABS Take 200 mg by mouth daily.     Multiple Vitamin (MULTIVITAMIN WITH MINERALS) TABS Take 1 tablet by mouth daily.     saccharomyces boulardii (FLORASTOR) 250 MG capsule Take one tablet BID x 1 month 60 capsule 0   timolol (TIMOPTIC) 0.5 % ophthalmic solution Place 1 drop into both eyes every morning.  3   No current facility-administered medications on file prior to visit.    ALLERGIES: Sulfites, Amlodipine, Iodine, Macrodantin [nitrofurantoin macrocrystal], Serotonin reuptake inhibitors (ssris), Shellfish allergy, Venlafaxine, Almond oil, and Bentyl [dicyclomine hcl]  SH:  married, non smoker  Review of Systems  Constitutional: Negative.   Genitourinary: Negative.     PHYSICAL EXAMINATION:    BP 128/72   Pulse 88   Ht 5' 5.5" (1.664 m) Comment: Reported  Wt 207 lb 6.4 oz (94.1 kg)   LMP 07/06/2010   BMI 33.99 kg/m     General appearance: alert, cooperative and appears stated age Lymph:  no inguinal LAD noted  Pelvic: External genitalia:  no lesions  Urethra:  normal appearing urethra with no masses, tenderness or lesions              Bartholins and Skenes: normal                 Vagina: normal appearing vagina with normal color and discharge, no lesions              Cervix: no lesions              Bimanual Exam:  Uterus:  normal size, contour, position, consistency, mobility, non-tender             Chaperone, Octaviano Batty, CMA, was present for exam.  Assessment/Plan: 1. Other abnormal cytological finding of specimen from cervix - Cytology - PAP( [place])  2. History of cervical dysplasia  3. History of loop electrical excision procedure (LEEP)

## 2022-08-24 ENCOUNTER — Encounter: Payer: Self-pay | Admitting: *Deleted

## 2022-08-25 ENCOUNTER — Ambulatory Visit: Payer: Medicare Other | Admitting: Rehabilitative and Restorative Service Providers"

## 2022-08-25 DIAGNOSIS — M25541 Pain in joints of right hand: Secondary | ICD-10-CM | POA: Diagnosis not present

## 2022-08-25 DIAGNOSIS — M25641 Stiffness of right hand, not elsewhere classified: Secondary | ICD-10-CM

## 2022-08-25 DIAGNOSIS — M25642 Stiffness of left hand, not elsewhere classified: Secondary | ICD-10-CM

## 2022-08-25 DIAGNOSIS — M6281 Muscle weakness (generalized): Secondary | ICD-10-CM

## 2022-08-25 DIAGNOSIS — M25542 Pain in joints of left hand: Secondary | ICD-10-CM

## 2022-08-26 DIAGNOSIS — R7303 Prediabetes: Secondary | ICD-10-CM | POA: Diagnosis not present

## 2022-08-26 DIAGNOSIS — I10 Essential (primary) hypertension: Secondary | ICD-10-CM | POA: Diagnosis not present

## 2022-08-29 LAB — CYTOLOGY - PAP: Diagnosis: UNDETERMINED — AB

## 2022-08-31 DIAGNOSIS — G4733 Obstructive sleep apnea (adult) (pediatric): Secondary | ICD-10-CM | POA: Diagnosis not present

## 2022-09-09 DIAGNOSIS — I10 Essential (primary) hypertension: Secondary | ICD-10-CM | POA: Diagnosis not present

## 2022-09-09 DIAGNOSIS — G4733 Obstructive sleep apnea (adult) (pediatric): Secondary | ICD-10-CM | POA: Diagnosis not present

## 2022-09-09 DIAGNOSIS — R7303 Prediabetes: Secondary | ICD-10-CM | POA: Diagnosis not present

## 2022-09-15 DIAGNOSIS — M79652 Pain in left thigh: Secondary | ICD-10-CM | POA: Diagnosis not present

## 2022-09-15 DIAGNOSIS — R2242 Localized swelling, mass and lump, left lower limb: Secondary | ICD-10-CM | POA: Diagnosis not present

## 2022-09-15 DIAGNOSIS — M7989 Other specified soft tissue disorders: Secondary | ICD-10-CM | POA: Diagnosis not present

## 2022-09-16 ENCOUNTER — Other Ambulatory Visit: Payer: Self-pay | Admitting: Family Medicine

## 2022-09-16 DIAGNOSIS — M7989 Other specified soft tissue disorders: Secondary | ICD-10-CM

## 2022-09-16 DIAGNOSIS — R2242 Localized swelling, mass and lump, left lower limb: Secondary | ICD-10-CM

## 2022-09-16 DIAGNOSIS — M79652 Pain in left thigh: Secondary | ICD-10-CM

## 2022-09-18 ENCOUNTER — Ambulatory Visit
Admission: RE | Admit: 2022-09-18 | Discharge: 2022-09-18 | Disposition: A | Discharge status: Home / Self Care | Payer: Medicare Other | Source: Ambulatory Visit | Attending: Family Medicine | Admitting: Family Medicine

## 2022-09-18 DIAGNOSIS — M79605 Pain in left leg: Secondary | ICD-10-CM | POA: Diagnosis not present

## 2022-09-18 DIAGNOSIS — M7989 Other specified soft tissue disorders: Secondary | ICD-10-CM

## 2022-09-18 DIAGNOSIS — R2242 Localized swelling, mass and lump, left lower limb: Secondary | ICD-10-CM

## 2022-09-18 DIAGNOSIS — M79652 Pain in left thigh: Secondary | ICD-10-CM

## 2022-10-05 DIAGNOSIS — R7303 Prediabetes: Secondary | ICD-10-CM | POA: Diagnosis not present

## 2022-10-05 DIAGNOSIS — I10 Essential (primary) hypertension: Secondary | ICD-10-CM | POA: Diagnosis not present

## 2022-10-05 DIAGNOSIS — G4733 Obstructive sleep apnea (adult) (pediatric): Secondary | ICD-10-CM | POA: Diagnosis not present

## 2022-10-12 DIAGNOSIS — Z1231 Encounter for screening mammogram for malignant neoplasm of breast: Secondary | ICD-10-CM | POA: Diagnosis not present

## 2022-10-14 ENCOUNTER — Ambulatory Visit: Payer: Medicare Other | Admitting: Family Medicine

## 2022-10-14 ENCOUNTER — Other Ambulatory Visit: Payer: Self-pay

## 2022-10-14 VITALS — BP 172/90 | HR 93 | Ht 65.5 in | Wt 206.0 lb

## 2022-10-14 DIAGNOSIS — M7542 Impingement syndrome of left shoulder: Secondary | ICD-10-CM | POA: Diagnosis not present

## 2022-10-14 DIAGNOSIS — M79642 Pain in left hand: Secondary | ICD-10-CM | POA: Diagnosis not present

## 2022-10-14 DIAGNOSIS — M79641 Pain in right hand: Secondary | ICD-10-CM

## 2022-10-14 NOTE — Progress Notes (Signed)
[name], PhD, LAT, ATC acting as a scribe for Clementeen Graham, MD.  Jodi [name] is a 72 y.o. female who presents to [facility] at [facility] today for f/u bilat hand pain.  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 Pt was last seen by Dr. Denyse Amass on 07/14/22 and was advised to use Voltaren gel and was referred to OT, completing 5 visits (d/c on 2/20). Today, pt reports found OT helpful somewhat. Pt c/o cont'd pain in both thumbs, R>L. Pt locates pain to the [facility] and slightly into the thenar eminence.   She notes she had injections in her feet previously for Planter fasciitis that may have made her depression worse.  She is a little reluctant to consider injections for fear that it might affect her mood. Her mood is better controlled now than it was at the time where she had her feet steroid injections.  Additionally she notes some left shoulder pain.  Pain located in the left upper arm pain with abduction and internal rotation.  She has been increasing her activity level with weight lifting recently.  No injury.  Dx imaging: 07/14/22 R & L hand XR  Pertinent review of systems: No fevers or chills  Relevant historical information: Hypertension   Exam:  BP (!) 172/90   Pulse 93   Ht 5' 5.5" (1.664 m)   Wt 206 lb (93.4 kg)   LMP 07/06/2010   SpO2 97%   BMI 33.76 kg/m  General: Well Developed, well nourished, and in no acute distress.   MSK: Right hand some bossing at first [facility].  Nontender palpation in this region.  Normal hand motion.  Left shoulder: Normal appearing Nontender. Normal motion pain with internal rotation. Intact strength.  Positive Hawkins and Neer's test. Negative Yergason's and speeds test.  Lab and Radiology Results   Diagnostic Limited MSK Ultrasound of: Left shoulder Biceps tendon intact normal. Subscapularis tendon intact Supraspinatus tendon is intact with mild subacromial  bursitis. Infraspinatus tendon is intact. AC joint mild degenerative appearing Impression: Subacromial bursitis   EXAM: RIGHT HAND - COMPLETE 3+ VIEW   COMPARISON:  None Available.   FINDINGS: There is diffuse decreased bone mineralization. Degenerative changes including joint space narrowing, subchondral sclerosis, and peripheral osteophytosis are moderate to severe at the index finger DIP joint, moderate at the thumb interphalangeal joint, moderate to severe at the thumb carpometacarpal joint, mild-to-moderate at the third DIP joint, and mild at the other interphalangeal joints. Mild triscaphe joint space narrowing.   No acute fracture or dislocation. No cortical erosion or periostitis.   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     Electronically Signed   By: Neita Garnet M.D.   On: 07/15/2022 08:46 I, Clementeen Graham, personally (independently) visualized and performed the interpretation of the images attached in this note.     Assessment and Plan: 72 y.o. female with right hand pain thought to be due to DJD especially at the first [facility].  Overall pain decreased with occupational therapy trial.  Still having a hotspot at the first [facility].  We talked about the benefits and risks of steroid injection.  She like to wait a little bit and continue home exercise program for now.  I think that makes a lot of sense.  Happy to proceed with injection anytime.  Left shoulder pain after increasing weight lifting thought to  be impingement and subacromial bursitis.  She is already starting what sounds like a personal training exercise program at [employer] which should be appropriate.  If not better we can add in formal PT/OT.  Injection of course is an option in the future. Recheck as needed.  She will let me know how she feels.   PDMP not reviewed this encounter. Orders Placed This Encounter  Procedures   [place] LIMITED  JOINT SPACE STRUCTURES UP BILAT(NO LINKED CHARGES)    Order Specific Question:   Reason for Exam (SYMPTOM  OR DIAGNOSIS REQUIRED)    Answer:   bilateral hand pain    Order Specific Question:   Preferred imaging location?    Answer:   [facility]   No orders of the defined types were placed in this encounter.    Discussed warning signs or symptoms. Please see discharge instructions. Patient expresses understanding.   The above documentation has been reviewed and is accurate and complete Clementeen Graham, M.D.

## 2022-10-14 NOTE — Patient Instructions (Addendum)
Thank you for coming in today.   I can do an injection any time.   Continue home exercises.   Recheck as needed.   Let me know if you need anything.   Let me know about that left shoulder.   If not better with personal training we can add PT/OT.

## 2022-10-15 DIAGNOSIS — H53431 Sector or arcuate defects, right eye: Secondary | ICD-10-CM | POA: Diagnosis not present

## 2022-10-15 DIAGNOSIS — H401121 Primary open-angle glaucoma, left eye, mild stage: Secondary | ICD-10-CM | POA: Diagnosis not present

## 2022-10-15 DIAGNOSIS — H401112 Primary open-angle glaucoma, right eye, moderate stage: Secondary | ICD-10-CM | POA: Diagnosis not present

## 2022-10-15 DIAGNOSIS — H2513 Age-related nuclear cataract, bilateral: Secondary | ICD-10-CM | POA: Diagnosis not present

## 2022-10-20 DIAGNOSIS — M25569 Pain in unspecified knee: Secondary | ICD-10-CM | POA: Diagnosis not present

## 2022-10-20 DIAGNOSIS — M25519 Pain in unspecified shoulder: Secondary | ICD-10-CM | POA: Diagnosis not present

## 2022-10-23 DIAGNOSIS — M25569 Pain in unspecified knee: Secondary | ICD-10-CM | POA: Diagnosis not present

## 2022-10-23 DIAGNOSIS — M25519 Pain in unspecified shoulder: Secondary | ICD-10-CM | POA: Diagnosis not present

## 2022-10-26 DIAGNOSIS — R7303 Prediabetes: Secondary | ICD-10-CM | POA: Diagnosis not present

## 2022-10-26 DIAGNOSIS — M25569 Pain in unspecified knee: Secondary | ICD-10-CM | POA: Diagnosis not present

## 2022-10-26 DIAGNOSIS — M25519 Pain in unspecified shoulder: Secondary | ICD-10-CM | POA: Diagnosis not present

## 2022-10-26 DIAGNOSIS — G4733 Obstructive sleep apnea (adult) (pediatric): Secondary | ICD-10-CM | POA: Diagnosis not present

## 2022-10-26 DIAGNOSIS — I10 Essential (primary) hypertension: Secondary | ICD-10-CM | POA: Diagnosis not present

## 2022-10-29 ENCOUNTER — Encounter (HOSPITAL_BASED_OUTPATIENT_CLINIC_OR_DEPARTMENT_OTHER): Payer: Self-pay | Admitting: *Deleted

## 2022-10-29 DIAGNOSIS — E7800 Pure hypercholesterolemia, unspecified: Secondary | ICD-10-CM | POA: Diagnosis not present

## 2022-10-29 DIAGNOSIS — I10 Essential (primary) hypertension: Secondary | ICD-10-CM | POA: Diagnosis not present

## 2022-10-29 DIAGNOSIS — M25569 Pain in unspecified knee: Secondary | ICD-10-CM | POA: Diagnosis not present

## 2022-10-29 DIAGNOSIS — E039 Hypothyroidism, unspecified: Secondary | ICD-10-CM | POA: Diagnosis not present

## 2022-10-29 DIAGNOSIS — M25519 Pain in unspecified shoulder: Secondary | ICD-10-CM | POA: Diagnosis not present

## 2022-10-30 ENCOUNTER — Ambulatory Visit (HOSPITAL_BASED_OUTPATIENT_CLINIC_OR_DEPARTMENT_OTHER): Payer: Medicare Other | Admitting: Family

## 2022-10-30 ENCOUNTER — Encounter (HOSPITAL_BASED_OUTPATIENT_CLINIC_OR_DEPARTMENT_OTHER): Payer: Self-pay

## 2022-10-30 ENCOUNTER — Encounter (HOSPITAL_BASED_OUTPATIENT_CLINIC_OR_DEPARTMENT_OTHER): Payer: Self-pay | Admitting: Family

## 2022-10-30 ENCOUNTER — Telehealth (HOSPITAL_BASED_OUTPATIENT_CLINIC_OR_DEPARTMENT_OTHER): Payer: Self-pay

## 2022-10-30 VITALS — BP 120/62 | HR 89 | Ht 65.5 in | Wt 205.8 lb

## 2022-10-30 DIAGNOSIS — I10 Essential (primary) hypertension: Secondary | ICD-10-CM | POA: Diagnosis not present

## 2022-10-30 DIAGNOSIS — E785 Hyperlipidemia, unspecified: Secondary | ICD-10-CM

## 2022-10-30 DIAGNOSIS — E039 Hypothyroidism, unspecified: Secondary | ICD-10-CM

## 2022-10-30 DIAGNOSIS — I700 Atherosclerosis of aorta: Secondary | ICD-10-CM | POA: Diagnosis not present

## 2022-10-30 LAB — COMPREHENSIVE METABOLIC PANEL WITH GFR
ALT: 24 IU/L (ref 0–32)
AST: 21 IU/L (ref 0–40)
Albumin/Globulin Ratio: 1.8 (ref 1.2–2.2)
Albumin: 4.2 g/dL (ref 3.8–4.8)
Alkaline Phosphatase: 60 IU/L (ref 44–121)
BUN/Creatinine Ratio: 13 (ref 12–28)
BUN: 10 mg/dL (ref 8–27)
Bilirubin Total: 0.5 mg/dL (ref 0.0–1.2)
CO2: 22 mmol/L (ref 20–29)
Calcium: 9.2 mg/dL (ref 8.7–10.3)
Chloride: 106 mmol/L (ref 96–106)
Creatinine, Ser: 0.8 mg/dL (ref 0.57–1.00)
Globulin, Total: 2.3 g/dL (ref 1.5–4.5)
Glucose: 125 mg/dL — ABNORMAL HIGH (ref 70–99)
Potassium: 4.6 mmol/L (ref 3.5–5.2)
Sodium: 143 mmol/L (ref 134–144)
Total Protein: 6.5 g/dL (ref 6.0–8.5)
eGFR: 79 mL/min/1.73

## 2022-10-30 LAB — LIPID PANEL
Chol/HDL Ratio: 3.6 ratio (ref 0.0–4.4)
Cholesterol, Total: 207 mg/dL — ABNORMAL HIGH (ref 100–199)
HDL: 58 mg/dL
LDL Chol Calc (NIH): 127 mg/dL — ABNORMAL HIGH (ref 0–99)
Triglycerides: 125 mg/dL (ref 0–149)
VLDL Cholesterol Cal: 22 mg/dL (ref 5–40)

## 2022-10-30 LAB — THYROID PANEL WITH TSH
Free Thyroxine Index: 2.1 (ref 1.2–4.9)
T3 Uptake Ratio: 20 % — ABNORMAL LOW (ref 24–39)
T4, Total: 10.6 ug/dL (ref 4.5–12.0)
TSH: 6.5 u[IU]/mL — ABNORMAL HIGH (ref 0.450–4.500)

## 2022-10-30 MED ORDER — ROSUVASTATIN CALCIUM 5 MG PO TABS: 5.0000 mg | ORAL_TABLET | ORAL | 3 refills | Status: DC

## 2022-10-30 NOTE — Telephone Encounter (Addendum)
Called to review results, patient states she is actually on her way for appointment and will just talk about it when she gets here!     ----- Message from Flossie Dibble, NP sent at 10/30/2022  8:33 AM EDT ----- Ms. Flow, your bad cholesterol is still elevated at 127. We would like for it to be < 70. I would like to start rosuvastatin 10 mg daily. We will then repeat your liver function test and FLP in 8 weeks.   Your thyroid results were abnormal, indicating your thyroid may be not functioning correctly which may be why you have struggled with weight loss. You need to follow up with your PCP about this to see if you need supplementation.  Best, Victorino Dike

## 2022-10-30 NOTE — Progress Notes (Signed)
Office Visit    Patient Name: Jodi [name] Date of Encounter: 10/30/2022  PCP:  [name], MD   [facility]  Cardiologist:  Chilton Si, MD  Advanced Practice Provider:  No care team member to display Electrophysiologist:  None      Chief Complaint    Jodi [name] is a 72 y.o. female presents today for blood pressure follow up   Past Medical History    Past Medical History:  Diagnosis Date   Abnormal Pap smear of cervix    Anxiety    Aortic atherosclerosis ([facility])    Atypical chest pain 10/08/2020   Symptoms are very atypical.  She feels good with exercise and notes that in general she is feeling better since she started the exercise program.  She had a normal stress test in 2021.  No repeat ischemia testing advised.  She does also report difficulty and pain with swallowing with referred pain into her back.  She has GI follow-up.   Diverticulitis    Fatty liver disease, nonalcoholic    Glaucoma    H. pylori infection    H/O dizziness    Hemorrhoids    History of depression    incest survivor/dysthymia   Hypertension    Hyperthyroidism    Hypokalemia 04/29/2020   IBS (irritable bowel syndrome)    lactose intolerance   OSA (obstructive sleep apnea) 11/17/2012   Palpitations 04/29/2020   Restless leg syndrome    Sleep apnea    Snoring 11/17/2012   Thyroid disease    hypothyroid   Varicose veins    Past Surgical History:  Procedure Laterality Date   CHOLECYSTECTOMY  07/19/2017   Dr. Dwain Sarna   COLPOSCOPY  99, 00, 02, 04, 06   ENDOVENOUS ABLATION SAPHENOUS VEIN W/ LASER Right 04-08-2015   endovenous laser ablation (right greater saphenous vein) by Betti Cruz MD     LEEP  3/09   CIN II/III    Allergies  Allergies  Allergen Reactions   Sulfites Anaphylaxis   Amlodipine Other (See Comments)    Abdominal pain   Iodine     Was told to place this as an allergy along with sulfites as she had almost anaphylactic reaction to  shellfish years ago.   Macrodantin [Nitrofurantoin Macrocrystal] Nausea Only and Other (See Comments)    Headache   Serotonin Reuptake Inhibitors (Ssris) Other (See Comments)   Shellfish Allergy    Venlafaxine Other (See Comments)   Almond Oil Itching and Rash    ALL TREE NUTS   Bentyl [Dicyclomine Hcl] Anxiety    Patient noticed anxiety with this medication.    History of Present Illness    Jodi [name] is a 72 y.o. female with a hx of hypertension, OSA (unable to tolerate CPAP) decline anxiety,-ism, palpitations, aortic atherosclerosis last seen 07/31/2022.  Established 2021 for palpitations.  ETT 2021 normal.  7-day event monitor 05/2020 reassuring with sinus rhythm with occasional PAC and sinus tachycardia.  CT 04/2020 did reveal aortic atherosclerosis.  Updated echocardiogram 12/16/2021 LVEF 60 to 65%, no RWMA, grade 1 diastolic dysfunction, trivial MR, trivial AI, AV sclerosis without stenosis.  Saw Jodi Bamberg, NP 07/31/2022.  Blood pressure was elevated in clinic and she reported some element of whitecoat hypertension but preferred to check at home for a few weeks prior to starting medication.  Recommended to consider spironolactone if BP not consistently at goal.  Due to difficulty losing weight thyroid function reassessed.  Labs 10/29/2022  TSH 6.5, T4 10.6, T3 28, free thyroxine index 2.1.  Total cholesterol 207, triglycerides 125, HDL 58, LDL 127.  CMP unremarkable.  She presents today for follow-up.  Brings BP log with home readings 116/73-130/77 with heart rate 70s-80s.  She had a mammogram recently which did note breast arterial calcification. Reviewed prior finding of aortic atherosclerosis, hyperlipidemia, and cholesterol goals. Notes she has been consistently following a more heart healthy diet. Following with [facility]. Working with Systems analyst at [employer] 2-3 times per week as well as PT at [employer] for left shoulder bursitis.   Reviewed thyroid  results. Notes energy level has been fine. Feels over the past few months has been unable to lose weight despite adjusting dietary habits and exercising 3x per week. Wonders if thyroid may be contributory. No changes in bowel habits.   Reports no shortness of breath nor dyspnea on exertion. Reports no chest pain, pressure, or tightness. No edema, orthopnea, PND. Reports very rare and brief palpitations which are not bothersome. Shares with me that her previous anxiety regarding her health or medications is much improved.  For antihypertensives Amlodipine-IBS flare Carvedilol-fatigue HCTZ-hypokalemia  EKGs/Labs/Other Studies Reviewed:   The following studies were reviewed today: Cardiac Studies & Procedures     STRESS TESTS  EXERCISE TOLERANCE TEST (ETT) 05/01/2020  Narrative  Upsloping ST segment depression ST segment depression was noted during stress in the II, III, aVF, V6, V5, V4 and V3 leads.  Blood pressure demonstrated a normal response to exercise.  Negative, adequate stress test.   ECHOCARDIOGRAM  ECHOCARDIOGRAM COMPLETE 12/16/2021  Narrative ECHOCARDIOGRAM REPORT    Patient Name:   Jodi [name] Date of Exam: 12/16/2021 Medical Rec #:  [id number]       Height:       65.5 in Accession #:    [id number]      Weight:       201.9 lb Date of Birth:  [birth year]        BSA:          1.997 m Patient Age:    70 years        BP:           130/90 mmHg Patient Gender: F               HR:           83 bpm. Exam Location:  Outpatient  Procedure: 2D Echo, Color Doppler, Cardiac Doppler and Strain Analysis  Indications:    Essential (primary) hypertension  History:        Patient has no prior history of Echocardiogram examinations. Risk Factors:Hypertension, Dyslipidemia and Former Smoker.  Sonographer:    Jeryl Columbia RDCS Referring Phys: [id number] Jodi [name]  IMPRESSIONS   1. Left ventricular ejection fraction, by estimation, is 60 to 65%. The left ventricle  has normal function. The left ventricle has no regional wall motion abnormalities. Left ventricular diastolic parameters are consistent with Grade I diastolic dysfunction (impaired relaxation). 2. Right ventricular systolic function is normal. The right ventricular size is normal. There is normal pulmonary artery systolic pressure. The estimated right ventricular systolic pressure is 16.4 mmHg. 3. The mitral valve is normal in structure. Trivial mitral valve regurgitation. 4. The aortic valve is tricuspid. Aortic valve regurgitation is trivial. Aortic valve sclerosis is present, with no evidence of aortic valve stenosis. 5. The inferior vena cava is normal in size with greater than 50% respiratory variability, suggesting right atrial pressure of 3  mmHg.  Comparison(s): No prior Echocardiogram.  FINDINGS Left Ventricle: Left ventricular ejection fraction, by estimation, is 60 to 65%. The left ventricle has normal function. The left ventricle has no regional wall motion abnormalities. Global longitudinal strain performed but not reported based on interpreter judgement due to suboptimal tracking. 3D left ventricular ejection fraction analysis performed but not reported based on interpreter judgement due to suboptimal tracking. The left ventricular internal cavity size was normal in size. There is no left ventricular hypertrophy. Left ventricular diastolic parameters are consistent with Grade I diastolic dysfunction (impaired relaxation).  Right Ventricle: The right ventricular size is normal. No increase in right ventricular wall thickness. Right ventricular systolic function is normal. There is normal pulmonary artery systolic pressure. The tricuspid regurgitant velocity is 1.83 m/s, and with an assumed right atrial pressure of 3 mmHg, the estimated right ventricular systolic pressure is 16.4 mmHg.  Left Atrium: Left atrial size was normal in size.  Right Atrium: Right atrial size was normal in  size.  Pericardium: There is no evidence of pericardial effusion.  Mitral Valve: The mitral valve is normal in structure. Trivial mitral valve regurgitation.  Tricuspid Valve: The tricuspid valve is normal in structure. Tricuspid valve regurgitation is trivial.  Aortic Valve: The aortic valve is tricuspid. Aortic valve regurgitation is trivial. Aortic regurgitation PHT measures 608 msec. Aortic valve sclerosis is present, with no evidence of aortic valve stenosis. Aortic valve mean gradient measures 5.0 mmHg. Aortic valve peak gradient measures 9.6 mmHg. Aortic valve area, by VTI measures 2.41 cm.  Pulmonic Valve: The pulmonic valve was normal in structure. Pulmonic valve regurgitation is trivial.  Aorta: The aortic root and ascending aorta are structurally normal, with no evidence of dilitation.  Venous: The inferior vena cava is normal in size with greater than 50% respiratory variability, suggesting right atrial pressure of 3 mmHg.  IAS/Shunts: The atrial septum is grossly normal.   LEFT VENTRICLE PLAX 2D LVIDd:         4.26 cm   Diastology LVIDs:         2.61 cm   LV e' medial:    6.85 cm/s LV PW:         1.08 cm   LV E/e' medial:  12.9 LV IVS:        0.87 cm   LV e' lateral:   7.62 cm/s LVOT diam:     1.90 cm   LV E/e' lateral: 11.6 LV SV:         76 LV SV Index:   38 LVOT Area:     2.84 cm  3D Volume EF: 3D EF:        55 % LV EDV:       99 ml LV ESV:       44 ml LV SV:        55 ml  RIGHT VENTRICLE RV Basal diam:  3.18 cm RV Mid diam:    2.29 cm RV S prime:     16.40 cm/s TAPSE (M-mode): 3.3 cm  LEFT ATRIUM             Index        RIGHT ATRIUM           Index LA diam:        3.70 cm 1.85 cm/m   RA Area:     15.60 cm LA Vol (A2C):   39.8 ml 19.93 ml/m  RA Volume:   43.20 ml  21.63 ml/m LA  Vol (A4C):   38.1 ml 19.07 ml/m LA Biplane Vol: 40.3 ml 20.18 ml/m AORTIC VALVE AV Area (Vmax):    2.20 cm AV Area (Vmean):   2.01 cm AV Area (VTI):     2.41  cm AV Vmax:           155.00 cm/s AV Vmean:          111.000 cm/s AV VTI:            0.314 m AV Peak Grad:      9.6 mmHg AV Mean Grad:      5.0 mmHg LVOT Vmax:         120.00 cm/s LVOT Vmean:        78.600 cm/s LVOT VTI:          0.267 m LVOT/AV VTI ratio: 0.85 AI PHT:            608 msec AR Vena Contracta: 0.20 cm  AORTA Ao Root diam: 2.90 cm Ao Asc diam:  2.80 cm  MITRAL VALVE                TRICUSPID VALVE MV Area (PHT): 3.89 cm     TR Peak grad:   13.4 mmHg MV Decel Time: 195 msec     TR Vmax:        183.00 cm/s MV E velocity: 88.60 cm/s MV A velocity: 103.00 cm/s  SHUNTS MV E/A ratio:  0.86         Systemic VTI:  0.27 m Systemic Diam: 1.90 cm  Laurance Flatten MD Electronically signed by Laurance Flatten MD Signature Date/Time: 12/16/2021/1:46:04 PM    Final    MONITORS  CARDIAC EVENT MONITOR 05/19/2020  Narrative 7 Day Event Monitor  Quality: Fair.  Baseline artifact. Predominant rhythm: sinus rhythm Average heart rate: 85 bpm Max heart rate: 140 bpm Min heart rate: 58 bpm Pauses >2.5 seconds: none Occasional PACs 8 beat run of atrial tachycardia  Patient reported heart fluttering.  At these times, sinus rhythm, PACs and sinus tachycardia were noted.  Jodi C. Duke Salvia, MD, [facility] 05/19/2020 8:44 AM            EKG:  EKG is not ordered today.    Recent Labs: 10/29/2022: ALT 24; BUN 10; Creatinine, Ser 0.80; Potassium 4.6; Sodium 143; TSH 6.500  Recent Lipid Panel    Component Value Date/Time   CHOL 207 (H) 10/29/2022 0845   TRIG 125 10/29/2022 0845   HDL 58 10/29/2022 0845   CHOLHDL 3.6 10/29/2022 0845   LDLCALC 127 (H) 10/29/2022 0845    Home Medications   Current Meds  Medication Sig   ALPRAZolam (XANAX) 0.25 MG tablet Take 0.125 mg by mouth every 6 (six) hours as needed for anxiety.   Cyanocobalamin 1000 MCG TBCR Take 1 tablet by mouth daily.   fluticasone (FLONASE) 50 MCG/ACT nasal spray Place 2 sprays into both nostrils daily as  needed for allergies.    lactase (LACTAID) 3000 units tablet Take 3,000 Units by mouth as needed (lactaid).    levothyroxine (SYNTHROID) 112 MCG tablet Take 112 mcg by mouth daily.   Magnesium 200 MG TABS Take 200 mg by mouth daily.   Multiple Vitamin (MULTIVITAMIN WITH MINERALS) TABS Take 1 tablet by mouth daily.   saccharomyces boulardii (FLORASTOR) 250 MG capsule Take one tablet BID x 1 month   timolol (TIMOPTIC) 0.5 % ophthalmic solution Place 1 drop into both eyes every morning.     Review of Systems  All other systems reviewed and are otherwise negative except as noted above.  Physical Exam    VS:  BP 120/62 (BP Location: Right Arm, Patient Position: Sitting, Cuff Size: Large)   Pulse 89   Ht 5' 5.5" (1.664 m)   Wt 205 lb 12.8 oz (93.4 kg)   LMP 07/06/2010   SpO2 98%   BMI 33.73 kg/m  , BMI Body mass index is 33.73 kg/m.  Wt Readings from Last 3 Encounters:  10/30/22 205 lb 12.8 oz (93.4 kg)  10/14/22 206 lb (93.4 kg)  08/21/22 207 lb 6.4 oz (94.1 kg)    GEN: Well nourished, well developed, in no acute distress. HEENT: normal. Neck: Supple, no JVD, carotid bruits, or masses. Cardiac: RRR, no murmurs, rubs, or gallops. No clubbing, cyanosis, edema.  Radials/PT 2+ and equal bilaterally.  Respiratory:  Respirations regular and unlabored, clear to auscultation bilaterally. GI: Soft, nontender, nondistended. MS: No deformity or atrophy. Skin: Warm and dry, no rash. Neuro:  Strength and sensation are intact. Psych: Normal affect.  Assessment & Plan    HTN- BP at goal <130/80 with lifestyle changes. No indication for medical therapy at this time. Encouraged to continue to monitor at least 3x/week at home and contact us if consistently >130/80. Known element of white coat hypertension. If needed in future, consider Spironolactone as previous hypokalemia on HCTZ.  Palpitations- Quiescent. Not requiring AV nodal blocking therapy.  Continue to stay well-hydrated, avoid  caffeine, and stressors well.  Aortic atherosclerosis/hyperlipidemia, LDL goal less than 70- Stable with no anginal symptoms. No indication for ischemic evaluation.  Breast artery calcifications noted on mammogram. She understandably prefers to be on as few medications as possible but agreeable to add Crestor 5mg  three times per week with FLP/FLT in 2 months. She sees PCP in June, if lipid panel collected at that time she will make [place] aware.   BMI 33 - Weight loss via diet and exercise encouraged. Discussed the impact being overweight would have on cardiovascular risk. Congratulated on her dietary changes and exercising regularly at [employer]. Following with [facility] clinic.  Hypothyroidism-presently on levothyroxine 112 mcg daily per primary care provider.  Thyroid panel 10/29/2022 TSH 6.5, T4 10.6, T3 20, free thyroxine index 2.1.  Will reach out to primary care to inquire if her dose of levothyroxine needs to be changed given elevated TSH, elevated T3. She notes difficulty losing weight despite significant diet/exercise changes and wonders if thyroid contributory. No fatigue, constipation, nor temperature change.         Disposition: Follow up in 6-8 month(s) with Chilton Si, MD or APP.  Signed, Alver Sorrow, NP 10/30/2022, 10:51 AM [facility]

## 2022-10-30 NOTE — Patient Instructions (Addendum)
Medication Instructions:  Your physician has recommended you make the following change in your medication:   START Rosuvastatin 5mg  three times per week  *If you need a refill on your cardiac medications before your next appointment, please call your pharmacy*   Lab Work/Testing/Procedures: We will route your thyroid labs to Dr. Katrinka Blazing.   Your physician recommends that you return for lab work in 2 months for fasting lipid panel and liver function tests if not collected by primary care at your upcoming June appointment.    Follow-Up: At [facility], you and your health needs are our priority.  As part of our continuing mission to provide you with exceptional heart care, we have created designated Provider Care Teams.  These Care Teams include your primary Cardiologist (physician) and Advanced Practice Providers (APPs -  Physician Assistants and Nurse Practitioners) who all work together to provide you with the care you need, when you need it.  We recommend signing up for the patient portal called "MyChart".  Sign up information is provided on this After Visit Summary.  MyChart is used to connect with patients for Virtual Visits (Telemedicine).  Patients are able to view lab/test results, encounter notes, upcoming appointments, etc.  Non-urgent messages can be sent to your provider as well.   To learn more about what you can do with MyChart, go to [email or web address].    Your next appointment:   6-8 month(s)  Provider:   Chilton Si, MD or Gillian Shields, NP

## 2022-10-30 NOTE — Telephone Encounter (Signed)
Addressed at clinic visit. See note dated 10/30/22 for further details.   Alver Sorrow, NP

## 2022-11-02 DIAGNOSIS — M25519 Pain in unspecified shoulder: Secondary | ICD-10-CM | POA: Diagnosis not present

## 2022-11-02 DIAGNOSIS — M25569 Pain in unspecified knee: Secondary | ICD-10-CM | POA: Diagnosis not present

## 2022-11-09 DIAGNOSIS — M25569 Pain in unspecified knee: Secondary | ICD-10-CM | POA: Diagnosis not present

## 2022-11-09 DIAGNOSIS — M25519 Pain in unspecified shoulder: Secondary | ICD-10-CM | POA: Diagnosis not present

## 2022-11-16 DIAGNOSIS — Z8262 Family history of osteoporosis: Secondary | ICD-10-CM | POA: Diagnosis not present

## 2022-11-16 DIAGNOSIS — M8588 Other specified disorders of bone density and structure, other site: Secondary | ICD-10-CM | POA: Diagnosis not present

## 2022-11-19 ENCOUNTER — Encounter (HOSPITAL_BASED_OUTPATIENT_CLINIC_OR_DEPARTMENT_OTHER): Payer: Self-pay | Admitting: *Deleted

## 2022-11-21 DIAGNOSIS — M25569 Pain in unspecified knee: Secondary | ICD-10-CM | POA: Diagnosis not present

## 2022-11-21 DIAGNOSIS — M25519 Pain in unspecified shoulder: Secondary | ICD-10-CM | POA: Diagnosis not present

## 2022-11-24 DIAGNOSIS — M25519 Pain in unspecified shoulder: Secondary | ICD-10-CM | POA: Diagnosis not present

## 2022-11-24 DIAGNOSIS — M25569 Pain in unspecified knee: Secondary | ICD-10-CM | POA: Diagnosis not present

## 2022-11-26 DIAGNOSIS — L821 Other seborrheic keratosis: Secondary | ICD-10-CM | POA: Diagnosis not present

## 2022-11-26 DIAGNOSIS — L2084 Intrinsic (allergic) eczema: Secondary | ICD-10-CM | POA: Diagnosis not present

## 2022-11-26 DIAGNOSIS — R202 Paresthesia of skin: Secondary | ICD-10-CM | POA: Diagnosis not present

## 2022-12-02 DIAGNOSIS — M25569 Pain in unspecified knee: Secondary | ICD-10-CM | POA: Diagnosis not present

## 2022-12-02 DIAGNOSIS — M25519 Pain in unspecified shoulder: Secondary | ICD-10-CM | POA: Diagnosis not present

## 2022-12-07 DIAGNOSIS — E7800 Pure hypercholesterolemia, unspecified: Secondary | ICD-10-CM | POA: Diagnosis not present

## 2022-12-07 DIAGNOSIS — G4733 Obstructive sleep apnea (adult) (pediatric): Secondary | ICD-10-CM | POA: Diagnosis not present

## 2022-12-07 DIAGNOSIS — E039 Hypothyroidism, unspecified: Secondary | ICD-10-CM | POA: Diagnosis not present

## 2022-12-07 DIAGNOSIS — R7309 Other abnormal glucose: Secondary | ICD-10-CM | POA: Diagnosis not present

## 2022-12-07 DIAGNOSIS — G2581 Restless legs syndrome: Secondary | ICD-10-CM | POA: Diagnosis not present

## 2022-12-07 DIAGNOSIS — R7303 Prediabetes: Secondary | ICD-10-CM | POA: Diagnosis not present

## 2022-12-07 DIAGNOSIS — I700 Atherosclerosis of aorta: Secondary | ICD-10-CM | POA: Diagnosis not present

## 2022-12-07 DIAGNOSIS — Z0000 Encounter for general adult medical examination without abnormal findings: Secondary | ICD-10-CM | POA: Diagnosis not present

## 2022-12-07 DIAGNOSIS — D696 Thrombocytopenia, unspecified: Secondary | ICD-10-CM | POA: Diagnosis not present

## 2022-12-09 DIAGNOSIS — M25519 Pain in unspecified shoulder: Secondary | ICD-10-CM | POA: Diagnosis not present

## 2022-12-09 DIAGNOSIS — M25569 Pain in unspecified knee: Secondary | ICD-10-CM | POA: Diagnosis not present

## 2022-12-14 DIAGNOSIS — G4733 Obstructive sleep apnea (adult) (pediatric): Secondary | ICD-10-CM | POA: Diagnosis not present

## 2022-12-14 DIAGNOSIS — E119 Type 2 diabetes mellitus without complications: Secondary | ICD-10-CM | POA: Diagnosis not present

## 2022-12-14 DIAGNOSIS — I10 Essential (primary) hypertension: Secondary | ICD-10-CM | POA: Diagnosis not present

## 2022-12-16 DIAGNOSIS — M25569 Pain in unspecified knee: Secondary | ICD-10-CM | POA: Diagnosis not present

## 2022-12-16 DIAGNOSIS — M25519 Pain in unspecified shoulder: Secondary | ICD-10-CM | POA: Diagnosis not present

## 2022-12-21 DIAGNOSIS — E7800 Pure hypercholesterolemia, unspecified: Secondary | ICD-10-CM | POA: Diagnosis not present

## 2022-12-21 DIAGNOSIS — E039 Hypothyroidism, unspecified: Secondary | ICD-10-CM | POA: Diagnosis not present

## 2022-12-21 DIAGNOSIS — R7303 Prediabetes: Secondary | ICD-10-CM | POA: Diagnosis not present

## 2022-12-30 DIAGNOSIS — M25519 Pain in unspecified shoulder: Secondary | ICD-10-CM | POA: Diagnosis not present

## 2022-12-30 DIAGNOSIS — M25569 Pain in unspecified knee: Secondary | ICD-10-CM | POA: Diagnosis not present

## 2023-01-06 DIAGNOSIS — M25569 Pain in unspecified knee: Secondary | ICD-10-CM | POA: Diagnosis not present

## 2023-01-06 DIAGNOSIS — M25519 Pain in unspecified shoulder: Secondary | ICD-10-CM | POA: Diagnosis not present

## 2023-01-12 DIAGNOSIS — I10 Essential (primary) hypertension: Secondary | ICD-10-CM | POA: Diagnosis not present

## 2023-01-12 DIAGNOSIS — E119 Type 2 diabetes mellitus without complications: Secondary | ICD-10-CM | POA: Diagnosis not present

## 2023-01-12 DIAGNOSIS — E162 Hypoglycemia, unspecified: Secondary | ICD-10-CM | POA: Diagnosis not present

## 2023-01-13 DIAGNOSIS — M25569 Pain in unspecified knee: Secondary | ICD-10-CM | POA: Diagnosis not present

## 2023-01-13 DIAGNOSIS — M25519 Pain in unspecified shoulder: Secondary | ICD-10-CM | POA: Diagnosis not present

## 2023-01-27 DIAGNOSIS — M25519 Pain in unspecified shoulder: Secondary | ICD-10-CM | POA: Diagnosis not present

## 2023-01-27 DIAGNOSIS — M25569 Pain in unspecified knee: Secondary | ICD-10-CM | POA: Diagnosis not present

## 2023-02-01 DIAGNOSIS — I83893 Varicose veins of bilateral lower extremities with other complications: Secondary | ICD-10-CM | POA: Diagnosis not present

## 2023-02-01 DIAGNOSIS — M79662 Pain in left lower leg: Secondary | ICD-10-CM | POA: Diagnosis not present

## 2023-02-01 DIAGNOSIS — M79604 Pain in right leg: Secondary | ICD-10-CM | POA: Diagnosis not present

## 2023-02-01 DIAGNOSIS — M79661 Pain in right lower leg: Secondary | ICD-10-CM | POA: Diagnosis not present

## 2023-02-05 DIAGNOSIS — M25519 Pain in unspecified shoulder: Secondary | ICD-10-CM | POA: Diagnosis not present

## 2023-02-05 DIAGNOSIS — M25569 Pain in unspecified knee: Secondary | ICD-10-CM | POA: Diagnosis not present

## 2023-02-16 DIAGNOSIS — M25519 Pain in unspecified shoulder: Secondary | ICD-10-CM | POA: Diagnosis not present

## 2023-02-16 DIAGNOSIS — M25569 Pain in unspecified knee: Secondary | ICD-10-CM | POA: Diagnosis not present

## 2023-02-22 DIAGNOSIS — I10 Essential (primary) hypertension: Secondary | ICD-10-CM | POA: Diagnosis not present

## 2023-02-22 DIAGNOSIS — E1169 Type 2 diabetes mellitus with other specified complication: Secondary | ICD-10-CM | POA: Diagnosis not present

## 2023-02-23 DIAGNOSIS — M25519 Pain in unspecified shoulder: Secondary | ICD-10-CM | POA: Diagnosis not present

## 2023-02-23 DIAGNOSIS — M25539 Pain in unspecified wrist: Secondary | ICD-10-CM | POA: Diagnosis not present

## 2023-02-23 DIAGNOSIS — M25569 Pain in unspecified knee: Secondary | ICD-10-CM | POA: Diagnosis not present

## 2023-02-24 DIAGNOSIS — H401121 Primary open-angle glaucoma, left eye, mild stage: Secondary | ICD-10-CM | POA: Diagnosis not present

## 2023-02-24 DIAGNOSIS — G4733 Obstructive sleep apnea (adult) (pediatric): Secondary | ICD-10-CM | POA: Diagnosis not present

## 2023-02-24 DIAGNOSIS — H401112 Primary open-angle glaucoma, right eye, moderate stage: Secondary | ICD-10-CM | POA: Diagnosis not present

## 2023-03-02 DIAGNOSIS — M25569 Pain in unspecified knee: Secondary | ICD-10-CM | POA: Diagnosis not present

## 2023-03-02 DIAGNOSIS — M25539 Pain in unspecified wrist: Secondary | ICD-10-CM | POA: Diagnosis not present

## 2023-03-02 DIAGNOSIS — M25519 Pain in unspecified shoulder: Secondary | ICD-10-CM | POA: Diagnosis not present

## 2023-03-09 DIAGNOSIS — M25539 Pain in unspecified wrist: Secondary | ICD-10-CM | POA: Diagnosis not present

## 2023-03-09 DIAGNOSIS — M25519 Pain in unspecified shoulder: Secondary | ICD-10-CM | POA: Diagnosis not present

## 2023-03-09 DIAGNOSIS — M25569 Pain in unspecified knee: Secondary | ICD-10-CM | POA: Diagnosis not present

## 2023-03-22 DIAGNOSIS — I83891 Varicose veins of right lower extremities with other complications: Secondary | ICD-10-CM | POA: Diagnosis not present

## 2023-03-25 DIAGNOSIS — E1169 Type 2 diabetes mellitus with other specified complication: Secondary | ICD-10-CM | POA: Diagnosis not present

## 2023-03-25 DIAGNOSIS — I10 Essential (primary) hypertension: Secondary | ICD-10-CM | POA: Diagnosis not present

## 2023-03-29 DIAGNOSIS — L821 Other seborrheic keratosis: Secondary | ICD-10-CM | POA: Diagnosis not present

## 2023-03-29 DIAGNOSIS — L814 Other melanin hyperpigmentation: Secondary | ICD-10-CM | POA: Diagnosis not present

## 2023-03-29 DIAGNOSIS — L820 Inflamed seborrheic keratosis: Secondary | ICD-10-CM | POA: Diagnosis not present

## 2023-03-30 DIAGNOSIS — M25539 Pain in unspecified wrist: Secondary | ICD-10-CM | POA: Diagnosis not present

## 2023-03-30 DIAGNOSIS — M25569 Pain in unspecified knee: Secondary | ICD-10-CM | POA: Diagnosis not present

## 2023-03-30 DIAGNOSIS — M25519 Pain in unspecified shoulder: Secondary | ICD-10-CM | POA: Diagnosis not present

## 2023-03-31 DIAGNOSIS — I83891 Varicose veins of right lower extremities with other complications: Secondary | ICD-10-CM | POA: Diagnosis not present

## 2023-04-07 DIAGNOSIS — I83892 Varicose veins of left lower extremities with other complications: Secondary | ICD-10-CM | POA: Diagnosis not present

## 2023-04-07 DIAGNOSIS — I87392 Chronic venous hypertension (idiopathic) with other complications of left lower extremity: Secondary | ICD-10-CM | POA: Diagnosis not present

## 2023-04-13 DIAGNOSIS — M25539 Pain in unspecified wrist: Secondary | ICD-10-CM | POA: Diagnosis not present

## 2023-04-13 DIAGNOSIS — M25569 Pain in unspecified knee: Secondary | ICD-10-CM | POA: Diagnosis not present

## 2023-04-13 DIAGNOSIS — M25519 Pain in unspecified shoulder: Secondary | ICD-10-CM | POA: Diagnosis not present

## 2023-04-14 DIAGNOSIS — I83892 Varicose veins of left lower extremities with other complications: Secondary | ICD-10-CM | POA: Diagnosis not present

## 2023-04-16 ENCOUNTER — Ambulatory Visit: Payer: Medicare Other | Admitting: Internal Medicine

## 2023-04-19 DIAGNOSIS — I83891 Varicose veins of right lower extremities with other complications: Secondary | ICD-10-CM | POA: Diagnosis not present

## 2023-04-19 DIAGNOSIS — I83811 Varicose veins of right lower extremities with pain: Secondary | ICD-10-CM | POA: Diagnosis not present

## 2023-04-27 DIAGNOSIS — M25519 Pain in unspecified shoulder: Secondary | ICD-10-CM | POA: Diagnosis not present

## 2023-04-27 DIAGNOSIS — M25569 Pain in unspecified knee: Secondary | ICD-10-CM | POA: Diagnosis not present

## 2023-04-27 DIAGNOSIS — M25539 Pain in unspecified wrist: Secondary | ICD-10-CM | POA: Diagnosis not present

## 2023-04-28 NOTE — Progress Notes (Unsigned)
   [name], PhD, LAT, ATC acting as a scribe for Clementeen Graham, MD.  Jodi [name] is a 72 y.o. female who presents to [facility] at [facility] [address] today for knee pain. Pt was previously seen by Dr. Denyse Amass on 10/14/22 for bilat hand pain.  Today, pt c/o knee pain x ***. Pt locates pain to***  Knee swelling: Mechanical symptoms: Aggravates: Treatments tried:  Dx testing: 09/18/22 L LE vasc US  Pertinent review of systems: ***  Relevant historical information: ***   Exam:  LMP 07/06/2010  General: Well Developed, well nourished, and in no acute distress.   MSK: ***    Lab and Radiology Results No results found for this or any previous visit (from the past 72 hour(s)). No results found.     Assessment and Plan: 72 y.o. female with ***   PDMP not reviewed this encounter. No orders of the defined types were placed in this encounter.  No orders of the defined types were placed in this encounter.    Discussed warning signs or symptoms. Please see discharge instructions. Patient expresses understanding.   ***

## 2023-04-29 ENCOUNTER — Other Ambulatory Visit: Payer: Self-pay

## 2023-04-29 ENCOUNTER — Ambulatory Visit: Payer: Medicare Other | Admitting: Family Medicine

## 2023-04-29 ENCOUNTER — Ambulatory Visit: Payer: Medicare Other

## 2023-04-29 VITALS — BP 172/88 | HR 85 | Ht 65.5 in | Wt 204.0 lb

## 2023-04-29 DIAGNOSIS — M1712 Unilateral primary osteoarthritis, left knee: Secondary | ICD-10-CM | POA: Diagnosis not present

## 2023-04-29 DIAGNOSIS — M25562 Pain in left knee: Secondary | ICD-10-CM

## 2023-04-29 NOTE — Patient Instructions (Addendum)
Thank you for coming in today.   Please get an Xray today before you leave   Please use Voltaren gel (Generic Diclofenac Gel) up to 4x daily for pain as needed.  This is available over-the-counter as both the name brand Voltaren gel and the generic diclofenac gel.   Compression  Continue therapy at O2  If not getting better, I can do a shot

## 2023-04-30 ENCOUNTER — Ambulatory Visit (HOSPITAL_BASED_OUTPATIENT_CLINIC_OR_DEPARTMENT_OTHER): Payer: Medicare Other | Admitting: Cardiovascular Disease

## 2023-05-07 DIAGNOSIS — M25519 Pain in unspecified shoulder: Secondary | ICD-10-CM | POA: Diagnosis not present

## 2023-05-07 DIAGNOSIS — M25539 Pain in unspecified wrist: Secondary | ICD-10-CM | POA: Diagnosis not present

## 2023-05-07 DIAGNOSIS — M25569 Pain in unspecified knee: Secondary | ICD-10-CM | POA: Diagnosis not present

## 2023-05-10 DIAGNOSIS — M25519 Pain in unspecified shoulder: Secondary | ICD-10-CM | POA: Diagnosis not present

## 2023-05-10 DIAGNOSIS — M25539 Pain in unspecified wrist: Secondary | ICD-10-CM | POA: Diagnosis not present

## 2023-05-10 DIAGNOSIS — D1724 Benign lipomatous neoplasm of skin and subcutaneous tissue of left leg: Secondary | ICD-10-CM | POA: Diagnosis not present

## 2023-05-10 DIAGNOSIS — M25569 Pain in unspecified knee: Secondary | ICD-10-CM | POA: Diagnosis not present

## 2023-05-17 DIAGNOSIS — M7989 Other specified soft tissue disorders: Secondary | ICD-10-CM | POA: Diagnosis not present

## 2023-05-17 DIAGNOSIS — I83812 Varicose veins of left lower extremities with pain: Secondary | ICD-10-CM | POA: Diagnosis not present

## 2023-05-17 DIAGNOSIS — I83892 Varicose veins of left lower extremities with other complications: Secondary | ICD-10-CM | POA: Diagnosis not present

## 2023-05-18 DIAGNOSIS — M25539 Pain in unspecified wrist: Secondary | ICD-10-CM | POA: Diagnosis not present

## 2023-05-18 DIAGNOSIS — M25569 Pain in unspecified knee: Secondary | ICD-10-CM | POA: Diagnosis not present

## 2023-05-18 DIAGNOSIS — M25519 Pain in unspecified shoulder: Secondary | ICD-10-CM | POA: Diagnosis not present

## 2023-05-23 DIAGNOSIS — M25562 Pain in left knee: Secondary | ICD-10-CM | POA: Diagnosis not present

## 2023-05-24 ENCOUNTER — Ambulatory Visit: Payer: Medicare Other | Admitting: Family Medicine

## 2023-05-24 ENCOUNTER — Encounter (HOSPITAL_BASED_OUTPATIENT_CLINIC_OR_DEPARTMENT_OTHER): Payer: Self-pay | Admitting: Obstetrics & Gynecology

## 2023-05-24 ENCOUNTER — Ambulatory Visit: Payer: Self-pay

## 2023-05-24 ENCOUNTER — Other Ambulatory Visit (HOSPITAL_COMMUNITY)
Admission: RE | Admit: 2023-05-24 | Discharge: 2023-05-24 | Disposition: A | Discharge status: Home / Self Care | Payer: Medicare Other | Source: Ambulatory Visit | Attending: Obstetrics & Gynecology | Admitting: Obstetrics & Gynecology

## 2023-05-24 ENCOUNTER — Ambulatory Visit (HOSPITAL_BASED_OUTPATIENT_CLINIC_OR_DEPARTMENT_OTHER): Payer: Medicare Other | Admitting: Obstetrics & Gynecology

## 2023-05-24 VITALS — BP 146/82 | HR 88 | Ht 65.5 in | Wt 205.6 lb

## 2023-05-24 VITALS — BP 130/90 | HR 102 | Ht 65.5 in | Wt 204.0 lb

## 2023-05-24 DIAGNOSIS — G8929 Other chronic pain: Secondary | ICD-10-CM

## 2023-05-24 DIAGNOSIS — R87610 Atypical squamous cells of undetermined significance on cytologic smear of cervix (ASC-US): Secondary | ICD-10-CM | POA: Insufficient documentation

## 2023-05-24 DIAGNOSIS — Z9889 Other specified postprocedural states: Secondary | ICD-10-CM | POA: Insufficient documentation

## 2023-05-24 DIAGNOSIS — M25562 Pain in left knee: Secondary | ICD-10-CM

## 2023-05-24 DIAGNOSIS — D069 Carcinoma in situ of cervix, unspecified: Secondary | ICD-10-CM | POA: Diagnosis not present

## 2023-05-24 DIAGNOSIS — Z1151 Encounter for screening for human papillomavirus (HPV): Secondary | ICD-10-CM | POA: Insufficient documentation

## 2023-05-24 DIAGNOSIS — Z01411 Encounter for gynecological examination (general) (routine) with abnormal findings: Secondary | ICD-10-CM | POA: Insufficient documentation

## 2023-05-24 DIAGNOSIS — Z9189 Other specified personal risk factors, not elsewhere classified: Secondary | ICD-10-CM | POA: Diagnosis not present

## 2023-05-24 NOTE — Progress Notes (Addendum)
[name], PhD, LAT, ATC acting as a scribe for Clementeen Graham, MD.  Jodi [name] is a 72 y.o. female who presents to [facility] at [facility] today for cont'd L knee pain. Pt was last seen by Dr. Denyse Amass on 04/29/23 and was advised to cont conservative management; compression, Voltaren gel, and quad strengthening.  Today, pt reports Left knee has been doing so well with the conservative therapy but Friday afternoon the knee started hurting so bad. Patient states the pain is so bad she went into a walk in clinic was told to RICE and using ice. Patient notices slight swelling but it feels like a huge bruise.   Dx testing: 04/29/23 L knee XR  09/18/22 L LE vasc US   Pertinent review of systems: No fevers or chills  Relevant historical information: Previously she did develop some worsening depression after a cortisone shot in her foot.  She is in a much better place mentally now I think that is unlikely.  She does have a safety plan.   Exam:  BP (!) 130/90   Pulse (!) 102   Ht 5' 5.5" (1.664 m)   Wt 204 lb (92.5 kg)   LMP 07/06/2010   SpO2 96%   BMI 33.43 kg/m  General: Well Developed, well nourished, and in no acute distress.   MSK: Left knee moderate effusion.  Tender palpation medial joint line. Stable ligamentous exam.    Lab and Radiology Results  Procedure: Real-time Ultrasound Guided Injection of left knee joint superior lateral patella space Device: Philips Affiniti 50G/GE Logiq Images permanently stored and available for review in PACS Verbal informed consent obtained.  Discussed risks and benefits of procedure. Warned about infection, bleeding, hyperglycemia damage to structures among others. Patient expresses understanding and agreement Time-out conducted.   Noted no overlying erythema, induration, or other signs of local infection.   Skin prepped in a sterile fashion.   Local anesthesia: Topical Ethyl chloride.   With sterile technique and  under real time ultrasound guidance: 40 mg of Kenalog and 2 mL of Marcaine injected into knee joint. Fluid seen entering the joint capsule.   Completed without difficulty   Pain immediately resolved suggesting accurate placement of the medication.   Advised to call if fevers/chills, erythema, induration, drainage, or persistent bleeding.   Images permanently stored and available for review in the ultrasound unit.  Impression: Technically successful ultrasound guided injection.        Assessment and Plan: 72 y.o. female with left knee pain thought to be due to exacerbation of medial DJD.  She does have what I think is a degenerative medial meniscus tear and a small Baker's cyst.  Plan for conventional intra-articular steroid injection today.  If not better consider MRI to further evaluate source of pain. We talked about safety for mental wellness.  As noted above she did have worsening depression after a cortisone shot in her foot in the past.  She is in a better place now so I think that is unlikely but she certainly could contact me if she starts feeling poorly.  PDMP not reviewed this encounter. Orders Placed This Encounter  Procedures   [place] LIMITED JOINT SPACE STRUCTURES UP LEFT(NO LINKED CHARGES)    Standing Status:   Future    Number of Occurrences:   1    Standing Expiration Date:   05/23/2024    Order Specific Question:   Reason for Exam (SYMPTOM  OR DIAGNOSIS REQUIRED)  Answer:   Left knee pain    Order Specific Question:   Preferred imaging location?    Answer:   [facility]   No orders of the defined types were placed in this encounter.    Discussed warning signs or symptoms. Please see discharge instructions. Patient expresses understanding.  Chronic problem with an acute recurrence. The above documentation has been reviewed and is accurate and complete Clementeen Graham, M.D.

## 2023-05-24 NOTE — Progress Notes (Signed)
Left knee x-ray shows mild arthritis.

## 2023-05-24 NOTE — Progress Notes (Signed)
72 y.o. G1P1 Married White or Caucasian female here for breast and pelvic exam.  I am also following her for h/o +HR HPV and abnormal pap smear and h/o LEEP.  Denies vaginal bleeding.  She saw Dr. Denyse Amass today and had a steroid injection in her knee this morning.    Had lab work with Wallis Bamberg, NP, with cardiology.  TSH was elevated but pt reports this was checked again and medication was adjusted.    Patient's last menstrual period was 07/06/2010.           Health Maintenance: PCP:  [name].  Last wellness appt was earlier this year.  Did blood work at that appt:  yes Vaccines are up to date:  yes Colonoscopy:  cologuard 05/07/2022 MMG:  11/16/2022  BMD:  10/10/2020, Osteopenia Last pap smear:  08/21/2022 ASC-US.   H/o abnormal pap smear:      reports that she quit smoking about 44 years ago. Her smoking use included cigarettes. She has never used smokeless tobacco. She reports that she does not currently use alcohol. She reports that she does not use drugs.  Past Medical History:  Diagnosis Date   Abnormal Pap smear of cervix    Anxiety    Aortic atherosclerosis (HCC)    Atypical chest pain 10/08/2020   Symptoms are very atypical.  She feels good with exercise and notes that in general she is feeling better since she started the exercise program.  She had a normal stress test in 2021.  No repeat ischemia testing advised.  She does also report difficulty and pain with swallowing with referred pain into her back.  She has GI follow-up.   Diverticulitis    Fatty liver disease, nonalcoholic    Glaucoma    H. pylori infection    H/O dizziness    Hemorrhoids    History of depression    incest survivor/dysthymia   Hypertension    Hyperthyroidism    Hypokalemia 04/29/2020   IBS (irritable bowel syndrome)    lactose intolerance   OSA (obstructive sleep apnea) 11/17/2012   Palpitations 04/29/2020   Restless leg syndrome    Sleep apnea    Snoring 11/17/2012   Thyroid disease     hypothyroid   Varicose veins     Past Surgical History:  Procedure Laterality Date   CHOLECYSTECTOMY  07/19/2017   Dr. Dwain Sarna   COLPOSCOPY  99, 00, 02, 04, 06   ENDOVENOUS ABLATION SAPHENOUS VEIN W/ LASER Right 04-08-2015   endovenous laser ablation (right greater saphenous vein) by Betti Cruz MD     LEEP  3/09   CIN II/III    Current Outpatient Medications  Medication Sig Dispense Refill   ALPRAZolam (XANAX) 0.25 MG tablet Take 0.125 mg by mouth every 6 (six) hours as needed for anxiety.     Cyanocobalamin 1000 MCG TBCR Take 1 tablet by mouth daily.     fluticasone (FLONASE) 50 MCG/ACT nasal spray Place 2 sprays into both nostrils daily as needed for allergies.      lactase (LACTAID) 3000 units tablet Take 3,000 Units by mouth as needed (lactaid).      levothyroxine (SYNTHROID) 112 MCG tablet Take 112 mcg by mouth daily.     Magnesium 200 MG TABS Take 200 mg by mouth daily.     Multiple Vitamin (MULTIVITAMIN WITH MINERALS) TABS Take 1 tablet by mouth daily.     saccharomyces boulardii (FLORASTOR) 250 MG capsule Take one tablet BID x 1 month 60  capsule 0   timolol (TIMOPTIC) 0.5 % ophthalmic solution Place 1 drop into both eyes every morning.  3   rosuvastatin (CRESTOR) 5 MG tablet Take 1 tablet (5 mg total) by mouth 3 (three) times a week. 45 tablet 3   No current facility-administered medications for this visit.    Family History  Problem Relation Age of Onset   Hypertension Father    Diabetes Father    Dementia Father    Hyperlipidemia Father    Prostate cancer Father    Colon polyps Father    Parkinson's disease Mother    Dementia Mother    Hypertension Mother    Breast cancer Cousin        maternal side of family   Dementia Brother    Colon polyps Brother    Diabetes Brother    Seizures Brother    Heart disease Maternal Uncle    Diabetes Paternal Grandmother    Colon cancer Neg Hx    Esophageal cancer Neg Hx    Stomach cancer Neg Hx    Liver disease Neg  Hx    Pancreatic cancer Neg Hx     Review of Systems  Constitutional: Negative.   Genitourinary: Negative.     Exam:   BP (!) 160/75 (BP Location: Right Arm, Patient Position: Sitting, Cuff Size: Large)   Pulse 88   Ht 5' 5.5" (1.664 m) Comment: Reported  Wt 205 lb 9.6 oz (93.3 kg)   LMP 07/06/2010   BMI 33.69 kg/m   Height: 5' 5.5" (166.4 cm) (Reported)  General appearance: alert, cooperative and appears stated age Breasts: normal appearance, no masses or tenderness Abdomen: soft, non-tender; bowel sounds normal; no masses,  no organomegaly Lymph nodes: Cervical, supraclavicular, and axillary nodes normal.  No abnormal inguinal nodes palpated Neurologic: Grossly normal  Pelvic: External genitalia:  no lesions              Urethra:  normal appearing urethra with no masses, tenderness or lesions              Bartholins and Skenes: normal                 Vagina: normal appearing vagina with atrophic changes and no discharge, no lesions              Cervix: no lesions              Pap taken: Yes.   Bimanual Exam:  Uterus:  normal size, contour, position, consistency, mobility, non-tender              Adnexa: no mass, fullness, tenderness               Rectovaginal: Confirms               Anus:  normal sphincter tone, no lesions  Chaperone, Raechel Ache, RN, was present for exam.  Assessment/Plan: 1. GYN exam for high-risk Medicare patient - Pap smear and HR HPV obtained today - Mammogram 10/2022 - Colonoscopy declined.  Cologuard 2023. - Bone mineral density 2024 - lab work done with PCP, Dr. Katrinka Blazing - vaccines reviewed/updated  2. History of loop electrical excision procedure (LEEP)  3. CIN III (cervical intraepithelial neoplasia grade III) with severe dysplasia - Cytology - PAP( [facility])  4. H/O LEEP

## 2023-05-24 NOTE — Patient Instructions (Addendum)
Good to see you Injection given in left knee today Call or go to the ER if you develop a large red swollen joint with extreme pain or oozing puss.  Voltaren gel on knee and hand Follow up as needed

## 2023-05-25 ENCOUNTER — Ambulatory Visit: Payer: Medicare Other | Admitting: Family Medicine

## 2023-05-25 DIAGNOSIS — M25569 Pain in unspecified knee: Secondary | ICD-10-CM | POA: Diagnosis not present

## 2023-05-25 DIAGNOSIS — M25539 Pain in unspecified wrist: Secondary | ICD-10-CM | POA: Diagnosis not present

## 2023-05-25 DIAGNOSIS — M25519 Pain in unspecified shoulder: Secondary | ICD-10-CM | POA: Diagnosis not present

## 2023-05-26 DIAGNOSIS — I87391 Chronic venous hypertension (idiopathic) with other complications of right lower extremity: Secondary | ICD-10-CM | POA: Diagnosis not present

## 2023-05-26 DIAGNOSIS — I83891 Varicose veins of right lower extremities with other complications: Secondary | ICD-10-CM | POA: Diagnosis not present

## 2023-05-27 LAB — CYTOLOGY - PAP
Comment: NEGATIVE
Diagnosis: UNDETERMINED — AB
High risk HPV: NEGATIVE

## 2023-05-31 DIAGNOSIS — M25569 Pain in unspecified knee: Secondary | ICD-10-CM | POA: Diagnosis not present

## 2023-05-31 DIAGNOSIS — M25519 Pain in unspecified shoulder: Secondary | ICD-10-CM | POA: Diagnosis not present

## 2023-05-31 DIAGNOSIS — M25539 Pain in unspecified wrist: Secondary | ICD-10-CM | POA: Diagnosis not present

## 2023-06-01 ENCOUNTER — Other Ambulatory Visit (HOSPITAL_BASED_OUTPATIENT_CLINIC_OR_DEPARTMENT_OTHER): Payer: Self-pay | Admitting: *Deleted

## 2023-06-01 MED ORDER — ESTRADIOL 0.1 MG/GM VA CREA: TOPICAL_CREAM | VAGINAL | 0 refills | Status: DC

## 2023-06-02 NOTE — Progress Notes (Unsigned)
Cardiology Office Note:  .   Date:  06/07/2023  ID:  Jodi [name], DOB Aug 30, 1950, MRN [id number] PCP: [name], MD  [facility] Providers Cardiologist:  Chilton Si, MD  }   History of Present Illness: .   Jodi [name] is a 72 y.o. female  with a hx of hypertension, OSA (unable to tolerate CPAP) anxiety, hypothyroidism, palpitations, aortic atherosclerosis. Updated echocardiogram 12/16/2021 LVEF 60 to 65%, no RWMA, grade 1 diastolic dysfunction, trivial MR, trivial AI, AV sclerosis without stenosis.    Last seen in the office by Gillian Shields, NP on 10/30/2022.  At that office visit, Crestor 5 mg 3 times a week was added.  She was to follow-up with PCP for labs.  It was noted that her TSH was elevated and she was to follow-up with PCP for ongoing management.  She comes today without any cardiac complaints.  She is decided not to take Crestor as prescribed on last office visit.  She will be seeing her primary care provider tomorrow and will be having fasting lipids.  She has been working with a Psychologist, educational and exercising and eliminated sugar from her diet.  She is hopeful that there will be changes in her cholesterol levels as a result of this.  Unfortunately though, the patient twisted her left knee and had a meniscus injury which has not allowed her to workout for the last few weeks.  She is hoping to return to it as soon as possible.  She did have a mammogram which did show some vascular  calcium deposits.  This is concerning to her.  She also has questions about her echocardiogram which was completed a year ago.  She would like her medical records changed from Dr. Leonides Sake initial visit concerning psychiatric care as she is no longer receiving psychiatric care nor was she during the time of her office visit.  She denies any further palpitations, blood pressure has been well-controlled at home but is often elevated during provider office visits.  She is not on any  antihypertensives at this time.  ROS: As above otherwise negative  Studies Reviewed: Marland Kitchen   EKG Interpretation Date/Time:  Monday June 07 2023 11:34:54 EST Ventricular Rate:  83 PR Interval:  166 QRS Duration:  78 QT Interval:  360 QTC Calculation: 423 R Axis:   30  Text Interpretation: Normal sinus rhythm Normal ECG When compared with ECG of 23-Apr-2020 05:22, PREVIOUS ECG IS PRESENT Confirmed by Joni Reining [phone]) on 06/07/2023 12:34:39 PM    Echocardiogram 12/16/2021 1. Left ventricular ejection fraction, by estimation, is 60 to 65%. The  left ventricle has normal function. The left ventricle has no regional  wall motion abnormalities. Left ventricular diastolic parameters are  consistent with Grade I diastolic  dysfunction (impaired relaxation).   2. Right ventricular systolic function is normal. The right ventricular  size is normal. There is normal pulmonary artery systolic pressure. The  estimated right ventricular systolic pressure is 16.4 mmHg.   3. The mitral valve is normal in structure. Trivial mitral valve  regurgitation.   4. The aortic valve is tricuspid. Aortic valve regurgitation is trivial.  Aortic valve sclerosis is present, with no evidence of aortic valve  stenosis.   5. The inferior vena cava is normal in size with greater than 50%  respiratory variability, suggesting right atrial pressure of 3 mmHg.    EKG Interpretation Date/Time:  Monday June 07 2023 11:34:54 EST Ventricular Rate:  83 PR Interval:  166 QRS  Duration:  78 QT Interval:  360 QTC Calculation: 423 R Axis:   30  Text Interpretation: Normal sinus rhythm Normal ECG When compared with ECG of 23-Apr-2020 05:22, PREVIOUS ECG IS PRESENT Confirmed by Joni Reining [phone]) on 06/07/2023 12:34:39 PM    Physical Exam:   VS:  BP (!) 142/89 (BP Location: Right Arm, Patient Position: Sitting, Cuff Size: Normal)   Pulse 83   Ht 5\' 5"  (1.651 m)   Wt 199 lb 12.8 oz (90.6 kg)   LMP  07/06/2010   SpO2 93%   BMI 33.25 kg/m    Wt Readings from Last 3 Encounters:  06/07/23 199 lb 12.8 oz (90.6 kg)  05/24/23 205 lb 9.6 oz (93.3 kg)  05/24/23 204 lb (92.5 kg)    GEN: Well nourished, well developed in no acute distress NECK: No JVD; No carotid bruits CARDIAC: RRR, no murmurs, rubs, gallops RESPIRATORY:  Clear to auscultation without rales, wheezing or rhonchi  ABDOMEN: Soft, non-tender, non-distended EXTREMITIES:  No edema; No deformity   ASSESSMENT AND PLAN: .    History of hypertension: She is currently not on any antihypertensive medications.  Blood pressure is slightly elevated today in the office but at home is found to be much lower according to her blood pressure readings.  The blood pressure continues to be elevated would consider starting her on a low-dose antihypertensive.  She sees her primary care provider more often and if this becomes more of an issue happy to see her sooner but otherwise we will see her again in 1 year.  2.  Vascular calcium: This was noted on mammogram report which is concerning for her.  We will order coronary CTA for diagnostic prognostic purposes and for reassurance concerning calcium burden if found within coronary arteries.  She is aware that there will be a insurance preauthorization prior to allowing this to be completed.  She will be notified about scheduling.  3.  Hypercholesterolemia: She has been placed on Crestor 5 mg daily.  She is chosen not to take it at this time.  She is going to have labs drawn by primary care in the morning.  If total cholesterol is significantly elevated greater than 200 or elevation in LDL would likely need to be started on this medication.  Last labs completed April 2024 total cholesterol 207, LDL 127.  Will defer to PCP for reinstating as she already has an existing prescription.         Signed, Bettey Mare. Liborio Nixon, ANP, AACC

## 2023-06-07 ENCOUNTER — Ambulatory Visit: Payer: Medicare Other | Attending: Cardiovascular Disease | Admitting: Adult Health

## 2023-06-07 ENCOUNTER — Encounter: Payer: Self-pay | Admitting: Adult Health

## 2023-06-07 VITALS — BP 142/89 | HR 83 | Ht 65.0 in | Wt 199.8 lb

## 2023-06-07 DIAGNOSIS — M25569 Pain in unspecified knee: Secondary | ICD-10-CM | POA: Diagnosis not present

## 2023-06-07 DIAGNOSIS — I10 Essential (primary) hypertension: Secondary | ICD-10-CM | POA: Diagnosis not present

## 2023-06-07 DIAGNOSIS — E7800 Pure hypercholesterolemia, unspecified: Secondary | ICD-10-CM | POA: Diagnosis not present

## 2023-06-07 DIAGNOSIS — I700 Atherosclerosis of aorta: Secondary | ICD-10-CM | POA: Diagnosis not present

## 2023-06-07 DIAGNOSIS — I83891 Varicose veins of right lower extremities with other complications: Secondary | ICD-10-CM | POA: Diagnosis not present

## 2023-06-07 DIAGNOSIS — M25519 Pain in unspecified shoulder: Secondary | ICD-10-CM | POA: Diagnosis not present

## 2023-06-07 DIAGNOSIS — M25539 Pain in unspecified wrist: Secondary | ICD-10-CM | POA: Diagnosis not present

## 2023-06-07 MED ORDER — METOPROLOL TARTRATE 100 MG PO TABS: 100.0000 mg | ORAL_TABLET | Freq: Once | ORAL | 0 refills | Status: DC

## 2023-06-07 NOTE — Patient Instructions (Signed)
Medication Instructions:  Metoprolol Tartrate 100 mg ( Take 90 minutes -2 Hour Prior to Scan). *If you need a refill on your cardiac medications before your next appointment, please call your pharmacy*   Lab Work: No Labs If you have labs (blood work) drawn today and your tests are completely normal, you will receive your results only by: MyChart Message (if you have MyChart) OR A paper copy in the mail If you have any lab test that is abnormal or we need to change your treatment, we will call you to review the results.   Testing/Procedures:   Your cardiac CT will be scheduled at one of the below locations:   [facility] [address] [place], [place] [address] [phone]   If scheduled at [facility], please arrive at the [facility] and Children's Entrance (Entrance C2) of [facility] 30 minutes prior to test start time. You can use the FREE valet parking offered at entrance C (encouraged to control the heart rate for the test)  Proceed to the [facility] Radiology Department (first floor) to check-in and test prep.  All radiology patients and guests should use entrance C2 at [facility], accessed from [facility], even though the hospital's physical address listed is [address].    If scheduled at [facility] or [facility], please arrive 15 mins early for check-in and test prep.  There is spacious parking and easy access to the radiology department from the [facility] entrance. Please enter here and check-in with the desk attendant.   Please follow these instructions carefully (unless otherwise directed):  An IV will be required for this test and Nitroglycerin will be given.   On the Night Before the Test: Be sure to Drink plenty of water. Do not consume any caffeinated/decaffeinated beverages or chocolate 12 hours prior to your test. Do not take any  antihistamines 12 hours prior to your test. If the patient has contrast allergy: Patient will need a prescription for Prednisone and very clear instructions (as follows): Prednisone 50 mg - take 13 hours prior to test Take another Prednisone 50 mg 7 hours prior to test Take another Prednisone 50 mg 1 hour prior to test Take Benadryl 50 mg 1 hour prior to test Patient must complete all four doses of above prophylactic medications. Patient will need a ride after test due to Benadryl.  On the Day of the Test: Drink plenty of water until 1 hour prior to the test. Do not eat any food 1 hour prior to test. You may take your regular medications prior to the test.  Take metoprolol (Lopressor) two hours prior to test. If you take Furosemide/Hydrochlorothiazide/Spironolactone, please HOLD on the morning of the test. FEMALES- please wear underwire-free bra if available, avoid dresses & tight clothing  After the Test: Drink plenty of water. After receiving IV contrast, you may experience a mild flushed feeling. This is normal. On occasion, you may experience a mild rash up to 24 hours after the test. This is not dangerous. If this occurs, you can take Benadryl 25 mg and increase your fluid intake. If you experience trouble breathing, this can be serious. If it is severe call 911 IMMEDIATELY. If it is mild, please call our office.  We will call to schedule your test 2-4 weeks out understanding that some insurance companies will need an authorization prior to the service being performed.   For more information  and frequently asked questions, please visit our website : [email or web address]  For non-scheduling related questions, please contact the cardiac imaging nurse navigator should you have any questions/concerns: Cardiac Imaging Nurse Navigators Direct Office Dial: [phone]   For scheduling needs, including cancellations and rescheduling, please call [place], [phone].     Follow-Up: At [facility] you and your health needs are our priority.  As part of our continuing mission to provide you with exceptional heart care, we have created designated Provider Care Teams.  These Care Teams include your primary Cardiologist (physician) and Advanced Practice Providers (APPs -  Physician Assistants and Nurse Practitioners) who all work together to provide you with the care you need, when you need it.  We recommend signing up for the patient portal called "MyChart".  Sign up information is provided on this After Visit Summary.  MyChart is used to connect with patients for Virtual Visits (Telemedicine).  Patients are able to view lab/test results, encounter notes, upcoming appointments, etc.  Non-urgent messages can be sent to your provider as well.   To learn more about what you can do with MyChart, go to [email or web address].    Your next appointment:   1 year(s)  Provider:   Chilton Si, MD

## 2023-06-08 DIAGNOSIS — E7800 Pure hypercholesterolemia, unspecified: Secondary | ICD-10-CM | POA: Diagnosis not present

## 2023-06-08 DIAGNOSIS — E119 Type 2 diabetes mellitus without complications: Secondary | ICD-10-CM | POA: Diagnosis not present

## 2023-06-08 DIAGNOSIS — E039 Hypothyroidism, unspecified: Secondary | ICD-10-CM | POA: Diagnosis not present

## 2023-06-08 DIAGNOSIS — G4733 Obstructive sleep apnea (adult) (pediatric): Secondary | ICD-10-CM | POA: Diagnosis not present

## 2023-06-09 DIAGNOSIS — I10 Essential (primary) hypertension: Secondary | ICD-10-CM | POA: Diagnosis not present

## 2023-06-09 DIAGNOSIS — E1169 Type 2 diabetes mellitus with other specified complication: Secondary | ICD-10-CM | POA: Diagnosis not present

## 2023-06-10 DIAGNOSIS — H53431 Sector or arcuate defects, right eye: Secondary | ICD-10-CM | POA: Diagnosis not present

## 2023-06-10 DIAGNOSIS — H524 Presbyopia: Secondary | ICD-10-CM | POA: Diagnosis not present

## 2023-06-10 DIAGNOSIS — H401112 Primary open-angle glaucoma, right eye, moderate stage: Secondary | ICD-10-CM | POA: Diagnosis not present

## 2023-06-10 DIAGNOSIS — I10 Essential (primary) hypertension: Secondary | ICD-10-CM | POA: Diagnosis not present

## 2023-06-10 DIAGNOSIS — H401121 Primary open-angle glaucoma, left eye, mild stage: Secondary | ICD-10-CM | POA: Diagnosis not present

## 2023-06-11 ENCOUNTER — Encounter: Payer: Self-pay | Admitting: Gastroenterology

## 2023-06-11 ENCOUNTER — Ambulatory Visit: Payer: Medicare Other | Admitting: Gastroenterology

## 2023-06-11 VITALS — BP 138/82 | HR 92 | Ht 65.5 in | Wt 199.8 lb

## 2023-06-11 DIAGNOSIS — K648 Other hemorrhoids: Secondary | ICD-10-CM | POA: Diagnosis not present

## 2023-06-11 DIAGNOSIS — K219 Gastro-esophageal reflux disease without esophagitis: Secondary | ICD-10-CM | POA: Insufficient documentation

## 2023-06-11 NOTE — Patient Instructions (Signed)
Hydrocortisone 2.5 % cream - Cover Preparation H suppository at bedtime for 1 week then as needed.   Call around early January to scheduled hemorrhoid banding with Dr. Rhea Belton.   _______________________________________________________  If your blood pressure at your visit was 140/90 or greater, please contact your primary care physician to follow up on this.  _______________________________________________________  If you are age 72 or older, your body mass index should be between 23-30. Your Body mass index is 32.74 kg/m. If this is out of the aforementioned range listed, please consider follow up with your Primary Care Provider.  If you are age 35 or younger, your body mass index should be between 19-25. Your Body mass index is 32.74 kg/m. If this is out of the aformentioned range listed, please consider follow up with your Primary Care Provider.   ________________________________________________________  The [facility] providers would like to encourage you to use [facility] to communicate with providers for non-urgent requests or questions.  Due to long hold times on the telephone, sending your provider a message by [facility] may be a faster and more efficient way to get a response.  Please allow 48 business hours for a response.  Please remember that this is for non-urgent requests.  _______________________________________________________

## 2023-06-11 NOTE — Progress Notes (Signed)
06/11/2023 Jodi [name] [id number] 25-Aug-1950   HISTORY OF PRESENT ILLNESS:  This is a 72 year old female who is a patient of Dr. Lauro Franklin.  She has a past medical history of treated H. pylori gastritis, personal history of jejunal diverticulitis, IBS, steatotic liver, anxiety, hypertension and hypothyroidism.  She was last seen here in 06/2022.  She has discussed internal hemorrhoid banding with Dr. Rhea Belton on a few different occasions.  Was told to call and make an appt if she decided to proceed.  She says that she would like to go ahead and get that done.  She says that she has minimal bleeding.  Does use hydrocortisone cream external and sitz baths and those do help but she does not want to have to keep doing all of that forever.  No new complaints.   Past Medical History:  Diagnosis Date   Abnormal Pap smear of cervix    Anxiety    Aortic atherosclerosis (HCC)    Atypical chest pain 10/08/2020   Symptoms are very atypical.  She feels good with exercise and notes that in general she is feeling better since she started the exercise program.  She had a normal stress test in 2021.  No repeat ischemia testing advised.  She does also report difficulty and pain with swallowing with referred pain into her back.  She has GI follow-up.   Diverticulitis    Fatty liver disease, nonalcoholic    Glaucoma    H. pylori infection    H/O dizziness    Hemorrhoids    History of depression    incest survivor/dysthymia   Hypertension    Hyperthyroidism    Hypokalemia 04/29/2020   IBS (irritable bowel syndrome)    lactose intolerance   OSA (obstructive sleep apnea) 11/17/2012   Palpitations 04/29/2020   Restless leg syndrome    Sleep apnea    Snoring 11/17/2012   Thyroid disease    hypothyroid   Varicose veins    Past Surgical History:  Procedure Laterality Date   CHOLECYSTECTOMY  07/19/2017   Dr. Dwain Sarna   COLPOSCOPY  99, 00, 02, 04, 06   ENDOVENOUS ABLATION SAPHENOUS VEIN W/ LASER  Right 04-08-2015   endovenous laser ablation (right greater saphenous vein) by Betti Cruz MD     LEEP  3/09   CIN II/III    reports that she quit smoking about 44 years ago. Her smoking use included cigarettes. She has never used smokeless tobacco. She reports that she does not currently use alcohol. She reports that she does not use drugs. family history includes Breast cancer in her cousin; Colon polyps in her brother and father; Dementia in her brother, father, and mother; Diabetes in her brother, father, and paternal grandmother; Heart disease in her maternal uncle; Hyperlipidemia in her father; Hypertension in her father and mother; Parkinson's disease in her mother; Prostate cancer in her father; Seizures in her brother. Allergies  Allergen Reactions   Sulfites Anaphylaxis   Amlodipine Other (See Comments)    Abdominal pain   Iodine     Was told to place this as an allergy along with sulfites as she had almost anaphylactic reaction to shellfish years ago.   Macrodantin [Nitrofurantoin Macrocrystal] Nausea Only and Other (See Comments)    Headache   Serotonin Reuptake Inhibitors (Ssris) Other (See Comments)   Shellfish Allergy    Venlafaxine Other (See Comments)   Almond Oil Itching and Rash    ALL TREE NUTS  Bentyl [Dicyclomine Hcl] Anxiety    Patient noticed anxiety with this medication.      Outpatient Encounter Medications as of 06/11/2023  Medication Sig   ALPRAZolam (XANAX) 0.25 MG tablet Take 0.125 mg by mouth every 6 (six) hours as needed for anxiety.   Cyanocobalamin 1000 MCG TBCR Take 1 tablet by mouth daily.   estradiol (ESTRACE) 0.1 MG/GM vaginal cream 1 gram vaginally twice weekly for 4 weeks before follow up appt   fluticasone (FLONASE) 50 MCG/ACT nasal spray Place 2 sprays into both nostrils daily as needed for allergies.    lactase (LACTAID) 3000 units tablet Take 3,000 Units by mouth as needed (lactaid).    levothyroxine (SYNTHROID) 112 MCG tablet Take 112 mcg by  mouth daily.   Magnesium 200 MG TABS Take 200 mg by mouth daily.   Multiple Vitamin (MULTIVITAMIN WITH MINERALS) TABS Take 1 tablet by mouth daily.   saccharomyces boulardii (FLORASTOR) 250 MG capsule Take one tablet BID x 1 month   timolol (TIMOPTIC) 0.5 % ophthalmic solution Place 1 drop into both eyes every morning.   metoprolol tartrate (LOPRESSOR) 100 MG tablet Take 1 tablet (100 mg total) by mouth once for 1 dose. Take 90 minutes to 2 Hours Prior to Scan.   rosuvastatin (CRESTOR) 5 MG tablet Take 1 tablet (5 mg total) by mouth 3 (three) times a week.   No facility-administered encounter medications on file as of 06/11/2023.    REVIEW OF SYSTEMS  : All other systems reviewed and negative except where noted in the History of Present Illness.   PHYSICAL EXAM: Ht 5' 5.5" (1.664 m)   Wt 199 lb 12.8 oz (90.6 kg)   LMP 07/06/2010   BMI 32.74 kg/m  General: Well developed white female in no acute distress Head: Normocephalic and atraumatic Eyes:  Sclerae anicteric, conjunctiva pink. Ears: Normal auditory acuity Musculoskeletal: Symmetrical with no gross deformities  Neurological: Alert oriented x 4, grossly non-focal Psychological:  Alert and cooperative. Normal mood and affect  ASSESSMENT AND PLAN: *Internal hemorrhoids:  Discussed with Dr. Rhea Belton previously about banding.  She would like proceed.  She will call back in a few weeks to get a hemorrhoid banding appt with Dr. Rhea Belton in March.  In the interim can use hydrocortisone cream on a Preparation H suppository at bedtime intermittently prn and continue sitz baths, etc.  CC:  [name], MD

## 2023-06-14 DIAGNOSIS — M25539 Pain in unspecified wrist: Secondary | ICD-10-CM | POA: Diagnosis not present

## 2023-06-14 DIAGNOSIS — M25569 Pain in unspecified knee: Secondary | ICD-10-CM | POA: Diagnosis not present

## 2023-06-14 DIAGNOSIS — M25519 Pain in unspecified shoulder: Secondary | ICD-10-CM | POA: Diagnosis not present

## 2023-06-15 ENCOUNTER — Encounter: Payer: Self-pay | Admitting: Family Medicine

## 2023-06-15 ENCOUNTER — Other Ambulatory Visit: Payer: Self-pay

## 2023-06-15 ENCOUNTER — Ambulatory Visit: Payer: Medicare Other | Admitting: Family Medicine

## 2023-06-15 VITALS — BP 154/82 | HR 93 | Ht 65.5 in | Wt 201.0 lb

## 2023-06-15 DIAGNOSIS — M25562 Pain in left knee: Secondary | ICD-10-CM | POA: Diagnosis not present

## 2023-06-15 DIAGNOSIS — G8929 Other chronic pain: Secondary | ICD-10-CM | POA: Diagnosis not present

## 2023-06-15 NOTE — Progress Notes (Signed)
   I, Stevenson Clinch, CMA acting as a scribe for Clementeen Graham, MD.  Jodi [name] is a 72 y.o. female who presents to [facility] at [facility] today for cont'd L knee pain. Pt was last seen by Dr. Denyse Amass on 05/24/23 and was given a L knee steroid injection.  Today, pt reports improvement of knee sx s/ injection and with PT. Swelling has improved. Notes occasional catch in the knee with ambulation.   Dx testing: 04/29/23 L knee XR  09/18/22 L LE vasc US  Pertinent review of systems: No fevers or chills  Relevant historical information: Hypertension   Exam:  BP (!) 154/82   Pulse 93   Ht 5' 5.5" (1.664 m)   Wt 201 lb (91.2 kg)   LMP 07/06/2010   SpO2 97%   BMI 32.94 kg/m  General: Well Developed, well nourished, and in no acute distress.   MSK: Left knee normal-appearing Mildly tender palpation medial joint line.  Normal motion with crepitation.  Stable ligamentous exam.    Lab and Radiology Results  Diagnostic Limited MSK Ultrasound of: Left medial knee Degeneration medial meniscus visible.   Impression: Medial meniscus degenerative changes.   EXAM: LEFT KNEE 3 VIEWS   COMPARISON:  None Available.   FINDINGS: No acute fracture or dislocation is noted. Mild patellofemoral degenerative changes are seen. No soft tissue abnormality is noted.   IMPRESSION: Mild degenerative change without acute abnormality.     Electronically Signed   By: Alcide Clever M.D.   On: 05/23/2023 03:53 I, Clementeen Graham, personally (independently) visualized and performed the interpretation of the images attached in this note.    Assessment and Plan: 72 y.o. female with left knee pain.  Pain is significantly improving following intra-articular steroid injection mid-November and physical therapy.  She has a little residual pain at the medial joint line.  Differential includes medial compartment arthritis worse than seen on x-ray or degenerative medial meniscus tear or both.   Plan to continue out physical therapy and home exercise program.  Can repeat steroid injection at the 27-month mark which would be mid February if needed.   PDMP not reviewed this encounter. Orders Placed This Encounter  Procedures   [place] LIMITED JOINT SPACE STRUCTURES LOW LEFT(NO LINKED CHARGES)    Order Specific Question:   Reason for Exam (SYMPTOM  OR DIAGNOSIS REQUIRED)    Answer:   left knee pain    Order Specific Question:   Preferred imaging location?    Answer:   [facility]   No orders of the defined types were placed in this encounter.    Discussed warning signs or symptoms. Please see discharge instructions. Patient expresses understanding.   The above documentation has been reviewed and is accurate and complete Clementeen Graham, M.D.

## 2023-06-15 NOTE — Patient Instructions (Addendum)
Thank you for coming in today.   Continue home exercises.   If that area gets worse we can do a MRI to look more closely.   We could repeat injection mid February if needed.

## 2023-06-21 DIAGNOSIS — M25569 Pain in unspecified knee: Secondary | ICD-10-CM | POA: Diagnosis not present

## 2023-06-21 DIAGNOSIS — M25539 Pain in unspecified wrist: Secondary | ICD-10-CM | POA: Diagnosis not present

## 2023-06-21 DIAGNOSIS — M25519 Pain in unspecified shoulder: Secondary | ICD-10-CM | POA: Diagnosis not present

## 2023-06-22 DIAGNOSIS — D229 Melanocytic nevi, unspecified: Secondary | ICD-10-CM | POA: Diagnosis not present

## 2023-06-22 DIAGNOSIS — Z808 Family history of malignant neoplasm of other organs or systems: Secondary | ICD-10-CM | POA: Diagnosis not present

## 2023-06-22 DIAGNOSIS — L814 Other melanin hyperpigmentation: Secondary | ICD-10-CM | POA: Diagnosis not present

## 2023-06-22 DIAGNOSIS — D485 Neoplasm of uncertain behavior of skin: Secondary | ICD-10-CM | POA: Diagnosis not present

## 2023-06-22 DIAGNOSIS — L570 Actinic keratosis: Secondary | ICD-10-CM | POA: Diagnosis not present

## 2023-06-22 DIAGNOSIS — B079 Viral wart, unspecified: Secondary | ICD-10-CM | POA: Diagnosis not present

## 2023-06-22 DIAGNOSIS — L578 Other skin changes due to chronic exposure to nonionizing radiation: Secondary | ICD-10-CM | POA: Diagnosis not present

## 2023-06-22 DIAGNOSIS — L720 Epidermal cyst: Secondary | ICD-10-CM | POA: Diagnosis not present

## 2023-06-22 DIAGNOSIS — L820 Inflamed seborrheic keratosis: Secondary | ICD-10-CM | POA: Diagnosis not present

## 2023-06-22 DIAGNOSIS — L821 Other seborrheic keratosis: Secondary | ICD-10-CM | POA: Diagnosis not present

## 2023-06-25 NOTE — Progress Notes (Signed)
Addendum: Reviewed and agree with assessment and management plan. Kadijah Shamoon M, MD  

## 2023-07-06 ENCOUNTER — Ambulatory Visit (HOSPITAL_COMMUNITY): Admission: RE | Admit: 2023-07-06 | Payer: Medicare Other | Source: Ambulatory Visit

## 2023-07-08 DIAGNOSIS — M25539 Pain in unspecified wrist: Secondary | ICD-10-CM | POA: Diagnosis not present

## 2023-07-08 DIAGNOSIS — M25569 Pain in unspecified knee: Secondary | ICD-10-CM | POA: Diagnosis not present

## 2023-07-08 DIAGNOSIS — M25519 Pain in unspecified shoulder: Secondary | ICD-10-CM | POA: Diagnosis not present

## 2023-07-14 ENCOUNTER — Other Ambulatory Visit (HOSPITAL_BASED_OUTPATIENT_CLINIC_OR_DEPARTMENT_OTHER): Payer: Self-pay | Admitting: *Deleted

## 2023-07-14 ENCOUNTER — Encounter (HOSPITAL_BASED_OUTPATIENT_CLINIC_OR_DEPARTMENT_OTHER): Payer: Self-pay | Admitting: Obstetrics & Gynecology

## 2023-07-14 ENCOUNTER — Telehealth: Payer: Self-pay | Admitting: Gastroenterology

## 2023-07-14 DIAGNOSIS — M25519 Pain in unspecified shoulder: Secondary | ICD-10-CM | POA: Diagnosis not present

## 2023-07-14 DIAGNOSIS — M25539 Pain in unspecified wrist: Secondary | ICD-10-CM | POA: Diagnosis not present

## 2023-07-14 DIAGNOSIS — M25569 Pain in unspecified knee: Secondary | ICD-10-CM | POA: Diagnosis not present

## 2023-07-14 MED ORDER — ESTRADIOL 0.1 MG/GM VA CREA: TOPICAL_CREAM | VAGINAL | 0 refills | Status: DC

## 2023-07-14 NOTE — Telephone Encounter (Signed)
  The pt stated that she would like an appt with Dr Rhea Belton to discuss hemorrhoid banding.  She is currently driving and will call back to make the appt. She is not sure why a message was sent to the nurse.  She just needed to make the appt.

## 2023-07-14 NOTE — Telephone Encounter (Signed)
 The pt has been set up for 09/23/23 at 4 pm with Dr Rhea Belton to discuss banding

## 2023-07-14 NOTE — Telephone Encounter (Signed)
 Inbound call from patient requesting a call to discuss hemorrhoid banding further. States she would like to go over banding in more detail prior to scheduling. Please advise, thank you.

## 2023-07-14 NOTE — Telephone Encounter (Signed)
 The patient has been notified of this information and all questions answered.

## 2023-07-14 NOTE — Telephone Encounter (Signed)
 Patient calling to schedule f/u with provider. No additional appts are available. Requesting to speak with a nurse. Please advise.

## 2023-07-19 DIAGNOSIS — M25539 Pain in unspecified wrist: Secondary | ICD-10-CM | POA: Diagnosis not present

## 2023-07-19 DIAGNOSIS — M25519 Pain in unspecified shoulder: Secondary | ICD-10-CM | POA: Diagnosis not present

## 2023-07-19 DIAGNOSIS — M25569 Pain in unspecified knee: Secondary | ICD-10-CM | POA: Diagnosis not present

## 2023-07-20 DIAGNOSIS — L570 Actinic keratosis: Secondary | ICD-10-CM | POA: Diagnosis not present

## 2023-07-28 DIAGNOSIS — M25569 Pain in unspecified knee: Secondary | ICD-10-CM | POA: Diagnosis not present

## 2023-07-28 DIAGNOSIS — M25539 Pain in unspecified wrist: Secondary | ICD-10-CM | POA: Diagnosis not present

## 2023-07-28 DIAGNOSIS — M25519 Pain in unspecified shoulder: Secondary | ICD-10-CM | POA: Diagnosis not present

## 2023-08-03 DIAGNOSIS — I872 Venous insufficiency (chronic) (peripheral): Secondary | ICD-10-CM | POA: Diagnosis not present

## 2023-08-04 DIAGNOSIS — M25569 Pain in unspecified knee: Secondary | ICD-10-CM | POA: Diagnosis not present

## 2023-08-04 DIAGNOSIS — M25539 Pain in unspecified wrist: Secondary | ICD-10-CM | POA: Diagnosis not present

## 2023-08-04 DIAGNOSIS — M25519 Pain in unspecified shoulder: Secondary | ICD-10-CM | POA: Diagnosis not present

## 2023-08-09 DIAGNOSIS — E1169 Type 2 diabetes mellitus with other specified complication: Secondary | ICD-10-CM | POA: Diagnosis not present

## 2023-08-09 DIAGNOSIS — I10 Essential (primary) hypertension: Secondary | ICD-10-CM | POA: Diagnosis not present

## 2023-08-16 DIAGNOSIS — M25539 Pain in unspecified wrist: Secondary | ICD-10-CM | POA: Diagnosis not present

## 2023-08-16 DIAGNOSIS — M25519 Pain in unspecified shoulder: Secondary | ICD-10-CM | POA: Diagnosis not present

## 2023-08-16 DIAGNOSIS — M25569 Pain in unspecified knee: Secondary | ICD-10-CM | POA: Diagnosis not present

## 2023-08-20 ENCOUNTER — Telehealth (HOSPITAL_BASED_OUTPATIENT_CLINIC_OR_DEPARTMENT_OTHER): Payer: Self-pay | Admitting: Cardiovascular Disease

## 2023-08-20 NOTE — Telephone Encounter (Signed)
Patient stated she has orders for a CT CORONARY MORPH W/CTA COR W/SCORE W/CA W/CM test.  Patient wants Dr. Leonides Sake advice regarding this test.

## 2023-08-23 NOTE — Telephone Encounter (Signed)
 August 22, 2023 Chilton Si, MD to Marlene Lard, RN     08/22/23 10:23 PM This is a very safe test that will verify how much plaque is in her heart arteries.  Tiffany C. Duke Salvia, MD, [facility]  Left detailed message, ok per [facility]

## 2023-09-13 DIAGNOSIS — M25569 Pain in unspecified knee: Secondary | ICD-10-CM | POA: Diagnosis not present

## 2023-09-13 DIAGNOSIS — M25539 Pain in unspecified wrist: Secondary | ICD-10-CM | POA: Diagnosis not present

## 2023-09-13 DIAGNOSIS — M25519 Pain in unspecified shoulder: Secondary | ICD-10-CM | POA: Diagnosis not present

## 2023-09-14 DIAGNOSIS — I10 Essential (primary) hypertension: Secondary | ICD-10-CM | POA: Diagnosis not present

## 2023-09-14 DIAGNOSIS — E1169 Type 2 diabetes mellitus with other specified complication: Secondary | ICD-10-CM | POA: Diagnosis not present

## 2023-09-22 ENCOUNTER — Ambulatory Visit (INDEPENDENT_AMBULATORY_CARE_PROVIDER_SITE_OTHER)

## 2023-09-22 ENCOUNTER — Other Ambulatory Visit: Payer: Self-pay

## 2023-09-22 ENCOUNTER — Encounter: Payer: Self-pay | Admitting: Family Medicine

## 2023-09-22 ENCOUNTER — Ambulatory Visit: Admitting: Family Medicine

## 2023-09-22 VITALS — BP 134/82 | HR 96 | Ht 65.5 in

## 2023-09-22 DIAGNOSIS — M1712 Unilateral primary osteoarthritis, left knee: Secondary | ICD-10-CM | POA: Diagnosis not present

## 2023-09-22 DIAGNOSIS — M25562 Pain in left knee: Secondary | ICD-10-CM | POA: Diagnosis not present

## 2023-09-22 DIAGNOSIS — M25561 Pain in right knee: Secondary | ICD-10-CM

## 2023-09-22 DIAGNOSIS — M19049 Primary osteoarthritis, unspecified hand: Secondary | ICD-10-CM | POA: Diagnosis not present

## 2023-09-22 DIAGNOSIS — G8929 Other chronic pain: Secondary | ICD-10-CM

## 2023-09-22 DIAGNOSIS — M79642 Pain in left hand: Secondary | ICD-10-CM

## 2023-09-22 DIAGNOSIS — M79641 Pain in right hand: Secondary | ICD-10-CM | POA: Diagnosis not present

## 2023-09-22 NOTE — Patient Instructions (Addendum)
 Thank you for coming in today.   Do not use NSAIDS such as Advil or Aleve when taking Meloxicam It is ok to use Tylenol for additional pain relief   Please get an Xray today before you leave   Talk to you personal trainer about modifying exercises  You received an injection today. Seek immediate medical attention if the joint becomes red, extremely painful, or is oozing fluid.  I can inject your hands anytime, just let me know

## 2023-09-22 NOTE — Progress Notes (Signed)
 I, Stevenson Clinch, CMA acting as a scribe for Clementeen Graham, MD.  Jodi [name] is a 73 y.o. female who presents to [facility] at [facility] today for cont'd L knee pain. Pt was last seen by Dr. Denyse Amass on 06/15/23 and was advised to finish out PT and cont HEP. Last L knee steroid injection, 05/24/23.  Today, pt reports flare up of since end of Feb 2025 while traveling to [place]. Notes increased pain and swelling. Locates pain to medial aspect of the knee. Pain and swelling into the lower leg. Denies injury while traveling. Using compression and ice. Notes that sx appears less swollen in the mornings and worsens throughout the day with activity. Has been attempting to walk 10-15 mins daily.  . Recently got back from [place].  She had a great trip but unfortunately was unable to see any of [place] because of cloudy weather.  Additionally she notes some chronic hand pain bilaterally.  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  Her hands turn to bother her a little more as she is doing some weightlifting now with a Systems analyst.  Also she has some mild right knee pain located at the medial aspect of the joint line. Dx testing: 04/29/23 L knee XR  09/18/22 L LE vasc US  Pertinent review of systems: No fevers or chills  Relevant historical information: Hypertension and arthritis bilaterally.   Exam:  BP 134/82   Pulse 96   Ht 5' 5.5" (1.664 m)   LMP 07/06/2010   SpO2 96%   BMI 32.94 kg/m  General: Well Developed, well nourished, and in no acute distress.   MSK: Hands bilaterally bossing at first [facility] mildly tender to palpation normal thumb motion.  Left knee mild joint effusion.  Normal motion with crepitation tender palpation medial joint line. Palpable Baker's cyst posterior knee. Stable ligamentous exam.  Right knee normal motion mildly tender palpation medial joint  line.    Lab and Radiology Results  Procedure: Real-time Ultrasound Guided Injection of left knee joint superior lateral patella space Device: Philips Affiniti 50G/GE Logiq Images permanently stored and available for review in PACS Ultrasound evaluation prior to injection reveals medial degeneration mild joint effusion and a moderate Baker's cyst. Verbal informed consent obtained.  Discussed risks and benefits of procedure. Warned about infection, bleeding, hyperglycemia damage to structures among others. Patient expresses understanding and agreement Time-out conducted.   Noted no overlying erythema, induration, or other signs of local infection.   Skin prepped in a sterile fashion.   Local anesthesia: Topical Ethyl chloride.   With sterile technique and under real time ultrasound guidance: 40 mg of Kenalog and 2 mL of Marcaine injected into knee joint. Fluid seen entering the joint capsule.   Completed without difficulty   Pain immediately resolved suggesting accurate placement of the medication.   Advised to call if fevers/chills, erythema, induration, drainage, or persistent bleeding.   Images permanently stored and available for review in the ultrasound unit.  Impression: Technically successful ultrasound guided injection.   X-ray images right knee obtained today personally and independently interpreted. Minimal DJD.  No acute fractures. Await formal radiology review        Assessment and Plan: 73 y.o. 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  Plan for intra-articular steroid injection.  Could proceed with aspiration of the  Baker's cyst as well in the future if needed.  Bilateral hand pain.  This is again an acute exacerbation of a chronic problem due to first [facility] DJD.  Plan to resume hand exercises taught previously by hand therapy and use Voltaren gel.  If not sufficient could proceed to injection CMC  bilaterally if needed.  Mild right knee pain due to exacerbation of mild DJD.  Consider injection in the future if needed.   PDMP not reviewed this encounter. Orders Placed This Encounter  Procedures   [place] LIMITED JOINT SPACE STRUCTURES LOW LEFT(NO LINKED CHARGES)    Reason for Exam (SYMPTOM  OR DIAGNOSIS REQUIRED):   left knee pain    Preferred imaging location?:   [facility] Knee AP/LAT W/[place] Right    Standing Status:   Future    Number of Occurrences:   1    Expiration Date:   10/23/2023    Reason for Exam (SYMPTOM  OR DIAGNOSIS REQUIRED):   right knee pain    Preferred imaging location?:   [place]   No orders of the defined types were placed in this encounter.    Discussed warning signs or symptoms. Please see discharge instructions. Patient expresses understanding.   The above documentation has been reviewed and is accurate and complete Clementeen Graham, M.D.

## 2023-09-23 ENCOUNTER — Telehealth: Payer: Self-pay | Admitting: Internal Medicine

## 2023-09-23 ENCOUNTER — Ambulatory Visit: Payer: Medicare Other | Admitting: Internal Medicine

## 2023-09-23 NOTE — Telephone Encounter (Signed)
 Offered patient an appt with a PA and she declined.  She stated "last time I saw the PA she told me that I needed to f/u with Dr. Rhea Belton."  I scheduled her next available with Dr. Rhea Belton for 12/16/23 @ 3pm and placed her on the wait list.  Patient notified.

## 2023-09-23 NOTE — Telephone Encounter (Signed)
 Can schedule her with a PA if she wants to be seen sooner than his 1st available. He had a family emergency and does not have anything prior to 6/3.

## 2023-09-23 NOTE — Telephone Encounter (Signed)
 Patient was scheduled for today with Dr. Rhea Belton and appt was bumped.  Spoke with patient and she said she had to reschedule appt in 04/2023 due to Dr. Rhea Belton being out of office.  Requesting to be seen soon.  Please advise scheduling?

## 2023-09-27 DIAGNOSIS — M25519 Pain in unspecified shoulder: Secondary | ICD-10-CM | POA: Diagnosis not present

## 2023-09-27 DIAGNOSIS — M25569 Pain in unspecified knee: Secondary | ICD-10-CM | POA: Diagnosis not present

## 2023-09-27 DIAGNOSIS — M25539 Pain in unspecified wrist: Secondary | ICD-10-CM | POA: Diagnosis not present

## 2023-09-28 DIAGNOSIS — I10 Essential (primary) hypertension: Secondary | ICD-10-CM | POA: Diagnosis not present

## 2023-09-28 DIAGNOSIS — E1169 Type 2 diabetes mellitus with other specified complication: Secondary | ICD-10-CM | POA: Diagnosis not present

## 2023-09-30 DIAGNOSIS — G4733 Obstructive sleep apnea (adult) (pediatric): Secondary | ICD-10-CM | POA: Diagnosis not present

## 2023-09-30 DIAGNOSIS — G2581 Restless legs syndrome: Secondary | ICD-10-CM | POA: Diagnosis not present

## 2023-10-06 ENCOUNTER — Encounter: Payer: Self-pay | Admitting: Family Medicine

## 2023-10-06 NOTE — Progress Notes (Signed)
Right knee x-ray looks normal to radiology

## 2023-10-08 DIAGNOSIS — M25519 Pain in unspecified shoulder: Secondary | ICD-10-CM | POA: Diagnosis not present

## 2023-10-08 DIAGNOSIS — M25569 Pain in unspecified knee: Secondary | ICD-10-CM | POA: Diagnosis not present

## 2023-10-08 DIAGNOSIS — M25539 Pain in unspecified wrist: Secondary | ICD-10-CM | POA: Diagnosis not present

## 2023-10-11 ENCOUNTER — Telehealth: Payer: Self-pay | Admitting: Family Medicine

## 2023-10-11 DIAGNOSIS — M25539 Pain in unspecified wrist: Secondary | ICD-10-CM | POA: Diagnosis not present

## 2023-10-11 DIAGNOSIS — M25569 Pain in unspecified knee: Secondary | ICD-10-CM | POA: Diagnosis not present

## 2023-10-11 DIAGNOSIS — M25519 Pain in unspecified shoulder: Secondary | ICD-10-CM | POA: Diagnosis not present

## 2023-10-11 NOTE — Telephone Encounter (Signed)
 Patient has been going to physical therapy and they requested that Dr. Denyse Amass send the last visit notes that she had an injection in her left knee. She says PT states that it may be helpful to get more visits approved for physical therapy. She said insurance is approving less and less visits. She says she would like a little more PT.  Please Advise.

## 2023-10-12 ENCOUNTER — Other Ambulatory Visit: Payer: Self-pay

## 2023-10-12 DIAGNOSIS — G8929 Other chronic pain: Secondary | ICD-10-CM

## 2023-10-12 NOTE — Telephone Encounter (Signed)
 Spoke to pt to clarify. She is out of PT visits and would like to do more. The PT facility [facility]), now requires physical referrals for their pt. New pt referral placed. She verbalized understanding.

## 2023-10-18 DIAGNOSIS — Z1231 Encounter for screening mammogram for malignant neoplasm of breast: Secondary | ICD-10-CM | POA: Diagnosis not present

## 2023-10-19 DIAGNOSIS — M25569 Pain in unspecified knee: Secondary | ICD-10-CM | POA: Diagnosis not present

## 2023-10-19 DIAGNOSIS — M25539 Pain in unspecified wrist: Secondary | ICD-10-CM | POA: Diagnosis not present

## 2023-10-19 DIAGNOSIS — M25519 Pain in unspecified shoulder: Secondary | ICD-10-CM | POA: Diagnosis not present

## 2023-10-22 DIAGNOSIS — R92321 Mammographic fibroglandular density, right breast: Secondary | ICD-10-CM | POA: Diagnosis not present

## 2023-10-22 DIAGNOSIS — R922 Inconclusive mammogram: Secondary | ICD-10-CM | POA: Diagnosis not present

## 2023-10-25 DIAGNOSIS — G4733 Obstructive sleep apnea (adult) (pediatric): Secondary | ICD-10-CM | POA: Diagnosis not present

## 2023-10-25 DIAGNOSIS — E1169 Type 2 diabetes mellitus with other specified complication: Secondary | ICD-10-CM | POA: Diagnosis not present

## 2023-10-25 DIAGNOSIS — I10 Essential (primary) hypertension: Secondary | ICD-10-CM | POA: Diagnosis not present

## 2023-10-26 DIAGNOSIS — M25519 Pain in unspecified shoulder: Secondary | ICD-10-CM | POA: Diagnosis not present

## 2023-10-26 DIAGNOSIS — M25569 Pain in unspecified knee: Secondary | ICD-10-CM | POA: Diagnosis not present

## 2023-10-29 ENCOUNTER — Other Ambulatory Visit: Payer: Self-pay

## 2023-10-29 ENCOUNTER — Encounter (HOSPITAL_BASED_OUTPATIENT_CLINIC_OR_DEPARTMENT_OTHER): Payer: Self-pay | Admitting: *Deleted

## 2023-10-29 DIAGNOSIS — N6021 Fibroadenosis of right breast: Secondary | ICD-10-CM | POA: Diagnosis not present

## 2023-10-29 DIAGNOSIS — N6313 Unspecified lump in the right breast, lower outer quadrant: Secondary | ICD-10-CM | POA: Diagnosis not present

## 2023-10-29 DIAGNOSIS — N6489 Other specified disorders of breast: Secondary | ICD-10-CM | POA: Diagnosis not present

## 2023-11-01 LAB — SURGICAL PATHOLOGY

## 2023-11-10 ENCOUNTER — Encounter (HOSPITAL_BASED_OUTPATIENT_CLINIC_OR_DEPARTMENT_OTHER): Payer: Self-pay | Admitting: *Deleted

## 2023-11-15 ENCOUNTER — Other Ambulatory Visit (HOSPITAL_BASED_OUTPATIENT_CLINIC_OR_DEPARTMENT_OTHER): Payer: Self-pay | Admitting: Obstetrics & Gynecology

## 2023-11-15 DIAGNOSIS — E1169 Type 2 diabetes mellitus with other specified complication: Secondary | ICD-10-CM | POA: Diagnosis not present

## 2023-11-15 DIAGNOSIS — G4733 Obstructive sleep apnea (adult) (pediatric): Secondary | ICD-10-CM | POA: Diagnosis not present

## 2023-11-15 DIAGNOSIS — I10 Essential (primary) hypertension: Secondary | ICD-10-CM | POA: Diagnosis not present

## 2023-11-15 DIAGNOSIS — M25569 Pain in unspecified knee: Secondary | ICD-10-CM | POA: Diagnosis not present

## 2023-11-15 DIAGNOSIS — M25519 Pain in unspecified shoulder: Secondary | ICD-10-CM | POA: Diagnosis not present

## 2023-11-15 DIAGNOSIS — M25539 Pain in unspecified wrist: Secondary | ICD-10-CM | POA: Diagnosis not present

## 2023-11-18 DIAGNOSIS — M25569 Pain in unspecified knee: Secondary | ICD-10-CM | POA: Diagnosis not present

## 2023-11-18 DIAGNOSIS — M25539 Pain in unspecified wrist: Secondary | ICD-10-CM | POA: Diagnosis not present

## 2023-11-18 DIAGNOSIS — M25519 Pain in unspecified shoulder: Secondary | ICD-10-CM | POA: Diagnosis not present

## 2023-11-22 ENCOUNTER — Other Ambulatory Visit (HOSPITAL_COMMUNITY)
Admission: RE | Admit: 2023-11-22 | Discharge: 2023-11-22 | Disposition: A | Discharge status: Home / Self Care | Source: Ambulatory Visit | Attending: Obstetrics & Gynecology | Admitting: Obstetrics & Gynecology

## 2023-11-22 ENCOUNTER — Ambulatory Visit (HOSPITAL_BASED_OUTPATIENT_CLINIC_OR_DEPARTMENT_OTHER): Payer: Medicare Other | Admitting: Obstetrics & Gynecology

## 2023-11-22 VITALS — BP 134/76 | HR 88 | Ht 65.5 in | Wt 205.0 lb

## 2023-11-22 DIAGNOSIS — Z1151 Encounter for screening for human papillomavirus (HPV): Secondary | ICD-10-CM | POA: Insufficient documentation

## 2023-11-22 DIAGNOSIS — D069 Carcinoma in situ of cervix, unspecified: Secondary | ICD-10-CM | POA: Insufficient documentation

## 2023-11-22 DIAGNOSIS — Z9889 Other specified postprocedural states: Secondary | ICD-10-CM | POA: Diagnosis not present

## 2023-11-22 DIAGNOSIS — Z01411 Encounter for gynecological examination (general) (routine) with abnormal findings: Secondary | ICD-10-CM | POA: Insufficient documentation

## 2023-11-22 NOTE — Progress Notes (Signed)
 GYNECOLOGY  VISIT  CC:   f/u pap  HPI: 73 y.o. G1P1 Married White or Caucasian female here for f/u pap after treating with vaginal estrogen.  She does have hx of cervical dysplasia and is s/p LEEP performed 09/2007 showing CIN 2 and 3.  Pap 05/24/2023 showed ASCUS with neg HR HPV.  This was the third ASCUS pap or atrophic pattern with epithelial atypia pap.  Vaginal estrogen was started and she has used this for several months.  Actually is pleased with vaginal tissue improvement.  May want to continue.  Denies vaginal bleeding.     Past Medical History:  Diagnosis Date   Abnormal Pap smear of cervix    Anxiety    Aortic atherosclerosis (HCC)    Atypical chest pain 10/08/2020   Symptoms are very atypical.  She feels good with exercise and notes that in general she is feeling better since she started the exercise program.  She had a normal stress test in 2021.  No repeat ischemia testing advised.  She does also report difficulty and pain with swallowing with referred pain into her back.  She has GI follow-up.   Diverticulitis    Fatty liver disease, nonalcoholic    Glaucoma    H. pylori infection    H/O dizziness    Hemorrhoids    History of depression    incest survivor/dysthymia   Hypertension    Hyperthyroidism    Hypokalemia 04/29/2020   IBS (irritable bowel syndrome)    lactose intolerance   OSA (obstructive sleep apnea) 11/17/2012   Palpitations 04/29/2020   Restless leg syndrome    Sleep apnea    Snoring 11/17/2012   Thyroid  disease    hypothyroid   Varicose veins     MEDS:   Current Outpatient Medications on File Prior to Visit  Medication Sig Dispense Refill   ALPRAZolam  (XANAX ) 0.25 MG tablet Take 0.125 mg by mouth every 6 (six) hours as needed for anxiety.     Cyanocobalamin 1000 MCG TBCR Take 1 tablet by mouth daily.     estradiol  (ESTRACE ) 0.1 MG/GM vaginal cream INESRT 1 GRAM VAGINALLY TWICE WEEKLY FOR 4 WEEKS BEFORE FOLLOW UP APPT 42.5 g 5   fluticasone (FLONASE)  50 MCG/ACT nasal spray Place 2 sprays into both nostrils daily as needed for allergies.      lactase (LACTAID) 3000 units tablet Take 3,000 Units by mouth as needed (lactaid).      levothyroxine (SYNTHROID) 112 MCG tablet Take 112 mcg by mouth daily.     Magnesium  200 MG TABS Take 200 mg by mouth daily.     MOUNJARO 2.5 MG/0.5ML Pen SMARTSIG:2.5 Milligram(s) SUB-Q Once a Week     Multiple Vitamin (MULTIVITAMIN WITH MINERALS) TABS Take 1 tablet by mouth daily.     saccharomyces boulardii (FLORASTOR) 250 MG capsule Take one tablet BID x 1 month 60 capsule 0   timolol (TIMOPTIC) 0.5 % ophthalmic solution Place 1 drop into both eyes every morning.  3   metoprolol  tartrate (LOPRESSOR ) 100 MG tablet Take 1 tablet (100 mg total) by mouth once for 1 dose. Take 90 minutes to 2 Hours Prior to Scan. 1 tablet 0   rosuvastatin  (CRESTOR ) 5 MG tablet Take 1 tablet (5 mg total) by mouth 3 (three) times a week. 45 tablet 3   No current facility-administered medications on file prior to visit.    ALLERGIES: Sulfites, Amlodipine , Iodine, Macrodantin [nitrofurantoin macrocrystal], Serotonin reuptake inhibitors (ssris), Shellfish allergy, Venlafaxine, Almond oil, and  Bentyl  [dicyclomine  hcl]  SH:  married, no smoker  Review of Systems  Constitutional: Negative.   Genitourinary: Negative.     PHYSICAL EXAMINATION:    BP 134/76 (BP Location: Left Arm, Patient Position: Sitting)   Pulse 88   Ht 5' 5.5" (1.664 m)   Wt 205 lb (93 kg)   LMP 07/06/2010   BMI 33.59 kg/m     General appearance: alert, cooperative and appears stated age  Lymph:  no inguinal LAD noted Pelvic: External genitalia:  no lesions              Urethra:  normal appearing urethra with no masses, tenderness or lesions              Bartholins and Skenes: normal                 Vagina: normal mucosa without prolapse or lesions              Cervix: no lesions           Chaperone, Myrtie Atkinson, CMA, was present for  exam.  Assessment/Plan: 1. H/O LEEP (Primary) - Cytology - PAP( [place])  2. CIN III (cervical intraepithelial neoplasia grade III) with severe dysplasia - Cytology - PAP( [place])

## 2023-11-23 DIAGNOSIS — M25569 Pain in unspecified knee: Secondary | ICD-10-CM | POA: Diagnosis not present

## 2023-11-23 DIAGNOSIS — M25539 Pain in unspecified wrist: Secondary | ICD-10-CM | POA: Diagnosis not present

## 2023-11-23 DIAGNOSIS — M25519 Pain in unspecified shoulder: Secondary | ICD-10-CM | POA: Diagnosis not present

## 2023-11-24 ENCOUNTER — Ambulatory Visit (HOSPITAL_BASED_OUTPATIENT_CLINIC_OR_DEPARTMENT_OTHER): Payer: Self-pay | Admitting: Obstetrics & Gynecology

## 2023-11-24 LAB — CYTOLOGY - PAP
Comment: NEGATIVE
Diagnosis: NEGATIVE
Diagnosis: REACTIVE
High risk HPV: NEGATIVE

## 2023-11-25 ENCOUNTER — Encounter (HOSPITAL_BASED_OUTPATIENT_CLINIC_OR_DEPARTMENT_OTHER): Payer: Self-pay | Admitting: Obstetrics & Gynecology

## 2023-12-08 DIAGNOSIS — M25539 Pain in unspecified wrist: Secondary | ICD-10-CM | POA: Diagnosis not present

## 2023-12-08 DIAGNOSIS — M25569 Pain in unspecified knee: Secondary | ICD-10-CM | POA: Diagnosis not present

## 2023-12-08 DIAGNOSIS — M25519 Pain in unspecified shoulder: Secondary | ICD-10-CM | POA: Diagnosis not present

## 2023-12-14 DIAGNOSIS — G4733 Obstructive sleep apnea (adult) (pediatric): Secondary | ICD-10-CM | POA: Diagnosis not present

## 2023-12-14 DIAGNOSIS — E1169 Type 2 diabetes mellitus with other specified complication: Secondary | ICD-10-CM | POA: Diagnosis not present

## 2023-12-14 DIAGNOSIS — I10 Essential (primary) hypertension: Secondary | ICD-10-CM | POA: Diagnosis not present

## 2023-12-15 DIAGNOSIS — M25539 Pain in unspecified wrist: Secondary | ICD-10-CM | POA: Diagnosis not present

## 2023-12-15 DIAGNOSIS — M25519 Pain in unspecified shoulder: Secondary | ICD-10-CM | POA: Diagnosis not present

## 2023-12-15 DIAGNOSIS — M25569 Pain in unspecified knee: Secondary | ICD-10-CM | POA: Diagnosis not present

## 2023-12-16 ENCOUNTER — Encounter: Payer: Self-pay | Admitting: Internal Medicine

## 2023-12-16 ENCOUNTER — Ambulatory Visit (INDEPENDENT_AMBULATORY_CARE_PROVIDER_SITE_OTHER): Admitting: Internal Medicine

## 2023-12-16 VITALS — BP 120/70 | HR 89 | Ht 65.0 in | Wt 205.0 lb

## 2023-12-16 DIAGNOSIS — L290 Pruritus ani: Secondary | ICD-10-CM | POA: Diagnosis not present

## 2023-12-16 DIAGNOSIS — K602 Anal fissure, unspecified: Secondary | ICD-10-CM | POA: Diagnosis not present

## 2023-12-16 DIAGNOSIS — K641 Second degree hemorrhoids: Secondary | ICD-10-CM

## 2023-12-16 DIAGNOSIS — K648 Other hemorrhoids: Secondary | ICD-10-CM

## 2023-12-16 DIAGNOSIS — Z1211 Encounter for screening for malignant neoplasm of colon: Secondary | ICD-10-CM

## 2023-12-16 MED ORDER — AMBULATORY NON FORMULARY MEDICATION: 0 refills | Status: DC

## 2023-12-16 NOTE — Patient Instructions (Addendum)
 We have sent a prescription for Diltiazem/ Lidocaine  gel to [facility]. Apply a pea size amount 1/2 to 1 inch inside rectum 3-4 times daily x1 month.  [facility]'s information is below: Address: [address], [place], [place] [address]  [phone]  *Please DO NOT go directly from our office to pick up this medication! Give the pharmacy 1 day to process the prescription as this is compounded and takes time to make.  Your next hemorrhoid banding appointment is on : 02/23/24 at 3:40 pm       HEMORRHOID BANDING PROCEDURE    FOLLOW-UP CARE   The procedure you have had should have been relatively painless since the banding of the area involved does not have nerve endings and there is no pain sensation.  The rubber band cuts off the blood supply to the hemorrhoid and the band may fall off as soon as 48 hours after the banding (the band may occasionally be seen in the toilet bowl following a bowel movement). You may notice a temporary feeling of fullness in the rectum which should respond adequately to plain Tylenol or Motrin.  Following the banding, avoid strenuous exercise that evening and resume full activity the next day.  A sitz bath (soaking in a warm tub) or bidet is soothing, and can be useful for cleansing the area after bowel movements.     To avoid constipation, take two tablespoons of natural wheat bran, natural oat bran, flax, Benefiber or any over the counter fiber supplement and increase your water intake to 7-8 glasses daily.    Unless you have been prescribed anorectal medication, do not put anything inside your rectum for two weeks: No suppositories, enemas, fingers, etc.  Occasionally, you may have more bleeding than usual after the banding procedure.  This is often from the untreated hemorrhoids rather than the treated one.  Don't be concerned if there is a tablespoon or so of blood.  If there is more blood than this, lie flat with your bottom  higher than your head and apply an ice pack to the area. If the bleeding does not stop within a half an hour or if you feel faint, call our office at [phone] or go to the emergency room.  Problems are not common; however, if there is a substantial amount of bleeding, severe pain, chills, fever or difficulty passing urine (very rare) or other problems, you should call us  at [phone] or report to the nearest emergency room.  Do not stay seated continuously for more than 2-3 hours for a day or two after the procedure.  Tighten your buttock muscles 10-15 times every two hours and take 10-15 deep breaths every 1-2 hours.  Do not spend more than a few minutes on the toilet if you cannot empty your bowel; instead re-visit the toilet at a later time.      _______________________________________________________  If your blood pressure at your visit was 140/90 or greater, please contact your primary care physician to follow up on this.  _______________________________________________________  If you are age 73 or older, your body mass index should be between 23-30. Your Body mass index is 34.11 kg/m. If this is out of the aforementioned range listed, please consider follow up with your Primary Care Provider.  If you are age 64 or younger, your body mass index should be between 19-25. Your Body mass index is 34.11 kg/m. If this is out of the aformentioned range listed, please consider  follow up with your Primary Care Provider.   ________________________________________________________  The [facility] providers would like to encourage you to use MYCHART to communicate with providers for non-urgent requests or questions.  Due to long hold times on the telephone, sending your provider a message by [facility] may be a faster and more efficient way to get a response.  Please allow 48 business hours for a response.  Please remember that this is for non-urgent requests.   _______________________________________________________  Thank you for choosing me and [facility].

## 2023-12-17 ENCOUNTER — Encounter: Payer: Self-pay | Admitting: Internal Medicine

## 2023-12-17 NOTE — Progress Notes (Signed)
 Subjective:    Patient ID: Jodi [name], female    DOB: 12-09-1950, 73 y.o.   MRN: [id number]  HPI Jodi [name] is a 73 year old female with a history of previously treated H. pylori gastritis with confirmed eradication, history of jejunal diverticulitis, IBS, fatty liver, symptomatic internal hemorrhoids, anxiety, hypothyroidism who is here for follow-up.  She was last seen on 06/11/2023 by Jessica Zehr, PA-C.  She is here alone today.  She experiences discomfort from hemorrhoids, specifically noting one external hemorrhoid that is persistently bothersome. Symptoms include swelling, slight bleeding, itching, and soreness. The hemorrhoid is always outside, and she has not attempted to push it back, feeling there is 'nowhere to go'.  Her bowel movements are regular without significant constipation or straining. She has a history of H. pylori treatment, which has improved her overall gastrointestinal symptoms, and she no longer experiences issues related to jejunal diverticulitis.  She has undergone multiple negative Cologuard tests and has no stool leakage.  She does notice some mild pain with passing stool.  Currently, she manages her symptoms with sitz baths, which she refers to as her 'best friend right now'.   Review of Systems As per HPI, otherwise negative  Current Medications, Allergies, Past Medical History, Past Surgical History, Family History and Social History were reviewed in [employer] record.    Objective:   Physical Exam BP 120/70   Pulse 89   Ht 5' 5 (1.651 m)   Wt 205 lb (93 kg)   LMP 07/06/2010   SpO2 98%   BMI 34.11 kg/m  Gen: awake, alert, NAD HEENT: anicteric  Abd: soft, NT/ND, +BS throughout Rectal: Small perianal skin tags without external hemorrhoid, no external rash, tender small posterior lateral anal fissure, no masses ANOSCOPY: Using a disposable, lubricated, slotted, self-illuminating anoscope, the rectum was intubated  without difficulty. The trochar was removed and the ano-rectum was circumferentially inspected. There were internal hemorrhoids, RA=LL=RP. There was no finding of an anorectal fissure. The rectal mucosa was not inflamed. No neoplasia or other pathology was identified. The inspection was well tolerated.  Ext: no c/c/e Neuro: nonfocal      Assessment & Plan:  73 year old female with a history of previously treated H. pylori gastritis with confirmed eradication, history of jejunal diverticulitis, IBS, fatty liver, symptomatic internal hemorrhoids, anxiety, hypothyroidism who is here for follow-up.   Internal hemorrhoids with prolapse/perianal itch Chronic internal hemorrhoids with prolapse causing bleeding, itching, and soreness. External symptoms likely due to prolapsed internal hemorrhoids. Recommended rubber band ligation to alleviate symptoms. Explained procedure effectiveness and risks including rare bleeding, discomfort, and potential urinary retention. - Perform rectal examination and anoscopy to confirm diagnosis and assess hemorrhoidal disease. - Proceed with rubber band ligation today. - Educate on post-procedure expectations: no pain or pinching should be felt, slight pressure or fullness is common and should dissipate by evening. - Instruct on no activity restrictions post-procedure, but avoid lifting heavy objects until the next day. - Reassure that sitz baths can be continued post-procedure. - Discuss low risk of bleeding post-procedure, particularly when the band falls off, and very low risk of urinary retention.   PROCEDURE NOTE:  The patient presents with symptomatic grade 2 internal hemorrhoids, requesting rubber band ligation of her hemorrhoidal disease.  All risks, benefits and alternative forms of therapy were described and informed consent was obtained.   The anorectum was pre-medicated with 0.125% nitroglycerin ointment The decision was made to band the LL internal  hemorrhoid, and the [facility]  [facility] System was used to perform band ligation without complication.   Digital anorectal examination was then performed to assure proper positioning of the band, and to adjust the banded tissue as required.  The patient was discharged home without pain or other issues.  Dietary and behavioral recommendations were given and along with follow-up instructions.    The patient will return as scheduled for follow-up and possible additional banding as required. No complications were encountered and the patient tolerated the procedure well.   2.  Anal fissure - Diltiazem gel 2%, pea-sized amount applied to the anal canal 2-3 times a day until symptoms resolved and then 1 week longer  3.  Colon cancer screening Negative Cologuard in November 2023, 2020 in 2017 - Screening recommended again next year with either Cologuard or colonoscopy

## 2023-12-23 DIAGNOSIS — D696 Thrombocytopenia, unspecified: Secondary | ICD-10-CM | POA: Diagnosis not present

## 2023-12-23 DIAGNOSIS — G4733 Obstructive sleep apnea (adult) (pediatric): Secondary | ICD-10-CM | POA: Diagnosis not present

## 2023-12-23 DIAGNOSIS — G2581 Restless legs syndrome: Secondary | ICD-10-CM | POA: Diagnosis not present

## 2023-12-23 DIAGNOSIS — I10 Essential (primary) hypertension: Secondary | ICD-10-CM | POA: Diagnosis not present

## 2023-12-23 DIAGNOSIS — E039 Hypothyroidism, unspecified: Secondary | ICD-10-CM | POA: Diagnosis not present

## 2023-12-23 DIAGNOSIS — E7800 Pure hypercholesterolemia, unspecified: Secondary | ICD-10-CM | POA: Diagnosis not present

## 2023-12-27 DIAGNOSIS — M25519 Pain in unspecified shoulder: Secondary | ICD-10-CM | POA: Diagnosis not present

## 2023-12-27 DIAGNOSIS — M25539 Pain in unspecified wrist: Secondary | ICD-10-CM | POA: Diagnosis not present

## 2023-12-27 DIAGNOSIS — M25569 Pain in unspecified knee: Secondary | ICD-10-CM | POA: Diagnosis not present

## 2023-12-28 DIAGNOSIS — I700 Atherosclerosis of aorta: Secondary | ICD-10-CM | POA: Diagnosis not present

## 2023-12-28 DIAGNOSIS — E039 Hypothyroidism, unspecified: Secondary | ICD-10-CM | POA: Diagnosis not present

## 2023-12-28 DIAGNOSIS — Z0000 Encounter for general adult medical examination without abnormal findings: Secondary | ICD-10-CM | POA: Diagnosis not present

## 2023-12-28 DIAGNOSIS — E119 Type 2 diabetes mellitus without complications: Secondary | ICD-10-CM | POA: Diagnosis not present

## 2023-12-28 DIAGNOSIS — E7800 Pure hypercholesterolemia, unspecified: Secondary | ICD-10-CM | POA: Diagnosis not present

## 2023-12-28 DIAGNOSIS — G4733 Obstructive sleep apnea (adult) (pediatric): Secondary | ICD-10-CM | POA: Diagnosis not present

## 2023-12-29 DIAGNOSIS — M25569 Pain in unspecified knee: Secondary | ICD-10-CM | POA: Diagnosis not present

## 2023-12-29 DIAGNOSIS — M25539 Pain in unspecified wrist: Secondary | ICD-10-CM | POA: Diagnosis not present

## 2023-12-29 DIAGNOSIS — M25519 Pain in unspecified shoulder: Secondary | ICD-10-CM | POA: Diagnosis not present

## 2024-01-03 DIAGNOSIS — M25539 Pain in unspecified wrist: Secondary | ICD-10-CM | POA: Diagnosis not present

## 2024-01-03 DIAGNOSIS — M25569 Pain in unspecified knee: Secondary | ICD-10-CM | POA: Diagnosis not present

## 2024-01-03 DIAGNOSIS — M25519 Pain in unspecified shoulder: Secondary | ICD-10-CM | POA: Diagnosis not present

## 2024-01-06 ENCOUNTER — Encounter: Payer: Self-pay | Admitting: Internal Medicine

## 2024-01-06 DIAGNOSIS — M25539 Pain in unspecified wrist: Secondary | ICD-10-CM | POA: Diagnosis not present

## 2024-01-06 DIAGNOSIS — M25569 Pain in unspecified knee: Secondary | ICD-10-CM | POA: Diagnosis not present

## 2024-01-06 DIAGNOSIS — M25519 Pain in unspecified shoulder: Secondary | ICD-10-CM | POA: Diagnosis not present

## 2024-01-10 DIAGNOSIS — M25569 Pain in unspecified knee: Secondary | ICD-10-CM | POA: Diagnosis not present

## 2024-01-10 DIAGNOSIS — M25539 Pain in unspecified wrist: Secondary | ICD-10-CM | POA: Diagnosis not present

## 2024-01-10 DIAGNOSIS — M25519 Pain in unspecified shoulder: Secondary | ICD-10-CM | POA: Diagnosis not present

## 2024-01-20 DIAGNOSIS — M25519 Pain in unspecified shoulder: Secondary | ICD-10-CM | POA: Diagnosis not present

## 2024-01-20 DIAGNOSIS — M25569 Pain in unspecified knee: Secondary | ICD-10-CM | POA: Diagnosis not present

## 2024-01-20 DIAGNOSIS — M25539 Pain in unspecified wrist: Secondary | ICD-10-CM | POA: Diagnosis not present

## 2024-01-24 DIAGNOSIS — E1169 Type 2 diabetes mellitus with other specified complication: Secondary | ICD-10-CM | POA: Diagnosis not present

## 2024-01-24 DIAGNOSIS — G4733 Obstructive sleep apnea (adult) (pediatric): Secondary | ICD-10-CM | POA: Diagnosis not present

## 2024-01-24 DIAGNOSIS — I10 Essential (primary) hypertension: Secondary | ICD-10-CM | POA: Diagnosis not present

## 2024-01-25 ENCOUNTER — Other Ambulatory Visit: Payer: Self-pay

## 2024-01-25 MED ORDER — AMBULATORY NON FORMULARY MEDICATION: 0 refills | Status: AC

## 2024-01-28 DIAGNOSIS — L814 Other melanin hyperpigmentation: Secondary | ICD-10-CM | POA: Diagnosis not present

## 2024-01-28 DIAGNOSIS — L853 Xerosis cutis: Secondary | ICD-10-CM | POA: Diagnosis not present

## 2024-01-28 DIAGNOSIS — L309 Dermatitis, unspecified: Secondary | ICD-10-CM | POA: Diagnosis not present

## 2024-01-28 DIAGNOSIS — R208 Other disturbances of skin sensation: Secondary | ICD-10-CM | POA: Diagnosis not present

## 2024-01-28 DIAGNOSIS — L280 Lichen simplex chronicus: Secondary | ICD-10-CM | POA: Diagnosis not present

## 2024-01-28 DIAGNOSIS — L821 Other seborrheic keratosis: Secondary | ICD-10-CM | POA: Diagnosis not present

## 2024-01-28 DIAGNOSIS — D485 Neoplasm of uncertain behavior of skin: Secondary | ICD-10-CM | POA: Diagnosis not present

## 2024-02-07 DIAGNOSIS — M25569 Pain in unspecified knee: Secondary | ICD-10-CM | POA: Diagnosis not present

## 2024-02-07 DIAGNOSIS — M25519 Pain in unspecified shoulder: Secondary | ICD-10-CM | POA: Diagnosis not present

## 2024-02-07 DIAGNOSIS — M25539 Pain in unspecified wrist: Secondary | ICD-10-CM | POA: Diagnosis not present

## 2024-02-14 DIAGNOSIS — H401113 Primary open-angle glaucoma, right eye, severe stage: Secondary | ICD-10-CM | POA: Diagnosis not present

## 2024-02-14 DIAGNOSIS — H401121 Primary open-angle glaucoma, left eye, mild stage: Secondary | ICD-10-CM | POA: Diagnosis not present

## 2024-02-14 DIAGNOSIS — H47231 Glaucomatous optic atrophy, right eye: Secondary | ICD-10-CM | POA: Diagnosis not present

## 2024-02-14 DIAGNOSIS — H53431 Sector or arcuate defects, right eye: Secondary | ICD-10-CM | POA: Diagnosis not present

## 2024-02-14 DIAGNOSIS — H2513 Age-related nuclear cataract, bilateral: Secondary | ICD-10-CM | POA: Diagnosis not present

## 2024-02-15 NOTE — Progress Notes (Signed)
 [name], PA-C [address] [place], [place]  [address] Phone: [phone]   Primary Care Physician: Claudene Pellet, MD  Primary Gastroenterologist:  [name], PA-C / Dr. Gordy Starch   Chief Complaint: Follow-up anal fissure and internal hemorrhoids      HPI:   Jodi [name] is a 74 y.o. female, established patient Dr. Starch, returns for follow-up of internal hemorrhoids and anal fissure.  She also has history of H. pylori gastritis (treated and eradicated), history of jejunal diverticulitis, IBS, fatty liver, anxiety, and hypothyroidism.  Cholecystectomy 2019.  Patient last saw Dr. Starch in our office 12/16/2023 to evaluate hemorrhoids.  She was found to have symptomatic grade 2 internal hemorrhoids with prolapse.  Treated with internal hemorrhoid banding.  She was also found to have anal fissure treated with diltiazem lidocaine  gel 2%.  Regarding colon cancer screening: She has had negative Cologuard's in November 2023, 2020, and 2017.  She will be due for a 3-year repeat screening Cologuard or colonoscopy 05/2025.  Current symptoms: Patient states her hemorrhoids and anal fissure have improved.  She still has a mild pain to the right of the rectum with 1 sore tender area over the fissure.  Pain is less severe and less frequent.  Rectal bleeding has greatly decreased.  She has seen a tiny drop of blood on the tissue on occasion.  Currently having bowel movement 1-3 times a day which is soft.  She denies hard stools or straining.  MiraLAX has helped.  She used diltiazem lidocaine  cream and warm water sitz bath's which helped.  Feels like the anal fissure has not completely resolved.  She is scheduled for another repeat internal hemorrhoid banding next week, however she does not want to do the procedure until fissure has healed.  Current Outpatient Medications  Medication Sig Dispense Refill   ALPRAZolam  (XANAX ) 0.25 MG tablet Take 0.125 mg by mouth every 6 (six) hours  as needed for anxiety.     AMBULATORY NON FORMULARY MEDICATION Diltiazem 2%/ Lidocaine  5% Apply pea size amount 1/2 to 1 inch inside rectum 3-4 times daily x1 month. 30 g 0   Cyanocobalamin 1000 MCG TBCR Take 1 tablet by mouth daily.     estradiol  (ESTRACE ) 0.1 MG/GM vaginal cream INESRT 1 GRAM VAGINALLY TWICE WEEKLY FOR 4 WEEKS BEFORE FOLLOW UP APPT 42.5 g 5   fluticasone (FLONASE) 50 MCG/ACT nasal spray Place 2 sprays into both nostrils daily as needed for allergies.      lactase (LACTAID) 3000 units tablet Take 3,000 Units by mouth as needed (lactaid).      levothyroxine (SYNTHROID) 112 MCG tablet Take 112 mcg by mouth daily.     Magnesium  200 MG TABS Take 200 mg by mouth daily.     metoprolol  tartrate (LOPRESSOR ) 100 MG tablet Take 1 tablet (100 mg total) by mouth once for 1 dose. Take 90 minutes to 2 Hours Prior to Scan. 1 tablet 0   MOUNJARO 2.5 MG/0.5ML Pen SMARTSIG:2.5 Milligram(s) SUB-Q Once a Week     Multiple Vitamin (MULTIVITAMIN WITH MINERALS) TABS Take 1 tablet by mouth daily.     rosuvastatin  (CRESTOR ) 5 MG tablet Take 1 tablet (5 mg total) by mouth 3 (three) times a week. 45 tablet 3   saccharomyces boulardii (FLORASTOR) 250 MG capsule Take one tablet BID x 1 month 60 capsule 0   timolol (TIMOPTIC) 0.5 % ophthalmic solution Place 1 drop into both eyes every morning.  3  No current facility-administered medications for this visit.    Allergies as of 02/16/2024 - Review Complete 12/17/2023  Allergen Reaction Noted   Sulfites Anaphylaxis 09/20/2012   Amlodipine  Other (See Comments) 06/25/2020   Iodine  12/01/2012   Macrodantin [nitrofurantoin macrocrystal] Nausea Only and Other (See Comments) 12/01/2012   Serotonin reuptake inhibitors (ssris) Other (See Comments) 06/20/2020   Shellfish allergy  12/01/2012   Venlafaxine Other (See Comments) 06/20/2020   Almond oil Itching and Rash 12/21/2017   Bentyl  [dicyclomine  hcl] Anxiety 05/26/2017    Past Medical History:   Diagnosis Date   Abnormal Pap smear of cervix    Anxiety    Aortic atherosclerosis (HCC)    Atypical chest pain 10/08/2020   Symptoms are very atypical.  She feels good with exercise and notes that in general she is feeling better since she started the exercise program.  She had a normal stress test in 2021.  No repeat ischemia testing advised.  She does also report difficulty and pain with swallowing with referred pain into her back.  She has GI follow-up.   Diverticulitis    Fatty liver disease, nonalcoholic    Glaucoma    H. pylori infection    H/O dizziness    Hemorrhoids    History of depression    incest survivor/dysthymia   Hypertension    Hyperthyroidism    Hypokalemia 04/29/2020   IBS (irritable bowel syndrome)    lactose intolerance   OSA (obstructive sleep apnea) 11/17/2012   Palpitations 04/29/2020   Restless leg syndrome    Sleep apnea    Snoring 11/17/2012   Thyroid  disease    hypothyroid   Varicose veins     Past Surgical History:  Procedure Laterality Date   CHOLECYSTECTOMY  07/19/2017   Dr. Ebbie   COLPOSCOPY  99, 00, 02, 04, 06   ENDOVENOUS ABLATION SAPHENOUS VEIN W/ LASER Right 04-08-2015   endovenous laser ablation (right greater saphenous vein) by RODGER Collum MD     LEEP  3/09   CIN II/III    Review of Systems:    All systems reviewed and negative except where noted in HPI.    Physical Exam:  LMP 07/06/2010  Patient's last menstrual period was 07/06/2010.  General: Well-nourished, well-developed in no acute distress.  Lungs: Clear to auscultation bilaterally. Non-labored. Heart: Regular rate and rhythm, no murmurs rubs or gallops.  Abdomen: Bowel sounds are normal; Abdomen is Soft; No hepatosplenomegaly, masses or hernias;  No Abdominal Tenderness; No guarding or rebound tenderness. Rectal: Small external hemorrhoid skin tags present.  She does not have any swollen or thrombosed external hemorrhoids.  There is a 5 mm fissure visible to the  right side of the rectum which is mildly tender.  I did not do an internal exam due to the fissure. Neuro: Alert and oriented x 3.  Grossly intact.  Psych: Alert and cooperative, normal mood and affect.  Chaperone for Exam:  Alethea Blocker, CMA   Imaging Studies: No results found.  Labs: CBC    Component Value Date/Time   WBC 5.8 04/27/2020 1146   RBC 4.72 04/27/2020 1146   HGB 14.9 04/27/2020 1146   HGB 14.2 12/21/2013 1155   HCT 43.9 04/27/2020 1146   PLT 175 04/27/2020 1146   MCV 93.0 04/27/2020 1146   MCH 31.6 04/27/2020 1146   MCHC 33.9 04/27/2020 1146   RDW 13.4 04/27/2020 1146   LYMPHSABS 1.1 04/23/2020 0745   MONOABS 0.4 04/23/2020 0745   EOSABS 0.1  04/23/2020 0745   BASOSABS 0.0 04/23/2020 0745    CMP     Component Value Date/Time   NA 143 10/29/2022 0845   K 4.6 10/29/2022 0845   CL 106 10/29/2022 0845   CO2 22 10/29/2022 0845   GLUCOSE 125 (H) 10/29/2022 0845   GLUCOSE 109 (H) 04/27/2020 1146   BUN 10 10/29/2022 0845   CREATININE 0.80 10/29/2022 0845   CALCIUM  9.2 10/29/2022 0845   PROT 6.5 10/29/2022 0845   ALBUMIN 4.2 10/29/2022 0845   AST 21 10/29/2022 0845   ALT 24 10/29/2022 0845   ALKPHOS 60 10/29/2022 0845   BILITOT 0.5 10/29/2022 0845   GFRNONAA 109 05/15/2020 0936   GFRNONAA >60 04/27/2020 1146   GFRAA 126 05/15/2020 0936       Assessment and Plan:   Jodi [name] is a 73 y.o. y/o female returns for follow-up of:  Anal Fissure - Improved, yet not resolved Internal hemorrhoids Improved s/p Banding Mild Constipation - Improved on Tx  Plan: - Continue MiraLAX 1 or 2 capfuls daily. - Cancel internal hemorrhoid banding procedure per patient request. - Continue warm water sitz bath with Epsom salt. - Refilled diltiazem lidocaine  cream, use 3 times daily until fissure resolves, 30 g with 1 refill. - Continue moist baby wipes and avoid dry tissue. - She can use A&E ointment prior to bowel movements as a skin protectant. - Discussed  surgery as a last resort if anal fissure does not heal.   [name], PA-C  Follow up in 6 weeks with TG.

## 2024-02-16 ENCOUNTER — Encounter: Payer: Self-pay | Admitting: Physician Assistant

## 2024-02-16 ENCOUNTER — Ambulatory Visit: Admitting: Physician Assistant

## 2024-02-16 VITALS — BP 134/68 | HR 92 | Ht 64.5 in | Wt 202.4 lb

## 2024-02-16 DIAGNOSIS — K648 Other hemorrhoids: Secondary | ICD-10-CM

## 2024-02-16 DIAGNOSIS — K602 Anal fissure, unspecified: Secondary | ICD-10-CM | POA: Diagnosis not present

## 2024-02-16 DIAGNOSIS — K5900 Constipation, unspecified: Secondary | ICD-10-CM

## 2024-02-16 MED ORDER — AMBULATORY NON FORMULARY MEDICATION: 1 refills | Status: AC

## 2024-02-16 NOTE — Patient Instructions (Signed)
 We have sent a prescription for Diltiazem/ Lidocaine  gel to [facility]. Apply a pea size amount 1/2 to 1 inch inside rectum 3-4 times daily x1 month.  [facility]'s information is below: Address: [address], [place], [place] [address]  [phone]  *Please DO NOT go directly from our office to pick up this medication! Give the pharmacy 1 day to process the prescription as this is compounded and takes time to make.  Please follow up sooner if symptoms increase or worsen  Due to recent changes in healthcare laws, you may see the results of your imaging and laboratory studies on MyChart before your provider has had a chance to review them.  We understand that in some cases there may be results that are confusing or concerning to you. Not all laboratory results come back in the same time frame and the provider may be waiting for multiple results in order to interpret others.  Please give us  48 hours in order for your provider to thoroughly review all the results before contacting the office for clarification of your results.   Thank you for trusting me with your gastrointestinal care!   [name], PA-C _______________________________________________________  If your blood pressure at your visit was 140/90 or greater, please contact your primary care physician to follow up on this.  _______________________________________________________  If you are age 65 or older, your body mass index should be between 23-30. Your Body mass index is 34.2 kg/m. If this is out of the aforementioned range listed, please consider follow up with your Primary Care Provider.  If you are age 38 or younger, your body mass index should be between 19-25. Your Body mass index is 34.2 kg/m. If this is out of the aformentioned range listed, please consider follow up with your Primary Care Provider.   ________________________________________________________  The [facility] providers would  like to encourage you to use MYCHART to communicate with providers for non-urgent requests or questions.  Due to long hold times on the telephone, sending your provider a message by [facility] may be a faster and more efficient way to get a response.  Please allow 48 business hours for a response.  Please remember that this is for non-urgent requests.  _______________________________________________________

## 2024-02-23 ENCOUNTER — Ambulatory Visit: Admitting: Internal Medicine

## 2024-02-29 DIAGNOSIS — E1169 Type 2 diabetes mellitus with other specified complication: Secondary | ICD-10-CM | POA: Diagnosis not present

## 2024-02-29 DIAGNOSIS — I10 Essential (primary) hypertension: Secondary | ICD-10-CM | POA: Diagnosis not present

## 2024-02-29 DIAGNOSIS — G4733 Obstructive sleep apnea (adult) (pediatric): Secondary | ICD-10-CM | POA: Diagnosis not present

## 2024-03-13 DIAGNOSIS — E0500 Thyrotoxicosis with diffuse goiter without thyrotoxic crisis or storm: Secondary | ICD-10-CM | POA: Diagnosis not present

## 2024-03-13 DIAGNOSIS — H401132 Primary open-angle glaucoma, bilateral, moderate stage: Secondary | ICD-10-CM | POA: Diagnosis not present

## 2024-03-13 DIAGNOSIS — H25013 Cortical age-related cataract, bilateral: Secondary | ICD-10-CM | POA: Diagnosis not present

## 2024-03-13 DIAGNOSIS — H2511 Age-related nuclear cataract, right eye: Secondary | ICD-10-CM | POA: Diagnosis not present

## 2024-03-13 DIAGNOSIS — H2513 Age-related nuclear cataract, bilateral: Secondary | ICD-10-CM | POA: Diagnosis not present

## 2024-03-27 DIAGNOSIS — M25539 Pain in unspecified wrist: Secondary | ICD-10-CM | POA: Diagnosis not present

## 2024-03-27 DIAGNOSIS — M25519 Pain in unspecified shoulder: Secondary | ICD-10-CM | POA: Diagnosis not present

## 2024-03-27 DIAGNOSIS — M25569 Pain in unspecified knee: Secondary | ICD-10-CM | POA: Diagnosis not present

## 2024-03-29 DIAGNOSIS — E039 Hypothyroidism, unspecified: Secondary | ICD-10-CM | POA: Diagnosis not present

## 2024-03-29 DIAGNOSIS — G2581 Restless legs syndrome: Secondary | ICD-10-CM | POA: Diagnosis not present

## 2024-03-29 DIAGNOSIS — G4733 Obstructive sleep apnea (adult) (pediatric): Secondary | ICD-10-CM | POA: Diagnosis not present

## 2024-03-29 DIAGNOSIS — L309 Dermatitis, unspecified: Secondary | ICD-10-CM | POA: Diagnosis not present

## 2024-04-03 NOTE — Progress Notes (Unsigned)
 [name], PA-C [address] [place], [place]  [address] Phone: [phone]   Primary Care Physician: Claudene Pellet, MD  Primary Gastroenterologist:  [name], PA-C / Dr. Gordy Starch   Chief Complaint: Follow-up anal fissure and internal hemorrhoids       HPI:   Jodi [name] is a 73 y.o. female returns for 6-week follow-up of anal fissure and internal hemorrhoids.  She is taking Senokot for mild constipation with benefit.  She used diltiazem lidocaine  cream and warm water sitz baths with great benefit.  Has also used some A&E ointment which helped.  She could not tolerate MiraLAX which caused colon irritation.  She is currently having a soft bowel movement once daily with no hard stools or straining.  No more rectal pain or rectal bleeding in the past 3 weeks.  She feels like the fissure has healed.  She feels 99% better.  She has a lot of health anxiety.  She is worried that the fissure may return.  Patient last saw Dr. Starch in our office 12/16/2023 to evaluate hemorrhoids.  She was found to have symptomatic grade 2 internal hemorrhoids with prolapse.  Treated with internal hemorrhoid banding.  She was also found to have anal fissure treated with diltiazem lidocaine  gel 2%.   Regarding colon cancer screening: She has had negative Cologuard's in November 2023, 2020, and 2017.  She will be due for a 3-year repeat screening Cologuard 05/2025.  Patient has adamantly declined traditional colonoscopy.  She is afraid of the procedure and sedation.  Current Outpatient Medications  Medication Sig Dispense Refill   ALPRAZolam  (XANAX ) 0.25 MG tablet Take 0.125 mg by mouth every 6 (six) hours as needed for anxiety.     AMBULATORY NON FORMULARY MEDICATION Diltiazem 2%/ Lidocaine  5% Apply pea size amount 1/2 to 1 inch inside rectum 3-4 times daily x1 month. 30 g 0   AMBULATORY NON FORMULARY MEDICATION Diltiazem 2%/ Lidocaine  5% Apply pea size amount 1/2 to 1 inch inside rectum  3-4 times daily x1 month. 30 g 1   Cyanocobalamin 1000 MCG TBCR Take 1 tablet by mouth daily.     dorzolamide-timolol (COSOPT) 2-0.5 % ophthalmic solution Place 1 drop into both eyes 2 (two) times daily.     estradiol  (ESTRACE ) 0.1 MG/GM vaginal cream INESRT 1 GRAM VAGINALLY TWICE WEEKLY FOR 4 WEEKS BEFORE FOLLOW UP APPT 42.5 g 5   fluticasone (FLONASE) 50 MCG/ACT nasal spray Place 2 sprays into both nostrils daily as needed for allergies.      Folic Acid (FOLATE PO) Take 1 tablet by mouth daily.     Garlic 650 MG CAPS Take 1 tablet by mouth 3 (three) times a week.     lactase (LACTAID) 3000 units tablet Take 3,000 Units by mouth as needed (lactaid).      levothyroxine (SYNTHROID) 125 MCG tablet Take 125 mcg by mouth daily before breakfast.     Magnesium  200 MG TABS Take 200 mg by mouth daily.     Misc Natural Products (BEET ROOT) 500 MG CAPS Take 1 capsule by mouth 3 (three) times a week.     MOUNJARO 2.5 MG/0.5ML Pen SMARTSIG:2.5 Milligram(s) SUB-Q Once a Week     saccharomyces boulardii (FLORASTOR) 250 MG capsule Take one tablet BID x 1 month 60 capsule 0   timolol (TIMOPTIC) 0.5 % ophthalmic solution Place 1 drop into both eyes every morning.  3   No current facility-administered medications for this visit.  Allergies as of 04/04/2024 - Review Complete 04/04/2024  Allergen Reaction Noted   Sulfites Anaphylaxis 09/20/2012   Amlodipine  Other (See Comments) 06/25/2020   Macrodantin [nitrofurantoin macrocrystal] Nausea Only and Other (See Comments) 12/01/2012   Pecan nut (diagnostic)  02/16/2024   Serotonin reuptake inhibitors (ssris) Other (See Comments) 06/20/2020   Venlafaxine Other (See Comments) 06/20/2020   Bentyl  [dicyclomine  hcl] Anxiety 05/26/2017    Past Medical History:  Diagnosis Date   Abnormal Pap smear of cervix    Anxiety    Aortic atherosclerosis    Atypical chest pain 10/08/2020   Symptoms are very atypical.  She feels good with exercise and notes that in general  she is feeling better since she started the exercise program.  She had a normal stress test in 2021.  No repeat ischemia testing advised.  She does also report difficulty and pain with swallowing with referred pain into her back.  She has GI follow-up.   Diverticulitis    Fatty liver disease, nonalcoholic    Glaucoma    H. pylori infection    H/O dizziness    Hemorrhoids    History of depression    incest survivor/dysthymia   Hypertension    Hyperthyroidism    Hypokalemia 04/29/2020   IBS (irritable bowel syndrome)    lactose intolerance   OSA (obstructive sleep apnea) 11/17/2012   Palpitations 04/29/2020   Restless leg syndrome    Sleep apnea    Snoring 11/17/2012   Thyroid  disease    hypothyroid   Varicose veins     Past Surgical History:  Procedure Laterality Date   CHOLECYSTECTOMY  07/19/2017   Dr. Ebbie   COLPOSCOPY  99, 00, 02, 04, 06   ENDOVENOUS ABLATION SAPHENOUS VEIN W/ LASER Right 04-08-2015   endovenous laser ablation (right greater saphenous vein) by RODGER Collum MD     LEEP  3/09   CIN II/III    Review of Systems:    All systems reviewed and negative except where noted in HPI.    Physical Exam:  BP 110/70   Pulse 87   Ht 5' 5.5 (1.664 m)   Wt 204 lb (92.5 kg)   LMP 07/06/2010   BMI 33.43 kg/m  Patient's last menstrual period was 07/06/2010.  General: Well-nourished, well-developed in no acute distress.  Neuro: Alert and oriented x 3.  Grossly intact.  Psych: Alert and cooperative, anxious mood and affect.   Imaging Studies: No results found.  Labs: CBC    Component Value Date/Time   WBC 5.8 04/27/2020 1146   RBC 4.72 04/27/2020 1146   HGB 14.9 04/27/2020 1146   HGB 14.2 12/21/2013 1155   HCT 43.9 04/27/2020 1146   PLT 175 04/27/2020 1146   MCV 93.0 04/27/2020 1146   MCH 31.6 04/27/2020 1146   MCHC 33.9 04/27/2020 1146   RDW 13.4 04/27/2020 1146   LYMPHSABS 1.1 04/23/2020 0745   MONOABS 0.4 04/23/2020 0745   EOSABS 0.1 04/23/2020  0745   BASOSABS 0.0 04/23/2020 0745    CMP     Component Value Date/Time   NA 143 10/29/2022 0845   K 4.6 10/29/2022 0845   CL 106 10/29/2022 0845   CO2 22 10/29/2022 0845   GLUCOSE 125 (H) 10/29/2022 0845   GLUCOSE 109 (H) 04/27/2020 1146   BUN 10 10/29/2022 0845   CREATININE 0.80 10/29/2022 0845   CALCIUM  9.2 10/29/2022 0845   PROT 6.5 10/29/2022 0845   ALBUMIN 4.2 10/29/2022 0845   AST 21 10/29/2022  0845   ALT 24 10/29/2022 0845   ALKPHOS 60 10/29/2022 0845   BILITOT 0.5 10/29/2022 0845   GFRNONAA 109 05/15/2020 0936   GFRNONAA >60 04/27/2020 1146   GFRAA 126 05/15/2020 0936     Assessment and Plan:   Jodi [name] is a 73 y.o. y/o female returns for follow-up of:  Anal Fissure: Improved / Resolved - Continue current symptomatic treatment - I stressed the importance of treating underlying constipation to prevent recurrent anal fissure.  Patient expressed understanding.  Mild Constipation - Continue Senokot-S daily. - Recommend High Fiber diet with fruits, vegetables, and whole grains. - Drink 64 ounces of Fluids Daily.   Colon Cancer Screening - 3 year repeat Cologuard will be due 05/2025 (reminder recall was created in MyChart). - I offered to schedule traditional colonoscopy, and patient declined.  [name], PA-C  Follow up as needed if she has recurrent GI symptoms.

## 2024-04-04 ENCOUNTER — Encounter: Payer: Self-pay | Admitting: Physician Assistant

## 2024-04-04 ENCOUNTER — Ambulatory Visit: Admitting: Physician Assistant

## 2024-04-04 VITALS — BP 110/70 | HR 87 | Ht 65.5 in | Wt 204.0 lb

## 2024-04-04 DIAGNOSIS — K602 Anal fissure, unspecified: Secondary | ICD-10-CM | POA: Diagnosis not present

## 2024-04-04 DIAGNOSIS — K5900 Constipation, unspecified: Secondary | ICD-10-CM | POA: Diagnosis not present

## 2024-04-04 DIAGNOSIS — Z1211 Encounter for screening for malignant neoplasm of colon: Secondary | ICD-10-CM | POA: Diagnosis not present

## 2024-04-04 NOTE — Patient Instructions (Addendum)
 Continue Senokot daily for constipation  Please follow up sooner if symptoms increase or worsen  Due to recent changes in healthcare laws, you may see the results of your imaging and laboratory studies on MyChart before your provider has had a chance to review them.  We understand that in some cases there may be results that are confusing or concerning to you. Not all laboratory results come back in the same time frame and the provider may be waiting for multiple results in order to interpret others.  Please give us  48 hours in order for your provider to thoroughly review all the results before contacting the office for clarification of your results.   Thank you for trusting me with your gastrointestinal care!   [name], PA-C _______________________________________________________  If your blood pressure at your visit was 140/90 or greater, please contact your primary care physician to follow up on this.  _______________________________________________________  If you are age 24 or older, your body mass index should be between 23-30. Your Body mass index is 33.43 kg/m. If this is out of the aforementioned range listed, please consider follow up with your Primary Care Provider.  If you are age 40 or younger, your body mass index should be between 19-25. Your Body mass index is 33.43 kg/m. If this is out of the aformentioned range listed, please consider follow up with your Primary Care Provider.   ________________________________________________________  The [facility] providers would like to encourage you to use MYCHART to communicate with providers for non-urgent requests or questions.  Due to long hold times on the telephone, sending your provider a message by [employer] [facility] may be a faster and more efficient way to get a response.  Please allow 48 business hours for a response.  Please remember that this is for non-urgent requests.  _______________________________________________________

## 2024-04-05 DIAGNOSIS — I10 Essential (primary) hypertension: Secondary | ICD-10-CM | POA: Diagnosis not present

## 2024-04-05 DIAGNOSIS — G4733 Obstructive sleep apnea (adult) (pediatric): Secondary | ICD-10-CM | POA: Diagnosis not present

## 2024-04-05 DIAGNOSIS — E1169 Type 2 diabetes mellitus with other specified complication: Secondary | ICD-10-CM | POA: Diagnosis not present

## 2024-04-06 DIAGNOSIS — M25539 Pain in unspecified wrist: Secondary | ICD-10-CM | POA: Diagnosis not present

## 2024-04-06 DIAGNOSIS — M25519 Pain in unspecified shoulder: Secondary | ICD-10-CM | POA: Diagnosis not present

## 2024-04-06 DIAGNOSIS — M25569 Pain in unspecified knee: Secondary | ICD-10-CM | POA: Diagnosis not present

## 2024-04-08 DIAGNOSIS — N3001 Acute cystitis with hematuria: Secondary | ICD-10-CM | POA: Diagnosis not present

## 2024-04-08 DIAGNOSIS — R300 Dysuria: Secondary | ICD-10-CM | POA: Diagnosis not present

## 2024-04-14 DIAGNOSIS — M25519 Pain in unspecified shoulder: Secondary | ICD-10-CM | POA: Diagnosis not present

## 2024-04-14 DIAGNOSIS — M25539 Pain in unspecified wrist: Secondary | ICD-10-CM | POA: Diagnosis not present

## 2024-04-14 DIAGNOSIS — M25569 Pain in unspecified knee: Secondary | ICD-10-CM | POA: Diagnosis not present

## 2024-04-20 DIAGNOSIS — M25539 Pain in unspecified wrist: Secondary | ICD-10-CM | POA: Diagnosis not present

## 2024-04-20 DIAGNOSIS — M25569 Pain in unspecified knee: Secondary | ICD-10-CM | POA: Diagnosis not present

## 2024-04-20 DIAGNOSIS — M25519 Pain in unspecified shoulder: Secondary | ICD-10-CM | POA: Diagnosis not present

## 2024-05-30 NOTE — Progress Notes (Unsigned)
 Jodi Console, PA-C [address] [place], [place]  [address] Phone: [phone]   Primary Care Physician: Claudene Pellet, MD  Primary Gastroenterologist:  Jodi Console, PA-C / ***  Chief Complaint: Follow-up anal fissure       HPI:   Discussed the use of AI scribe software for clinical note transcription with the patient, who gave verbal consent to proceed.  I last saw patient 04/04/2024 to follow-up for anal fissure and internal hemorrhoids.  Symptoms greatly improved after using Senokot S, diltiazem lidocaine  cream, and A&E ointment.  Unable to tolerate MiraLAX. History of Present Illness   Patient last saw Dr. Albertus in our office 12/16/2023 to evaluate hemorrhoids.  She was found to have symptomatic grade 2 internal hemorrhoids with prolapse.  Treated with internal hemorrhoid banding.  She was also found to have anal fissure treated with diltiazem lidocaine  gel 2%.   Regarding colon cancer screening: She has had negative Cologuard's in November 2023, 2020, and 2017.  She will be due for a 3-year repeat screening Cologuard 05/2025.  Patient has adamantly declined traditional colonoscopy.  She is afraid of the procedure and sedation.    Current Outpatient Medications  Medication Sig Dispense Refill   ALPRAZolam  (XANAX ) 0.25 MG tablet Take 0.125 mg by mouth every 6 (six) hours as needed for anxiety.     AMBULATORY NON FORMULARY MEDICATION Diltiazem 2%/ Lidocaine  5% Apply pea size amount 1/2 to 1 inch inside rectum 3-4 times daily x1 month. 30 g 0   AMBULATORY NON FORMULARY MEDICATION Diltiazem 2%/ Lidocaine  5% Apply pea size amount 1/2 to 1 inch inside rectum 3-4 times daily x1 month. 30 g 1   Cyanocobalamin 1000 MCG TBCR Take 1 tablet by mouth daily.     dorzolamide-timolol (COSOPT) 2-0.5 % ophthalmic solution Place 1 drop into both eyes 2 (two) times daily.     estradiol  (ESTRACE ) 0.1 MG/GM vaginal cream INESRT 1 GRAM VAGINALLY TWICE WEEKLY FOR 4 WEEKS BEFORE FOLLOW UP  APPT 42.5 g 5   fluticasone (FLONASE) 50 MCG/ACT nasal spray Place 2 sprays into both nostrils daily as needed for allergies.      Folic Acid (FOLATE PO) Take 1 tablet by mouth daily.     Garlic 650 MG CAPS Take 1 tablet by mouth 3 (three) times a week.     lactase (LACTAID) 3000 units tablet Take 3,000 Units by mouth as needed (lactaid).      levothyroxine (SYNTHROID) 125 MCG tablet Take 125 mcg by mouth daily before breakfast.     Magnesium  200 MG TABS Take 200 mg by mouth daily.     Misc Natural Products (BEET ROOT) 500 MG CAPS Take 1 capsule by mouth 3 (three) times a week.     MOUNJARO 2.5 MG/0.5ML Pen SMARTSIG:2.5 Milligram(s) SUB-Q Once a Week     saccharomyces boulardii (FLORASTOR) 250 MG capsule Take one tablet BID x 1 month 60 capsule 0   timolol (TIMOPTIC) 0.5 % ophthalmic solution Place 1 drop into both eyes every morning.  3   No current facility-administered medications for this visit.    Allergies as of 05/31/2024 - Review Complete 04/04/2024  Allergen Reaction Noted   Sulfites Anaphylaxis 09/20/2012   Amlodipine  Other (See Comments) 06/25/2020   Macrodantin [nitrofurantoin macrocrystal] Nausea Only and Other (See Comments) 12/01/2012   Pecan nut (diagnostic)  02/16/2024   Serotonin reuptake inhibitors (ssris) Other (See Comments) 06/20/2020   Venlafaxine Other (See Comments) 06/20/2020   Bentyl  [dicyclomine  hcl]  Anxiety 05/26/2017    Past Medical History:  Diagnosis Date   Abnormal Pap smear of cervix    Anxiety    Aortic atherosclerosis    Atypical chest pain 10/08/2020   Symptoms are very atypical.  She feels good with exercise and notes that in general she is feeling better since she started the exercise program.  She had a normal stress test in 2021.  No repeat ischemia testing advised.  She does also report difficulty and pain with swallowing with referred pain into her back.  She has GI follow-up.   Diverticulitis    Fatty liver disease, nonalcoholic    Glaucoma     H. pylori infection    H/O dizziness    Hemorrhoids    History of depression    incest survivor/dysthymia   Hypertension    Hyperthyroidism    Hypokalemia 04/29/2020   IBS (irritable bowel syndrome)    lactose intolerance   OSA (obstructive sleep apnea) 11/17/2012   Palpitations 04/29/2020   Restless leg syndrome    Sleep apnea    Snoring 11/17/2012   Thyroid  disease    hypothyroid   Varicose veins     Past Surgical History:  Procedure Laterality Date   CHOLECYSTECTOMY  07/19/2017   Dr. Ebbie   COLPOSCOPY  99, 00, 02, 04, 06   ENDOVENOUS ABLATION SAPHENOUS VEIN W/ LASER Right 04-08-2015   endovenous laser ablation (right greater saphenous vein) by RODGER Collum MD     LEEP  3/09   CIN II/III    Review of Systems:    All systems reviewed and negative except where noted in HPI.    Physical Exam:  LMP 07/06/2010  Patient's last menstrual period was 07/06/2010.  General: Well-nourished, well-developed in no acute distress.  Lungs: Clear to auscultation bilaterally. Non-labored. Heart: Regular rate and rhythm, no murmurs rubs or gallops.  Abdomen: Bowel sounds are normal; Abdomen is Soft; No hepatosplenomegaly, masses or hernias;  No Abdominal Tenderness; No guarding or rebound tenderness. Neuro: Alert and oriented x 3.  Grossly intact.  Psych: Alert and cooperative, normal mood and affect.   Imaging Studies: No results found.  Labs: CBC    Component Value Date/Time   WBC 5.8 04/27/2020 1146   RBC 4.72 04/27/2020 1146   HGB 14.9 04/27/2020 1146   HGB 14.2 12/21/2013 1155   HCT 43.9 04/27/2020 1146   PLT 175 04/27/2020 1146   MCV 93.0 04/27/2020 1146   MCH 31.6 04/27/2020 1146   MCHC 33.9 04/27/2020 1146   RDW 13.4 04/27/2020 1146   LYMPHSABS 1.1 04/23/2020 0745   MONOABS 0.4 04/23/2020 0745   EOSABS 0.1 04/23/2020 0745   BASOSABS 0.0 04/23/2020 0745    CMP     Component Value Date/Time   NA 143 10/29/2022 0845   K 4.6 10/29/2022 0845   CL 106  10/29/2022 0845   CO2 22 10/29/2022 0845   GLUCOSE 125 (H) 10/29/2022 0845   GLUCOSE 109 (H) 04/27/2020 1146   BUN 10 10/29/2022 0845   CREATININE 0.80 10/29/2022 0845   CALCIUM  9.2 10/29/2022 0845   PROT 6.5 10/29/2022 0845   ALBUMIN 4.2 10/29/2022 0845   AST 21 10/29/2022 0845   ALT 24 10/29/2022 0845   ALKPHOS 60 10/29/2022 0845   BILITOT 0.5 10/29/2022 0845   GFRNONAA 109 05/15/2020 0936   GFRNONAA >60 04/27/2020 1146   GFRAA 126 05/15/2020 0936       Assessment and Plan:   Jodi [name] is a  73 y.o. y/o female ***  Assessment and Plan Assessment & Plan       Jodi Console, PA-C  Follow up ***

## 2024-05-31 ENCOUNTER — Encounter: Payer: Self-pay | Admitting: Physician Assistant

## 2024-05-31 ENCOUNTER — Ambulatory Visit: Admitting: Physician Assistant

## 2024-05-31 VITALS — BP 110/58 | HR 78 | Ht 65.0 in | Wt 201.2 lb

## 2024-05-31 DIAGNOSIS — K602 Anal fissure, unspecified: Secondary | ICD-10-CM | POA: Diagnosis not present

## 2024-05-31 DIAGNOSIS — K582 Mixed irritable bowel syndrome: Secondary | ICD-10-CM | POA: Diagnosis not present

## 2024-05-31 DIAGNOSIS — Z1211 Encounter for screening for malignant neoplasm of colon: Secondary | ICD-10-CM

## 2024-05-31 NOTE — Patient Instructions (Addendum)
 You are due for a Cologuard in November of 2026 we will give you a call to inform you that the test has been sent.   Please follow up sooner if symptoms increase or worsen  Due to recent changes in healthcare laws, you may see the results of your imaging and laboratory studies on MyChart before your provider has had a chance to review them.  We understand that in some cases there may be results that are confusing or concerning to you. Not all laboratory results come back in the same time frame and the provider may be waiting for multiple results in order to interpret others.  Please give us  48 hours in order for your provider to thoroughly review all the results before contacting the office for clarification of your results.   Thank you for trusting me with your gastrointestinal care!   [name], PA-C _______________________________________________________  If your blood pressure at your visit was 140/90 or greater, please contact your primary care physician to follow up on this.  _______________________________________________________  If you are age 73 or older, your body mass index should be between 23-30. Your Body mass index is 33.49 kg/m. If this is out of the aforementioned range listed, please consider follow up with your Primary Care Provider.  If you are age 75 or younger, your body mass index should be between 19-25. Your Body mass index is 33.49 kg/m. If this is out of the aformentioned range listed, please consider follow up with your Primary Care Provider.   ________________________________________________________  The [facility] providers would like to encourage you to use MYCHART to communicate with providers for non-urgent requests or questions.  Due to long hold times on the telephone, sending your provider a message by [facility] may be a faster and more efficient way to get a response.  Please allow 48 business hours for a response.  Please remember that this is for  non-urgent requests.  _______________________________________________________

## 2024-06-05 ENCOUNTER — Other Ambulatory Visit (HOSPITAL_COMMUNITY)
Admission: RE | Admit: 2024-06-05 | Discharge: 2024-06-05 | Disposition: A | Source: Ambulatory Visit | Attending: Obstetrics & Gynecology | Admitting: Obstetrics & Gynecology

## 2024-06-05 ENCOUNTER — Ambulatory Visit (HOSPITAL_BASED_OUTPATIENT_CLINIC_OR_DEPARTMENT_OTHER): Payer: Medicare Other | Admitting: Obstetrics & Gynecology

## 2024-06-05 ENCOUNTER — Encounter (HOSPITAL_BASED_OUTPATIENT_CLINIC_OR_DEPARTMENT_OTHER): Payer: Self-pay | Admitting: Obstetrics & Gynecology

## 2024-06-05 ENCOUNTER — Other Ambulatory Visit (HOSPITAL_BASED_OUTPATIENT_CLINIC_OR_DEPARTMENT_OTHER): Payer: Self-pay | Admitting: Obstetrics & Gynecology

## 2024-06-05 VITALS — BP 124/87 | HR 88 | Ht 65.0 in | Wt 200.0 lb

## 2024-06-05 DIAGNOSIS — Z1331 Encounter for screening for depression: Secondary | ICD-10-CM

## 2024-06-05 DIAGNOSIS — Z124 Encounter for screening for malignant neoplasm of cervix: Secondary | ICD-10-CM | POA: Diagnosis not present

## 2024-06-05 DIAGNOSIS — N898 Other specified noninflammatory disorders of vagina: Secondary | ICD-10-CM

## 2024-06-05 DIAGNOSIS — Z01419 Encounter for gynecological examination (general) (routine) without abnormal findings: Secondary | ICD-10-CM | POA: Diagnosis not present

## 2024-06-05 DIAGNOSIS — Z9189 Other specified personal risk factors, not elsewhere classified: Secondary | ICD-10-CM

## 2024-06-05 DIAGNOSIS — L2989 Other pruritus: Secondary | ICD-10-CM

## 2024-06-05 DIAGNOSIS — Z9889 Other specified postprocedural states: Secondary | ICD-10-CM

## 2024-06-05 DIAGNOSIS — Z87440 Personal history of urinary (tract) infections: Secondary | ICD-10-CM | POA: Diagnosis not present

## 2024-06-05 DIAGNOSIS — R300 Dysuria: Secondary | ICD-10-CM

## 2024-06-05 DIAGNOSIS — D069 Carcinoma in situ of cervix, unspecified: Secondary | ICD-10-CM | POA: Diagnosis not present

## 2024-06-05 LAB — POCT URINALYSIS DIP (CLINITEK)
Bilirubin, UA: NEGATIVE
Glucose, UA: NEGATIVE mg/dL
Ketones, POC UA: NEGATIVE mg/dL
Nitrite, UA: NEGATIVE
POC PROTEIN,UA: NEGATIVE
Spec Grav, UA: 1.02 (ref 1.010–1.025)
Urobilinogen, UA: 1 U/dL
pH, UA: 8 (ref 5.0–8.0)

## 2024-06-05 MED ORDER — PHENAZOPYRIDINE HCL 100 MG PO TABS: 100.0000 mg | ORAL_TABLET | Freq: Three times a day (TID) | ORAL | 0 refills | Status: AC | PRN

## 2024-06-05 MED ORDER — CEPHALEXIN 500 MG PO CAPS: 500.0000 mg | ORAL_CAPSULE | Freq: Three times a day (TID) | ORAL | 0 refills | Status: AC

## 2024-06-05 NOTE — Addendum Note (Signed)
 Addended by: VAN MORNA SAILOR on: 06/05/2024 10:00 AM   Modules accepted: Orders

## 2024-06-05 NOTE — Progress Notes (Signed)
 Breast and Pelvic exam Patient name: Jodi [name] MRN [id number]  Date of birth: [birth year] Chief Complaint:   Breast and pelvic exam, repeat pap smear  History of Present Illness:   Jodi [name] is a 74 y.o. G1P1 Caucasian female being seen today for breast and pelvic exam.  Reports she has some thickening under bilateral breasts that was sore/swollen but this has improved.    Also had a UTI a couple of weeks ago but symptoms have improved and feel resolved.  Has done some pelvic PT.  They discussed vaginal estrogen cream use.  Wants to discuss today.  Has used prior to pap.   Having a little itching today and feels a little yeasty today.  Would like testing.    Has had some issues with an anal fissure.  Using diltiazem gel.    Has positive depression screening today.  Having cataract surgery this week.  She is very anxious about this and feels this is contributing.  Son is teaching English in [place].  She is missing him as well.    Patient's last menstrual period was 07/06/2010.  Last pap 11/22/2023. Results were: NILM w/ HRHPV negative. H/O abnormal pap: yes Last mammogram: 10/18/2023. Results were: abnormal . Patient had biopsy done on 0/25/2025 that was benign. Family h/o breast cancer: yes maternal first cousin. Last colonoscopy: 05/07/2022 Cologuard. Results were: normal. Family h/o colorectal cancer: no     06/05/2024    8:24 AM 05/24/2023    2:46 PM 08/21/2022   10:01 AM 05/21/2022   10:06 AM 05/12/2021   10:58 AM  Depression screen PHQ 2/9  Decreased Interest 1 0 0 0 0  Down, Depressed, Hopeless 1 0 0 0 0  PHQ - 2 Score 2 0 0 0 0  Altered sleeping 1      Tired, decreased energy 1      Change in appetite 0      Feeling bad or failure about yourself  1      Trouble concentrating 0      Moving slowly or fidgety/restless 0      Suicidal thoughts 0      PHQ-9 Score 5      Difficult doing work/chores Somewhat difficult        Review of Systems:   Pertinent items  are noted in HPI Denies any current urinary and bowel issues or pelvic pain.   Pertinent History Reviewed:  Reviewed past medical,surgical, social and family history.  Reviewed problem list, medications and allergies. Physical Assessment:   Vitals:   06/05/24 0823  BP: 124/87  Pulse: 88  SpO2: 98%  Weight: 200 lb (90.7 kg)  Height: 5' 5 (1.651 m)  Body mass index is 33.28 kg/m.        Physical Examination:   General appearance - well appearing, and in no distress  Mental status - alert, oriented to person, place, and time  Psych:  She has a normal mood and affect  Skin - warm and dry, normal color, no suspicious lesions noted  Chest - effort normal, all lung fields clear to auscultation bilaterally  Heart - normal rate and regular rhythm  Neck:  midline trachea, no thyromegaly or nodules  Breasts - breasts appear normal, no suspicious masses, no skin or nipple changes or  axillary nodes  Abdomen - soft, nontender, nondistended, no masses or organomegaly  Pelvic - VULVA: normal appearing vulva with no masses, tenderness or lesions   VAGINA: atrophic changes noted.  CERVIX: normal appearing cervix without discharge or lesions, no CMT  Thin prep pap is done today with HR HPV cotesting  UTERUS: uterus is felt to be normal size, shape, consistency and nontender   ADNEXA: No adnexal masses or tenderness noted.  Rectal - normal rectal, good sphincter tone, no masses felt.  Extremities:  No swelling or varicosities noted  Chaperone present for exam  No results found for this or any previous visit (from the past 24 hours).  Assessment & Plan:  1. GYN exam for high-risk Medicare patient (Primary) - Pap smear with HR HPV obtained today - Mammogram 10/18/2023 - Colonoscopy declined.  Cologuard 05/2023. - Bone mineral density 11/2022 - lab work done with PCP, Dr. Claudene - vaccines reviewed/updated  2. CIN III (cervical intraepithelial neoplasia grade III) with severe dysplasia -  Cytology - PAP( [facility])  3. H/O LEEP  4. Positive depression screening - declines treatment at this is feeling very situational to her.  5. History of UTI  6. Vaginal itching - pt will start using vaginal estradiol  cream more regularly, twice weekly.  No new rx needed.   - Cervicovaginal ancillary only( [facility])   No orders of the defined types were placed in this encounter.   Meds: No orders of the defined types were placed in this encounter.   Follow-up: No follow-ups on file.  Ronal GORMAN Pinal, MD 06/05/2024 8:59 AM

## 2024-06-06 ENCOUNTER — Ambulatory Visit (HOSPITAL_BASED_OUTPATIENT_CLINIC_OR_DEPARTMENT_OTHER): Payer: Self-pay | Admitting: Obstetrics & Gynecology

## 2024-06-06 ENCOUNTER — Ambulatory Visit (HOSPITAL_BASED_OUTPATIENT_CLINIC_OR_DEPARTMENT_OTHER): Admitting: Cardiovascular Disease

## 2024-06-06 LAB — CERVICOVAGINAL ANCILLARY ONLY
Bacterial Vaginitis (gardnerella): NEGATIVE
Candida Glabrata: POSITIVE — AB
Candida Vaginitis: NEGATIVE
Comment: NEGATIVE
Comment: NEGATIVE
Comment: NEGATIVE

## 2024-06-07 LAB — CYTOLOGY - PAP
Comment: NEGATIVE
Diagnosis: NEGATIVE
High risk HPV: NEGATIVE

## 2024-06-07 LAB — URINE CULTURE

## 2024-06-16 ENCOUNTER — Encounter (HOSPITAL_BASED_OUTPATIENT_CLINIC_OR_DEPARTMENT_OTHER): Payer: Self-pay | Admitting: Obstetrics & Gynecology

## 2024-06-16 ENCOUNTER — Encounter (HOSPITAL_BASED_OUTPATIENT_CLINIC_OR_DEPARTMENT_OTHER): Payer: Self-pay | Admitting: Certified Nurse Midwife

## 2024-06-20 ENCOUNTER — Ambulatory Visit (INDEPENDENT_AMBULATORY_CARE_PROVIDER_SITE_OTHER)

## 2024-06-20 ENCOUNTER — Encounter (HOSPITAL_BASED_OUTPATIENT_CLINIC_OR_DEPARTMENT_OTHER): Payer: Self-pay

## 2024-06-20 VITALS — BP 134/72 | HR 83 | Wt 200.0 lb

## 2024-06-20 DIAGNOSIS — R300 Dysuria: Secondary | ICD-10-CM

## 2024-06-20 DIAGNOSIS — Z87440 Personal history of urinary (tract) infections: Secondary | ICD-10-CM

## 2024-06-20 LAB — POCT URINALYSIS DIP (CLINITEK)
Bilirubin, UA: NEGATIVE
Blood, UA: NEGATIVE
Glucose, UA: NEGATIVE mg/dL
Ketones, POC UA: NEGATIVE mg/dL
Leukocytes, UA: NEGATIVE
Nitrite, UA: NEGATIVE
POC PROTEIN,UA: NEGATIVE
Spec Grav, UA: 1.025 (ref 1.010–1.025)
Urobilinogen, UA: 1 U/dL
pH, UA: 7 (ref 5.0–8.0)

## 2024-06-20 NOTE — Progress Notes (Signed)
 NURSE VISIT- UTI SYMPTOMS   SUBJECTIVE:  Jodi [name] is a 73 y.o. G1P1 female here for urine recheck. Patient reports she was taking Cephalexin  for treatment of urinary symptoms. Patient was seen at [facility] on 06/13/2024 for a rash on her abdomen and legs. She was advised to discontinue Cephalexin  at that time. Had 4 pills left. She is a GYN patient. She denies any current UTI symptoms. Reports vaginal itching.  OBJECTIVE:  BP 134/72 (BP Location: Right Arm, Patient Position: Sitting, Cuff Size: Normal)   Pulse 83   Wt 200 lb (90.7 kg)   LMP 07/06/2010   SpO2 97%   BMI 33.28 kg/m   Appears well, in no apparent distress  Results for orders placed or performed in visit on 06/20/24 (from the past 24 hours)  POCT URINALYSIS DIP ([facility]IN[facility])   Collection Time: 06/20/24 10:44 AM  Result Value Ref Range   Color, UA yellow yellow   Clarity, UA clear clear   Glucose, UA negative negative mg/dL   Bilirubin, UA negative negative   Ketones, POC UA negative negative mg/dL   Spec Grav, UA 8.974 8.989 - 1.025   Blood, UA negative negative   pH, UA 7.0 5.0 - 8.0   POC PROTEIN,UA negative negative, trace   Urobilinogen, UA 1.0 0.2 or 1.0 E.U./dL   Nitrite, UA Negative Negative   Leukocytes, UA Negative Negative    ASSESSMENT: GYN patient with UTI symptoms and negative nitrites.  PLAN: Rx sent today: No Urine culture not sent Advised to begin Boric Acid suppositories daily x14 days for treatment of candida glabrata seen on swab from 06/05/2024 as recommended by Dr.Miller. Call or return to clinic prn if these symptoms worsen or fail to improve as anticipated. Follow-up: as needed

## 2024-08-23 ENCOUNTER — Ambulatory Visit: Admitting: Physician Assistant

## 2024-09-19 ENCOUNTER — Ambulatory Visit (HOSPITAL_BASED_OUTPATIENT_CLINIC_OR_DEPARTMENT_OTHER): Admitting: Cardiovascular Disease
